# Patient Record
Sex: Male | Born: 1935
Health system: Southern US, Community
[De-identification: ages and names within clinical notes are randomized; demographics above are authoritative.]

## PROBLEM LIST (undated history)

## (undated) DIAGNOSIS — I10 Essential (primary) hypertension: Secondary | ICD-10-CM

## (undated) DIAGNOSIS — I2510 Atherosclerotic heart disease of native coronary artery without angina pectoris: Secondary | ICD-10-CM

## (undated) DIAGNOSIS — B029 Zoster without complications: Secondary | ICD-10-CM

## (undated) DIAGNOSIS — E785 Hyperlipidemia, unspecified: Secondary | ICD-10-CM

## (undated) DIAGNOSIS — R739 Hyperglycemia, unspecified: Secondary | ICD-10-CM

## (undated) DIAGNOSIS — E669 Obesity, unspecified: Secondary | ICD-10-CM

## (undated) HISTORY — DX: Hyperglycemia, unspecified: R73.9

## (undated) HISTORY — PX: KNEE ARTHROSCOPY: SUR90

## (undated) HISTORY — PX: OTHER SURGICAL HISTORY: SHX169

## (undated) HISTORY — PX: APPENDECTOMY: SHX54

---

## 2000-02-03 ENCOUNTER — Encounter: Admission: RE | Admit: 2000-02-03 | Discharge: 2000-02-03 | Payer: Self-pay | Admitting: General Surgery

## 2000-02-03 ENCOUNTER — Encounter (HOSPITAL_BASED_OUTPATIENT_CLINIC_OR_DEPARTMENT_OTHER): Payer: Self-pay | Admitting: General Surgery

## 2000-02-04 ENCOUNTER — Encounter (INDEPENDENT_AMBULATORY_CARE_PROVIDER_SITE_OTHER): Payer: Self-pay | Admitting: Specialist

## 2000-02-04 ENCOUNTER — Ambulatory Visit (HOSPITAL_BASED_OUTPATIENT_CLINIC_OR_DEPARTMENT_OTHER): Admission: RE | Admit: 2000-02-04 | Discharge: 2000-02-04 | Payer: Self-pay | Admitting: General Surgery

## 2001-07-30 ENCOUNTER — Encounter: Payer: Self-pay | Admitting: Orthopedic Surgery

## 2001-08-06 ENCOUNTER — Inpatient Hospital Stay (HOSPITAL_COMMUNITY): Admission: RE | Admit: 2001-08-06 | Discharge: 2001-08-10 | Payer: Self-pay | Admitting: Orthopedic Surgery

## 2001-08-06 ENCOUNTER — Encounter: Payer: Self-pay | Admitting: Orthopedic Surgery

## 2002-07-23 ENCOUNTER — Emergency Department (HOSPITAL_COMMUNITY): Admission: EM | Admit: 2002-07-23 | Discharge: 2002-07-23 | Payer: Self-pay | Admitting: Emergency Medicine

## 2003-04-26 DIAGNOSIS — B029 Zoster without complications: Secondary | ICD-10-CM

## 2003-04-26 HISTORY — DX: Zoster without complications: B02.9

## 2003-04-26 HISTORY — PX: TOTAL HIP ARTHROPLASTY: SHX124

## 2005-07-27 ENCOUNTER — Ambulatory Visit: Payer: Self-pay | Admitting: Internal Medicine

## 2005-08-17 ENCOUNTER — Ambulatory Visit: Payer: Self-pay | Admitting: Internal Medicine

## 2005-09-02 ENCOUNTER — Encounter: Admission: RE | Admit: 2005-09-02 | Discharge: 2005-09-02 | Payer: Self-pay | Admitting: Internal Medicine

## 2005-09-02 ENCOUNTER — Ambulatory Visit: Payer: Self-pay | Admitting: Internal Medicine

## 2005-09-27 ENCOUNTER — Ambulatory Visit: Payer: Self-pay | Admitting: Internal Medicine

## 2005-10-12 ENCOUNTER — Ambulatory Visit: Payer: Self-pay | Admitting: Internal Medicine

## 2006-01-05 ENCOUNTER — Ambulatory Visit: Payer: Self-pay | Admitting: Internal Medicine

## 2006-06-22 DIAGNOSIS — I10 Essential (primary) hypertension: Secondary | ICD-10-CM | POA: Insufficient documentation

## 2006-06-22 DIAGNOSIS — B0229 Other postherpetic nervous system involvement: Secondary | ICD-10-CM

## 2006-10-10 ENCOUNTER — Encounter: Payer: Self-pay | Admitting: Internal Medicine

## 2007-01-05 ENCOUNTER — Encounter (INDEPENDENT_AMBULATORY_CARE_PROVIDER_SITE_OTHER): Payer: Self-pay | Admitting: *Deleted

## 2007-03-16 ENCOUNTER — Telehealth: Payer: Self-pay | Admitting: Internal Medicine

## 2007-03-23 ENCOUNTER — Ambulatory Visit: Payer: Self-pay | Admitting: Internal Medicine

## 2007-03-23 DIAGNOSIS — N400 Enlarged prostate without lower urinary tract symptoms: Secondary | ICD-10-CM

## 2007-03-23 DIAGNOSIS — E782 Mixed hyperlipidemia: Secondary | ICD-10-CM | POA: Insufficient documentation

## 2007-03-27 ENCOUNTER — Encounter: Payer: Self-pay | Admitting: Internal Medicine

## 2007-04-02 ENCOUNTER — Encounter (INDEPENDENT_AMBULATORY_CARE_PROVIDER_SITE_OTHER): Payer: Self-pay | Admitting: *Deleted

## 2007-05-29 ENCOUNTER — Encounter: Payer: Self-pay | Admitting: Internal Medicine

## 2007-10-09 ENCOUNTER — Encounter: Payer: Self-pay | Admitting: Internal Medicine

## 2008-05-16 ENCOUNTER — Ambulatory Visit: Payer: Self-pay | Admitting: Family Medicine

## 2008-06-24 ENCOUNTER — Telehealth (INDEPENDENT_AMBULATORY_CARE_PROVIDER_SITE_OTHER): Payer: Self-pay | Admitting: *Deleted

## 2008-07-22 ENCOUNTER — Telehealth (INDEPENDENT_AMBULATORY_CARE_PROVIDER_SITE_OTHER): Payer: Self-pay | Admitting: *Deleted

## 2008-07-24 ENCOUNTER — Ambulatory Visit: Payer: Self-pay | Admitting: Internal Medicine

## 2008-07-24 LAB — CONVERTED CEMR LAB
Cholesterol, target level: 200 mg/dL
HDL goal, serum: 40 mg/dL
LDL Goal: 100 mg/dL

## 2009-08-03 ENCOUNTER — Telehealth (INDEPENDENT_AMBULATORY_CARE_PROVIDER_SITE_OTHER): Payer: Self-pay | Admitting: *Deleted

## 2009-12-22 ENCOUNTER — Ambulatory Visit: Payer: Self-pay | Admitting: Family Medicine

## 2009-12-22 DIAGNOSIS — M79609 Pain in unspecified limb: Secondary | ICD-10-CM

## 2009-12-22 DIAGNOSIS — L089 Local infection of the skin and subcutaneous tissue, unspecified: Secondary | ICD-10-CM | POA: Insufficient documentation

## 2009-12-22 DIAGNOSIS — B351 Tinea unguium: Secondary | ICD-10-CM

## 2009-12-23 LAB — CONVERTED CEMR LAB
BUN: 16 mg/dL (ref 6–23)
GFR calc non Af Amer: 122.82 mL/min (ref >60)
Sodium: 139 meq/L (ref 135–145)
Uric Acid, Serum: 5.6 mg/dL (ref 4.0–7.8)

## 2009-12-30 ENCOUNTER — Telehealth (INDEPENDENT_AMBULATORY_CARE_PROVIDER_SITE_OTHER): Payer: Self-pay | Admitting: *Deleted

## 2010-01-22 ENCOUNTER — Encounter: Payer: Self-pay | Admitting: Internal Medicine

## 2010-05-23 LAB — CONVERTED CEMR LAB
ALT: 24 units/L (ref 0–53)
HDL goal, serum: 40 mg/dL
Hgb A1c MFr Bld: 5.8 % (ref 4.6–6.0)
PSA: 2.52 ng/mL (ref 0.10–4.00)
Total Bilirubin: 0.8 mg/dL (ref 0.3–1.2)

## 2010-05-25 NOTE — Progress Notes (Signed)
Summary: NEEDS METOPROLOL ER 50 MG 90 DAY PRESCRIPTION  Phone Note Call from Patient Call back at Work Phone [phone]   Caller: Patient Summary of Call: PATIENT NEEDS NEW 90 DAY PRESCRIPTION PLUS REFILLS FOR METOPROLOL ER 50 MG CALLED INTO [facility] [address] IN [place]--  PLEASE CALL IN AS SOON AS POSSIBLE ---HE IS OUT AS OF TODAY Initial call taken by: Jerolyn Shin,  August 03, 2009 10:26 AM  Follow-up for Phone Call        Called  the pharmacy and left auth to refill med on voice mail, I indicated the we ok'd me on 07/27/09 and indicated patient is due for an OV. If not on file to fill. Follow-up by: Shonna Chock,  August 03, 2009 2:02 PM

## 2010-05-25 NOTE — Consult Note (Signed)
Summary: [facility]   Imported By: Lanelle Bal 02/09/2010 16:25:56  _____________________________________________________________________  External Attachment:    Type:   Image     Comment:   External Document

## 2010-05-25 NOTE — Assessment & Plan Note (Signed)
Summary: followup on bp med refill/alr   Vital Signs:  Patient profile:   78 year old male Weight:      271.2 pounds Pulse rate:   64 / minute Resp:     18 per minute BP sitting:   140 / 82  (left arm) Cuff size:   large  Vitals Entered By: Shonna Chock (July 24, 2008 4:04 PM) CC: Hypertension Management, Lipid Management Comments FOLLOW-UP ON B/P MEDS AND 90 DAY RX   CC:  Hypertension Management and Lipid Management.  History of Present Illness: He is not checking BP @ home. Last OV 03/22/2009 ; BP was 130/80. NMR reviewed ; LDL was 207 with > 255 risk. He did not keep colonoscopy referral; "I just don't want to have it" (SOC reviewed)  Hypertension History:      He complains of neurologic problems, but denies headache, chest pain, palpitations, dyspnea with exertion, orthopnea, PND, peripheral edema, visual symptoms, syncope, and side effects from treatment.  He notes no problems with any antihypertensive medication side effects.        Positive major cardiovascular risk factors include male age 4 years old or older, hyperlipidemia, and hypertension.  Negative major cardiovascular risk factors include no history of diabetes, negative family history for ischemic heart disease, and non-tobacco-user status.        Further assessment for target organ damage reveals no history of ASHD, stroke/TIA, or peripheral vascular disease.    Lipid Management History:      Positive NCEP/ATP III risk factors include male age 45 years old or older and hypertension.  Negative NCEP/ATP III risk factors include non-diabetic, no family history for ischemic heart disease, non-tobacco-user status, no ASHD (atherosclerotic heart disease), no prior stroke/TIA, no peripheral vascular disease, and no history of aortic aneurysm.      Preventive Screening-Counseling & Management     Smoking Status: never     Does Patient Exercise: no  Allergies (verified): No Known Drug Allergies  Past History:  Past  Medical History:    Hyperlipidemia    Hypertension    Post herpetic neuralgia  Past Surgical History:    RT HIP REPLACEMENT 07/2001    SEBACEOUS CYST SHOULDER( RUPTURED SUBCUTANEOUSLY)    TONSILS/ADENOIDS    Appendectomy  Family History:    Father: HTN,heart disease ONSET IN 23'S    Mother: DIED 2003/09/29 @ 70    Siblings: neg  Social History:    Occupation: Control and instrumentation engineer    Married    Never Smoked    Alcohol use-yes    Regular exercise-no; no diet    Does Patient Exercise:  no  Review of Systems Eyes:  Denies blurring, double vision, and vision loss-both eyes. ENT:  Denies difficulty swallowing and hoarseness. GI:  Denies abdominal pain, bloody stools, dark tarry stools, and indigestion. Neuro:  Denies disturbances in coordination, numbness, poor balance, and tingling; Occa aching OS @  site of distribution of shingles 2003/09/29; Lyrica of no benefit.  Physical Exam  General:  in no acute distress; alert,appropriate and cooperative throughout examination Neck:  No deformities, masses, or tenderness noted. Lungs:  Normal respiratory effort, chest expands symmetrically. Lungs are clear to auscultation, no crackles or wheezes. Heart:  Normal rate and regular rhythm. S1 and S2 normal without gallop, murmur, click, rub. S4 with slurring Abdomen:  Bowel sounds positive,abdomen soft and non-tender without masses, organomegaly or hernias noted. Pulses:  R and L carotid,radial,dorsalis pedis and posterior tibial pulses are full and equal bilaterally  Extremities:  No clubbing, cyanosis,. trace left pedal edema and trace right pedal edema.   Neurologic:  alert & oriented X3 and DTRs symmetrical and normal.   Skin:  Intact without suspicious lesions or rashes. Benign nevi Cervical Nodes:  No lymphadenopathy noted Axillary Nodes:  No palpable lymphadenopathy Psych:  memory intact for recent and remote, normally interactive, and good eye contact.     Impression & Recommendations:  Problem  # 1:  HYPERTENSION (ICD-401.9)  His updated medication list for this problem includes:    Toprol Xl 50 Mg Tb24 (Metoprolol succinate) .Marland Kitchen... 1/2 tab once daily    Hydrochlorothiazide 12.5 Mg Caps (Hydrochlorothiazide) .Marland Kitchen... 1 once daily  Problem # 2:  HYPERLIPIDEMIA (ICD-272.2)  Problem # 3:  POSTHERPETIC NEURALGIA (ICD-053.19)  Complete Medication List: 1)  Toprol Xl 50 Mg Tb24 (Metoprolol succinate) .... 1/2 tab once daily 2)  Baby Asa  .... Once daily 3)  Multivitamin  .... Once daily 4)  Gabapentin 300 Mg Caps (Gabapentin) .Marland Kitchen.. 1 -2 at bedtime for herpetic neuralgia 5)  Hydrochlorothiazide 12.5 Mg Caps (Hydrochlorothiazide) .Marland Kitchen.. 1 once daily  Hypertension Assessment/Plan:      The patient's hypertensive risk group is category B: At least one risk factor (excluding diabetes) with no target organ damage.  Today's blood pressure is 140/82.    Lipid Assessment/Plan:      Based on NCEP/ATP III, the patient's risk factor category is "2 or more risk factors and a calculated 10 year CAD risk of > 20%".  The patient's lipid goals have been set as follows: Total cholesterol goal is 200; LDL cholesterol goal is 100; HDL cholesterol goal is 40; Triglyceride goal is 150.  His LDL cholesterol goal has not been met.  Secondary causes for hyperlipidemia have been ruled out.  He has been counseled on adjunctive measures for lowering his cholesterol and has been provided with dietary instructions.    Patient Instructions: 1)  Check your Blood Pressure regularly. If it is above:  135/85 ON AVERAGE you should make an appointment. Please consider meds for LDL risk, your LDL goal = < 100 (last value was 207). Please consider fasting labs: lipids, A1c, BUN,creat,K+( 272.4,277.7,401.9). Consider Shingles shot Prescriptions: TOPROL XL 50 MG TB24 (METOPROLOL SUCCINATE) 1/2 tab once daily  #90 x 1   Entered and Authorized by:   Marga Melnick MD   Signed by:   Marga Melnick MD on 07/24/2008   Method used:    Print then Give to Patient   RxID:   [id number] HYDROCHLOROTHIAZIDE 12.5 MG CAPS (HYDROCHLOROTHIAZIDE) 1 once daily  #90 x 1   Entered and Authorized by:   Marga Melnick MD   Signed by:   Marga Melnick MD on 07/24/2008   Method used:   Print then Give to Patient   RxID:   [id number] GABAPENTIN 300 MG CAPS (GABAPENTIN) 1 -2 at bedtime for herpetic neuralgia  #180 x 1   Entered and Authorized by:   Marga Melnick MD   Signed by:   Marga Melnick MD on 07/24/2008   Method used:   Print then Give to Patient   RxID:   [id number]

## 2010-05-25 NOTE — Progress Notes (Signed)
Summary: refill 30 day supply  Phone Note Refill Request Message from:  Patient on December 30, 2009 10:12 AM  Refills Requested: Medication #1:  GABAPENTIN 300 MG CAPS 1 -2 at bedtime for herpetic neuralgia [facility] [address] - patient got rx for 90 day supply - but this is not on [facility] 4.00 list - patient doesnt want to get filled - wants rx for 30 day supply   Initial call taken by: Okey Regal [place],  December 30, 2009 10:14 AM    Prescriptions: GABAPENTIN 300 MG CAPS (GABAPENTIN) 1 -2 at bedtime for herpetic neuralgia  #30 x 0   Entered by:   Shonna Chock CMA   Authorized by:   Marga Melnick MD   Signed by:   Shonna Chock CMA on 12/30/2009   Method used:   Electronically to        [facility]  [phone]* (retail)       [address]       [place], [place]  [address]       Ph: [phone] or [phone]       Fax: [phone]   RxID:   [id number]

## 2010-05-25 NOTE — Assessment & Plan Note (Signed)
Summary: infected toe/kn   Vital Signs:  Patient profile:   77 year old male Weight:      269 pounds Temp:     98.7 degrees F oral Pulse rate:   64 / minute Pulse rhythm:   regular BP sitting:   136 / 92  (right arm)  Vitals Entered By: Almeta Monas CMA Duncan Dull) (December 22, 2009 11:30 AM) CC: c/o infected right big toe   History of Present Illness: Pt here c/o red and painful R toe.   No hx gout.     Current Medications (verified): 1)  Toprol Xl 50 Mg Tb24 (Metoprolol Succinate) .... 1/2 Tab Once Daily. Appointment Due 2)  Alinda Money .... Once Daily 3)  Multivitamin .... Once Daily 4)  Gabapentin 300 Mg Caps (Gabapentin) .Marland Kitchen.. 1 -2 At Bedtime For Herpetic Neuralgia 5)  Hydrochlorothiazide 12.5 Mg Caps (Hydrochlorothiazide) .Marland Kitchen.. 1 Once Daily 6)  Keflex 500 Mg Caps (Cephalexin) .Marland Kitchen.. 1 By Mouth Two Times A Day  Allergies (verified): No Known Drug Allergies  Past History:  Past medical, surgical, family and social histories (including risk factors) reviewed for relevance to current acute and chronic problems.  Past Medical History: Reviewed history from 07/24/2008 and no changes required. Hyperlipidemia Hypertension Post herpetic neuralgia  Past Surgical History: Reviewed history from 07/24/2008 and no changes required. RT HIP REPLACEMENT 07/2001 SEBACEOUS CYST SHOULDER( RUPTURED SUBCUTANEOUSLY) TONSILS/ADENOIDS Appendectomy  Review of Systems      See HPI  Impression & Recommendations:  Problem # 1:  INFECTION, TOE (ICD-686.9) abx written soak in H202 check uric acid---r/o gout Orders: Podiatry Referral Personal assistant) Prescription Created Electronically [phone])  Problem # 2:  ONYCHOMYCOSIS, TOENAILS (ICD-110.1)  Orders: Podiatry Referral (Podiatry)  Discussed nail care and medication treatment options.   Complete Medication List: 1)  Toprol Xl 50 Mg Tb24 (Metoprolol succinate) .... 1/2 tab once daily. appointment due 2)  Baby Asa  .... Once daily 3)   Multivitamin  .... Once daily 4)  Gabapentin 300 Mg Caps (Gabapentin) .Marland Kitchen.. 1 -2 at bedtime for herpetic neuralgia 5)  Hydrochlorothiazide 12.5 Mg Caps (Hydrochlorothiazide) .Marland Kitchen.. 1 once daily 6)  Keflex 500 Mg Caps (Cephalexin) .Marland Kitchen.. 1 by mouth two times a day  Other Orders: Venipuncture (60454) TLB-BMP (Basic Metabolic Panel-BMET) (80048-METABOL) TLB-Uric Acid, Blood (84550-URIC)  Physical Exam  General:  Well-developed,well-nourished,in no acute distress; alert,appropriate and cooperative throughout examination Msk:  R toe-- + errythema, tenderness to palpation nail thick and yellow ,  no drainage Psych:  Cognition and judgment appear intact. Alert and cooperative with normal attention span and concentration. No apparent delusions, illusions, hallucinations  ]Prescriptions: KEFLEX 500 MG CAPS (CEPHALEXIN) 1 by mouth two times a day  #20 x 0   Entered and Authorized by:   Loreen Freud DO   Signed by:   Loreen Freud DO on 12/22/2009   Method used:   Electronically to        [facility]. [phone]* (retail)       [address]       [place], [place]  [address]       Ph: [phone]       Fax: [phone]   RxID:   [id number]

## 2010-06-09 ENCOUNTER — Encounter: Payer: Self-pay | Admitting: Internal Medicine

## 2010-06-09 ENCOUNTER — Ambulatory Visit (INDEPENDENT_AMBULATORY_CARE_PROVIDER_SITE_OTHER): Payer: Medicare Other | Admitting: Internal Medicine

## 2010-06-09 DIAGNOSIS — I10 Essential (primary) hypertension: Secondary | ICD-10-CM

## 2010-06-09 DIAGNOSIS — J069 Acute upper respiratory infection, unspecified: Secondary | ICD-10-CM | POA: Insufficient documentation

## 2010-06-09 DIAGNOSIS — J209 Acute bronchitis, unspecified: Secondary | ICD-10-CM

## 2010-06-16 NOTE — Assessment & Plan Note (Signed)
Summary: cough,drainage/cbs   Vital Signs:  Patient profile:   77 year old male Weight:      270 pounds O2 Sat:      95 % on Room air Temp:     98.2 degrees F oral Pulse rate:   73 / minute Resp:     14 per minute BP sitting:   150 / 90  (left arm)  Vitals Entered By: Jeremy Johann CMA (June 09, 2010 12:47 PM)  O2 Flow:  Room air CC: dry cough, head cold, URI symptoms   Primary Care Provider:  hopp  CC:  dry cough, head cold, and URI symptoms.  History of Present Illness:    Onset 06/01/2010 as head congestion, rhinitis & NP cough; he now  reports nasal congestion and  persistent dry cough, but denies purulent nasal discharge and earache.  The patient denies fever( but having night sweats), dyspnea, and wheezing.  The patient also reports  frontal headache.  Risk factors for Strep sinusitis include bilateral facial pain.  The patient denies the following risk factors for Strep sinusitis: tooth pain and tender adenopathy.Rib pain & disturbed sleep from cough.  Rx: Coricidin HP. No PMH of asthma; never smoked .    See BP; he is not on HCTZ as  per  in Med List.  The patient denies lightheadedness, urinary frequency, edema, and fatigue.  The patient denies the following associated symptoms: chest pain, chest pressure, palpitations, and syncope.  Adjunctive measures currently used by the patient include salt restriction. BP not checked @ home, it was 140/84 @ [facility].   Current Medications (verified): 1)  Toprol Xl 50 Mg Tb24 (Metoprolol Succinate) .... 1/2 Tab Once Daily. Appointment Due 2)  Alinda Money .... Once Daily 3)  Multivitamin .... Once Daily  Allergies (verified): No Known Drug Allergies  Physical Exam  General:  well-nourished,in no acute distress; alert,appropriate and cooperative throughout examination Ears:  External ear exam shows no significant lesions or deformities.  Otoscopic examination reveals clear canals, tympanic membranes are intact bilaterally without  bulging, retraction, inflammation or discharge. Hearing is grossly normal bilaterally. Nose:  External nasal examination shows no deformity or inflammation. Nasal mucosa are  dry without lesions or exudates. Mouth:  Oral mucosa and oropharynx without lesions or exudates.  Teeth in good repair. Lungs:  Normal respiratory effort, chest expands symmetrically. Lungs are clear to auscultation, no crackles or wheezes. Heart:  Normal rate and regular rhythm. S1 and S2 normal without gallop, murmur, click, rub .S4 Pulses:  R and L carotid,radial,dorsalis pedis and posterior tibial pulses are full and equal bilaterally Extremities:  trace left  & R pedal edema @ sock line.   Neurologic:  alert & oriented X3.   Cervical Nodes:  No lymphadenopathy noted Axillary Nodes:  No palpable lymphadenopathy Psych:  memory intact for recent and remote, normally interactive, and good eye contact.     Impression & Recommendations:  Problem # 1:  BRONCHITIS-ACUTE (ICD-466.0)  His updated medication list for this problem includes:    Amoxicillin 500 Mg Caps (Amoxicillin) .Marland Kitchen... 1 three times a day    Hydromet 5-1.5 Mg/76ml Syrp (Hydrocodone-homatropine) .Marland Kitchen... 1 tsp every 6 hrs as needed cough  Orders: Prescription Created Electronically [phone])  Problem # 2:  URI (ICD-465.9)  His updated medication list for this problem includes:    Hydromet 5-1.5 Mg/57ml Syrp (Hydrocodone-homatropine) .Marland Kitchen... 1 tsp every 6 hrs as needed cough  Problem # 3:  HYPERTENSION (ICD-401.9)  suboptimal control ; risk  discussed The following medications were removed from the medication list:    Hydrochlorothiazide 12.5 Mg Caps (Hydrochlorothiazide) .Marland Kitchen... 1 once daily His updated medication list for this problem includes:    Toprol Xl 50 Mg Tb24 (Metoprolol succinate) .Marland Kitchen... 1/2 tab once daily. appointment due    Hydrochlorothiazide 12.5 Mg Caps (Hydrochlorothiazide) .Marland Kitchen... 1 once daily  Orders: Prescription Created Electronically  [phone])  Complete Medication List: 1)  Toprol Xl 50 Mg Tb24 (Metoprolol succinate) .... 1/2 tab once daily. appointment due 2)  Baby Asa  .... Once daily 3)  Multivitamin  .... Once daily 4)  Hydrochlorothiazide 12.5 Mg Caps (Hydrochlorothiazide) .Marland Kitchen.. 1 once daily 5)  Amoxicillin 500 Mg Caps (Amoxicillin) .Marland Kitchen.. 1 three times a day 6)  Hydromet 5-1.5 Mg/46ml Syrp (Hydrocodone-homatropine) .Marland Kitchen.. 1 tsp every 6 hrs as needed cough  Patient Instructions: 1)  Neti pot once daily as needed for head congestion. 2)  Drink as much NON dairy fluid as you can tolerate for the next few days. 3)  Check your Blood Pressure regularly. If it is above: 135/85 ON AVERAGE  you should make an appointment. Prescriptions: HYDROMET 5-1.5 MG/5ML SYRP (HYDROCODONE-HOMATROPINE) 1 tsp every 6 hrs as needed cough  #120 cc x 0   Entered and Authorized by:   Marga Melnick MD   Signed by:   Marga Melnick MD on 06/09/2010   Method used:   Printed then faxed to ...       [facility]  [place]. [phone]* (retail)       [address]       [place], [place]  [address]       Ph: [phone]       Fax: [phone]   RxID:   [id number] AMOXICILLIN 500 MG CAPS (AMOXICILLIN) 1 three times a day  #30 x 0   Entered and Authorized by:   Marga Melnick MD   Signed by:   Marga Melnick MD on 06/09/2010   Method used:   Electronically to        [facility]. [phone]* (retail)       [address]       [place], [place]  [address]       Ph: [phone]       Fax: [phone]   RxID:   [id number] HYDROCHLOROTHIAZIDE 12.5 MG CAPS (HYDROCHLOROTHIAZIDE) 1 once daily  #90 x 1   Entered and Authorized by:   Marga Melnick MD   Signed by:   Marga Melnick MD on 06/09/2010   Method used:   Electronically to        [facility]. [phone]* (retail)       [address]       [place], [place]  [address]       Ph: [phone]       Fax:  [phone]   RxID:   [id number]    Orders Added: 1)  Est. Patient Level IV [71245] 2)  Prescription Created Electronically [id number]

## 2010-07-27 ENCOUNTER — Other Ambulatory Visit: Payer: Self-pay | Admitting: *Deleted

## 2010-07-27 MED ORDER — METOPROLOL TARTRATE 25 MG PO TABS: 25.0000 mg | ORAL_TABLET | Freq: Two times a day (BID) | ORAL | Status: DC

## 2010-07-27 NOTE — Telephone Encounter (Signed)
Aware rx sent to pharmacy 

## 2010-08-16 ENCOUNTER — Telehealth: Payer: Self-pay | Admitting: Internal Medicine

## 2010-08-16 MED ORDER — METOPROLOL TARTRATE 25 MG PO TABS: 25.0000 mg | ORAL_TABLET | Freq: Two times a day (BID) | ORAL | Status: DC

## 2010-08-16 NOTE — Telephone Encounter (Signed)
RX sent to the pharmacy, I reviewed previous refill and it looks like rx was sent to [facility] [address] [place]. RX is now at the correct pharmacy. Left message on voicemail informing patient rx sent to the pharmacy

## 2010-08-16 NOTE — Telephone Encounter (Signed)
Patient's wife is calling to get a 90 day prescription for the "non-extended release" for Metoprolol---says he was taking Metoprolol XR 50mg    and it costs $165.00---wants to get the same 90 day  medication, BUT NOT the extended release because it will be a significant cost savings---  wife says she has been calling about this request for a week and patient is now out of medication as of today---please send new prescription to [facility] [address]  Because she has been calling about this and making trips to [place], she would like [place] to call her when prescription is called in

## 2010-08-17 ENCOUNTER — Other Ambulatory Visit: Payer: Self-pay | Admitting: *Deleted

## 2010-09-08 ENCOUNTER — Other Ambulatory Visit: Payer: Self-pay | Admitting: Internal Medicine

## 2010-09-08 NOTE — Telephone Encounter (Signed)
Patient will need OV in order to refill antibiotic, this is not a refillable medication

## 2010-09-10 NOTE — Op Note (Signed)
[facility]  Patient:    Wyatt Mason, GALLERY Visit Number: [id number] MRN: [id number]          Service Type: SUR Location: [id number] Attending Physician:  Loanne Drilling Dictated by:   Ollen Gross, M.D. Proc. Date: 08/06/01 Admit Date:  08/06/2001                             Operative Report  PREOPERATIVE DIAGNOSIS:  Osteoarthritis, right hip.  POSTOPERATIVE DIAGNOSIS:  Osteoarthritis, right hip.  PROCEDURE:  Right total hip arthroplasty.  SURGEON:  Ollen Gross, M.D.  ASSISTANT:  Marcie Bal. Troncale, P.A.C.  ANESTHESIA:  General.  ESTIMATED BLOOD LOSS:  900  DRAINS:  Hemovac x1.  COMPLICATIONS:  None.  CONDITION:  Stable to recovery.  BRIEF CLINICAL NOTE:  Mr. Odea is a 77 year old male with severe osteoarthritis of his right hip with pain refractory to nonoperative management. He presents now for a right total hip arthroplasty after failure of nonoperative management.  DESCRIPTION OF PROCEDURE:  After successful administration of general anesthetic, the patient was placed in the left lateral decubitus position with the right side up and held with the hip positioner. The right lower extremity was isolated from his perineum was plastic drapes and prepped and draped in the usual sterile fashion. A standard posterolateral incision was made with a 10 bladder through the subcutaneous tissue to the level of the fascia lata which was incised in line with the skin incision. The sciatic nerve was palpated and protected and short external rotators isolated off the femur. The capsulectomy is then performed and the hip dislocated. The center of the femoral head is marked and trial prosthesis placed such that the center of the trial head corresponds to the center of his native femoral head. The femur is then retracted anteriorly and acetabular exposure obtained.  The soft tissue is cleared from the acetabulum and osteophytes  removed. Acetabular reaming is initiated with 50 coursing in increments of 2 from 51 through 59. A 60 mm pinnacle acetabular shell is placed in approximately 40 degrees of abduction, 20 degrees of anteversion. The shell has an excellent fit and is then supported with two additional dome screws with excellent purchase. This was then irrigated and a trial 32 plus 4 neutral liner is placed.  The femur is then prepared first with a canal finder and then the canal is irrigated. Axial reaming is performed with a 15.5 mm proximal reaming up to a 44F and the sleeve machined to a small. A 44F small sleeve is placed. The trial 20 x 15 stem with a 36 plus 8 neck is placed. It was placed in approximately 20 degrees of anteversion. The trial 32 plus 0 head is placed and it felt as though there was too much soft tissue laxity, thus I went with a 32 plus 6 head. This tightened up the soft tissues very nicely. He had full extension, full external rotation, 70 degrees flexion, 40 degrees adduction, 90 degrees internal rotation and 90 degrees flexion and 70 degrees internal rotation. All trials were then removed and the permanent apex hole eliminator placed into the acetabular shell. Subsequently the 32 mm neutral plus 4 marathon liner is placed into the shell. On the femur, the 44F large sleeve is placed and the 20 x 15 stem with a 36 plus 8 neck is placed and a 32 plus 6 head is placed. The hip is reduced with the  same stability perimeters. The wound was copiously irrigated with antibiotic solution and short external rotators reattached to the femur through drill holes. The fascia lata was closed over a Hemovac drain with interrupted #1 Vicryl, the subcu closed with interrupted #1 and interrupted 2-0 Vicryl, subcuticular with running 4-0 monocryl. The incision was cleaned and dried and Steri-Strips and a bulky sterile dressing applied. The patient was awakened and transported to recovery in stable  condition. Dictated by:   Ollen Gross, M.D. Attending Physician:  Loanne Drilling DD:  08/06/01 TD:  08/06/01 Job: [id number]

## 2010-09-10 NOTE — Consult Note (Signed)
[facility]  Patient:    Wyatt Mason Visit Number: [id number] MRN: [id number]          Service Type: Attending:  Titus Dubin. Alwyn Mason, M.D. [facility] Dictated by:   Wyatt Mason, M.D. [facility] Proc. Date: 07/24/01   CC:         Wyatt Mason, M.D.   Consultation Report  DATE OF BIRTH:  Sep 08, 2032  SOCIAL SECURITY NUMBER:  [id number].  REASON FOR CONSULTATION:  Wyatt Mason is a 77 year old white male who was seen on 07/23/01, referred by Dr. Ollen Mason for preoperative clearance.  A right hip replacement is scheduled for August 06, 2001, at [facility].  He has had hip pain for 10 to 12 years, aggravated by his driving of over 24,401 miles per year.  The pain has incapacitated him to the point that he is sedentary, resulting in some weight gain.  He was previously on aspirin one to two times per day without benefit.  PAST MEDICAL HISTORY: 1. Epidermal inclusion cyst removal from the shoulder after it ruptured. 2. Tonsillectomy and adenoidectomy. 3. Appendectomy.  FAMILY HISTORY:  Myocardial infarction, hypertension in his father. Apparently his father had heart disease beginning in his 74s, suggesting congenital heart disease.  There is no family history of cancer or diabetes or stroke.  SOCIAL HISTORY:  He has never smoked.  He drinks occasionally.  As stated, he is sedentary due to his hip pain.  CURRENT MEDICATIONS:  He is on no prescription drugs at this time, but does take multivitamins.  ALLERGIES:  No known drug allergies.  REVIEW OF SYSTEMS:  Reveals some post nasal drainage.  He checked his blood pressures at home and they range from 135 to 140/85.  He denies any epistaxis, headache, chest pain, palpitations, or shortness of breath.  He has had no preventive health care since 1999.  PHYSICAL EXAMINATION:  GENERAL:  He is 6 feet 1.5 inches, and weighs 248 pounds. VITAL SIGNS:  Temperature was 97.5, pulse 68, respiratory  rate 17, and blood pressure 150/86.  HEENT:  He does have ______ narrowing on fundal exam.  Pattern alopecia is present.  The otolaryngological examination is unremarkable.  NECK:  Thyroid is small without nodularity.  CHEST:  Clear.  He has a grade 1/2 systolic murmur.  No bruits, aneurysms, or pulsatile clicks were noted.  He had no edema.  ABDOMEN:  Protuberant.  Fecal occult blood testing was negative.  No lymphadenopathy or organomegaly.  GENITOURINARY:  He does have a hydrocele on the left.  The prostate is normal to palpation.  EXTREMITIES:  He had pain with walking.  The pain was exacerbated by any elevation of the right lower extremity or rotation of the hip.  SKIN:  Clear.  Because of hypertension, Toprol XL 50 mg 1/2 daily was initiated.  In reviewing the old records, he has had significant hyperlipidemia, specifically, his total cholesterol was 269 with LDL of 185.  The HDL was protected at 64.  While hospitalized, I would recommend a nutrition consultation concerning lipid management.  Because of the hypertension, I have recommended preoperative BUN, creatinine, and potassium.  Fasting glucose would be recommended.  Additionally, PSA is recommended as he has had no lab followup since 1999.  EKG revealed no dysrhythmias, but did reveal a first degree block with a PR interval of 0.208.  No left ventricular hypertrophy noted.  A copy of this was given to him to take to Dr. Lequita Mason.  Based on these  findings, there is no contraindication to the surgery.  It is recommended that for completeness of other studies as recommended above be performed. Dictated by:   Wyatt Mason, M.D. [facility] Attending:  Titus Dubin. Alwyn Mason, M.D. [facility] DD:  07/24/01 TD:  07/24/01 Job: [id number]

## 2010-09-10 NOTE — Discharge Summary (Signed)
[facility]  Patient:    Wyatt Mason, Wyatt Mason Visit Number: [id number] MRN: [id number]          Service Type: SUR Location: 4W 0452 01 Attending Physician:  Loanne Drilling Dictated by:   Sammuel Cooper Mahar, P.A. Admit Date:  08/06/2001 Discharge Date: 08/10/2001                             Discharge Summary  DATE OF BIRTH:  [birth year]  ADMITTING DIAGNOSES: 1. Severe osteoarthritis of the right hip. 2. Hypertension. 3. Seasonal allergies.  DISCHARGE DIAGNOSES: 1. Status post right total hip arthroplasty. 2. Postoperative hemorrhagic anemia that was asymptomatic and did not require    transfusion. 3. Postoperative febrile illness that was resolving prior to discharge, likely    secondary to atelectasis. 4. Hypertension. 5. Seasonal allergies.  PROCEDURE:  Right total hip arthroplasty done by Dr. Ollen Gross with the assistance of Irena Cords, P.A.-C.  Anesthesia used was general.  CONSULTATIONS:  None.  BRIEF HISTORY:  The patient is a 77 year old man with an 8-10 year history of right hip pain that has gotten progressively worse and is mainly posterolateral and in the groin.  It does radiate down to his knee.  He states his pain is interfering with his activities of daily living prior to surgery, as well as his ability to do activities of joy.  He has gotten to the point where the pain was ______ in nature and was not acceptable.  He wanted to have operative intervention.  Risks and benefits were discussed with the patient.  He indicated understanding and wished to proceed.  LABORATORY DATA:  On July 30, 2001, CBC with differential was within normal limits.  Hemoglobin and hematocrit were monitored postoperatively.  They did hit a low of 8.5 and 24.7 on August 10, 2001; however, he was asymptomatic and did not require any blood transfusions throughout.  PT/INR and PTT from July 30, 2001 were within normal limits.  PT and INR were  monitored postoperatively while on Coumadin.  They were gradually increasing until discharge.  On August 10, 2001, they were 17.4 and 1.6, respectively.  Complete metabolic panel from July 30, 2001 was within normal limits.  Basic metabolic panel from April 15 and April 16:  Sodium was 132 on April 15 and returned to normal at 136 on April 16.  Glucose was 150 on April 15 and April 16 glucose was 150 and 130, respectively.  Calcium was 7.6 and 7.8, respectively. Otherwise, BMETs were within normal limits.  UA from July 30, 2001 was negative.  August 06, 2001 blood type O, Rh type positive, antibody screen negative.  X-rays from April 7 of the chest revealed negative chest reactive disease. Pelvis revealed marked degenerative changes of the right hip with almost complete obliteration of the articular space secondary to changes noted in the femoral head including a subchondral cyst.  There is some slight degenerative change in the left hip but much less striking than on the right.  Views of the right hip showed marked osteoarthritis of the right hip.  August 06, 2001 pelvis and right hip showed right total hip replacement felt to be in satisfactory position and alignment.  EKG:  No EKG seen on the chart.  HOSPITAL COURSE:  On August 06, 2001, the patient was admitted and taken to the operating room for the above-listed procedure.  He tolerated the procedure very well without  any intraoperative complications.  There was one Hemovac drain placed.  He was transferred to the recovery room in stable and improved condition.  Postoperatively, he was started on appropriate antibiotic course and completed this without any difficulty.  His diet was advanced to regular without any problems.  Bedside incentive spirometer was utilized q.1h. in attempt to prevent any pulmonary complications.  Neurovascular checks were instituted postoperatively and remained intact throughout his hospital stay.  Pain  control was initially gained utilizing a combination of PCA morphine as well as p.o. analgesics.  He was transitioned over to strictly p.o. analgesics from postoperative day #1 to postoperative day #2.  Pain control did remain adequate.  Physical therapy and occupational therapy were consulted to work with the patient on total hip protocol, as well as precautions.  He did very well with them on touchdown weightbearing limitations, gradually increasing to ambulating approximately 85 feet with only supervision prior to discharge.  He indicated and demonstrated understanding of his total hip precautions as well.  On postoperative day #1, the patients Hemovac drain was discontinued without any difficulty intact.  On postoperative day #2, the patients operative dressing was taken down to reveal a good-looking incision without any signs or symptoms of infection.  The patient did develop a temperature of 102.8 on postoperative day #2.  Incentive spirometer was ordered, encouraged, and increased.  Throughout the next two days, his temperature did gradually decrease and it was near normal prior to discharge.  The patients operative dressing was taken down on postoperative day #2 that revealed a good-looking incision without any signs or symptoms of infection.  Daily dressing changes were done thereafter and continued to look good without any problems.  By August 10, 2001, the patient was orthopedically stable and medically stable and ready for discharge to his home.  His vital signs were stable and the incision looked good without any signs or symptoms of infection.  He was neurovascularly intact.  Motor and function and sensation were intact.  DISCHARGE MEDICATIONS: 1. Percocet 5/325. 2. Robaxin 500 mg. 3. Coumadin dose per pharmacy. 4. Trinsicon.  ACTIVITY:  Touchdown weightbearing to the right lower extremity.  Total hip precautions, as well as protocol.  Home health physical therapy  and occupational therapy and nursing.  Daily dressing changes should be done.  The patient may shower on postoperative day #5 or when there is no drainage.   FOLLOWUP:  Followup two weeks postoperatively with Dr. Ollen Gross.  He was instructed to call for an appointment.  DISPOSITION:  The patient is being discharged to his home with home health services per [place].  Hemoglobin will be drawn at home on April 21 and the results will be faxed to Dr. Ollen Gross.  CONDITION ON DISCHARGE:  Stable and improved. Dictated by:   Sammuel Cooper. Mahar, P.A. Attending Physician:  Loanne Drilling DD:  08/16/01 TD:  08/16/01 Job: [phone] [id number]

## 2010-09-10 NOTE — H&P (Signed)
[facility]  Patient:    Wyatt Mason, Wyatt Mason Visit Number: [id number] MRN: [id number]          Service Type: Attending:  Ollen Gross, M.D. Dictated by:   Dorie Rank, P.A. Adm. Date:  07/30/01   CC:         Titus Dubin. Alwyn Ren, M.D. [facility]   History and Physical  DATE OF BIRTH:  1932-06-14.  CHIEF COMPLAINT:  Right hip pain.  HISTORY OF PRESENT ILLNESS:  The patient is a pleasant 77 year old male with a 8-10 year history of right hip pain that has gotten progressively worse.  It is mainly posterolateral and in the groin.  It does radiate down to his knee. He states that the pain does interfere with activities of daily living as well as the ability to do activities that he enjoys.  He is at a stage now where presented to the office stating that he would like to get something done.  PHYSICAL EXAMINATION:  He walks with an antalgic gait.  Range of motion was limited with flexion only to 80 degrees.  No internal rotation, 10 degrees or external rotation, and 20 degrees of abduction.  He had crepitance noted on range of motion.  Radiographs in the office from July 17, 2000, revealed severe bone-on-bone changes with shortening of approximately 3/8th of an inch compared to the office ______.  It was felt due to his ongoing pain diagnostic studies, physical examination, he will benefit from underlying a right total hip arthroplasty.  The risks and benefits of those procedures were discussed with the patient and he elected to proceed.  He obtained medical clearance from his medical doctor, Dr. Marga Melnick.  ALLERGIES:  No known drug allergies.  MEDICATIONS:  Toprol XL 25 mg 1 p.o. q. day.  PAST MEDICAL HISTORY:  Significant for osteoarthritis and hypertension.  He has otherwise been in relatively good health.  SOCIAL HISTORY:  The patient is married.  He is a self employed Engineer, materials.  He lives in a 2 level home with the bedroom being on  the first floor.  He plans for home health PT.  He is a nonsmoker.  He drinks approximately 1-3 drinks per week.  His wife and son will be available after surgery to care for him.  FAMILY HISTORY:  Father deceased age 71.  History of coronary artery disease and myocardial infarction.  Mother is living at age 64.  He states she is in good health with osteoarthritis.  PAST SURGICAL HISTORY: 1. Tonsillectomy as a child. 2. Appendectomy as a child. 3. Cyst removed from his left shoulder in October 2001.  REVIEW OF SYSTEMS:  General:   No fevers, chills, night sweats or bleeding tendencies.  He has had some sneezing related to his seasonal allergies. Pulmonary:  No shortness of breath, productive cough, hemoptysis. Cardiovascular:  No chest pain, angina, or orthopnea.  Endocrine:  No history of hyper or hypothyroidism.  No history of diabetes mellitus.  GI:  No nausea, vomiting, diarrhea, melena or constipation.  GU:  No hematuria, dysuria, or discharge.  Neurologic:  No seizures, headaches, or paralysis. Musculoskeletal:  Significant for right hip pain, otherwise negative.  PHYSICAL EXAMINATION:  GENERAL:  Alert, oriented, well-developed, well-nourished 77 year old male.  VITAL SIGNS:  Pulse 65, respirations 16, blood pressure 140/85.  HEENT:  Head is atraumatic, normocephalic.  Oropharynx is clear.  NECK:  Supple.  Negative for carotid bruits bilaterally.  CHEST:  Clear to auscultation bilaterally.  No  wheezes, rhonchis or rales.  BREASTS:  Not pertinent to present illness.  HEART:  S1, S2.  Negative for murmur, rub or gallop.  Regular in rate and rhythm.  ABDOMEN:  Soft, nontender, positive bowel sounds.  GENITOURINARY:  Not pertinent to present illness.  EXTREMITIES:  Please see physical examination for physical examination of the right hip.  SKIN:  Intact, no rashes or lesions present on examination.  PULSES:  Dorsalis pedis pulses 2+ and symmetrical.  Posterior  tibialis pulses 1+ and symmetrical.  PREOPERATIVE LABORATORY DATA AND X-RAYS:  Pending.  IMPRESSION: 1. Osteoarthritis right hip. 2. Hypertension. 3. Seasonal allergies.  PLAN:  The patient is scheduled for a right total hip arthroplasty with Dr. Trudee Grip.  His family medical doctor is Dr. Marga Melnick. Dictated by:   Dorie Rank, P.A. Attending:  Ollen Gross, M.D. DD:  07/30/01 TD:  07/30/01 Job: [id number]

## 2010-09-10 NOTE — Op Note (Signed)
[facility]  Patient:    RONNELL, CLINGER                      MRN: [id number] Proc. Date: 02/04/00 Adm. Date:  [id number] Attending:  Fortino Sic                           Operative Report  PREOPERATIVE DIAGNOSIS:  Large sebaceous cyst - back.  POSTOPERATIVE DIAGNOSIS:  Large sebaceous cyst - back.  OPERATION PERFORMED:  Excision large sebaceous cyst.  SURGEON:  Marnee Spring. Wiliam Ke, M.D.  ASSISTANT:  None.  ANESTHESIA:  Local MAC.  PROCEDURE:  The patient well-sedated, lying on his right side.  Skin of the back was prepped and draped in usual manner. Tissues were infiltrated with a mixture of Xylocaine and Marcaine anesthesia.  An excision of skin and sebaceous cyst was made approximately 6 cm x 3 cm down to the fascia of the back muscles.  Hemostasis was obtained with electrocautery current.  The defect was then closed with subcutaneous 3-0 Vicryl and vertical mattress sutures of 3-0 nylon.  Estimated blood loss was minimal.  The patient received no blood and left the operating room in satisfactory condition after sponge and needle counts were verified. DD:  02/04/00 TD:  02/05/00 Job: [id number]

## 2010-12-14 ENCOUNTER — Other Ambulatory Visit: Payer: Self-pay | Admitting: Internal Medicine

## 2011-01-28 ENCOUNTER — Other Ambulatory Visit: Payer: Self-pay

## 2011-01-28 MED ORDER — METOPROLOL TARTRATE 25 MG PO TABS: 25.0000 mg | ORAL_TABLET | Freq: Two times a day (BID) | ORAL | Status: DC

## 2011-05-04 ENCOUNTER — Encounter: Payer: Self-pay | Admitting: Internal Medicine

## 2011-05-04 ENCOUNTER — Ambulatory Visit (INDEPENDENT_AMBULATORY_CARE_PROVIDER_SITE_OTHER): Payer: Medicare Other | Admitting: Internal Medicine

## 2011-05-04 VITALS — BP 138/90 | HR 73 | Temp 98.1°F | Wt 277.6 lb

## 2011-05-04 DIAGNOSIS — R05 Cough: Secondary | ICD-10-CM

## 2011-05-04 DIAGNOSIS — M5412 Radiculopathy, cervical region: Secondary | ICD-10-CM

## 2011-05-04 DIAGNOSIS — R51 Headache: Secondary | ICD-10-CM

## 2011-05-04 MED ORDER — FLUTICASONE PROPIONATE 50 MCG/ACT NA SUSP: 1.0000 | Freq: Two times a day (BID) | NASAL | Status: DC | PRN

## 2011-05-04 MED ORDER — TRAMADOL HCL 50 MG PO TABS: 50.0000 mg | ORAL_TABLET | Freq: Four times a day (QID) | ORAL | Status: AC | PRN

## 2011-05-04 MED ORDER — MONTELUKAST SODIUM 10 MG PO TABS: 10.0000 mg | ORAL_TABLET | Freq: Every day | ORAL | Status: DC

## 2011-05-04 NOTE — Progress Notes (Signed)
Subjective:    Patient ID: Wyatt Mason, male    DOB: [birth year], 78 y.o.   MRN: [id number]  HPI  #1 COUGH: Onset/symptoms:04/23/11 as sneezing, watery eyes, rhinitis. Actually this has been a recurrent problem over the last 2-3 years Exposures (illness/environmental/extrinsic):no Progression of symptoms:to dry cough Treatments/response:none Present symptoms: Fever/chills/sweats:sweats @ night is chronic Frontal headache:L frontal aching pain is chronic , recurrent. Ophth appt 1/11 Facial pain:no Nasal purulence:no Sore throat:no Dental pain:L upper teeth Lymphadenopathy:no Wheezing/shortness of breath:no Cough/sputum/hemoptysis:no Pleuritic pain:no Past medical history: Seasonal allergies: Spring/asthma:no Smoking history:never  #2 NECK PAIN: Location: base L neck   Onset: 4-5 months ago  Severity: up to 4 Pain is described as: aching  Worse with: no factor    Better with: Aleve ? helps  Pain radiates to: to L shoulder   Impaired range of motion: no History of repetitive motion:  no History of overuse or hyperextension:  no  History of trauma:  no   Past history of similar problem:  no Associated symptoms Back Pain:  no  Numbness/tingling: no Weakness: no Red Flags Fever:  no Headache:  no Bowel/bladder dysfunction: no            Review of Systems He denies blurred vision, double vision or loss of vision. He also denies any discharge from the eyes     Objective:   Physical Exam  Gen.:  well-nourished in appearance. Alert, appropriate and cooperative throughout exam. Head: Normocephalic without obvious abnormalities Eyes: No corneal or conjunctival inflammation noted. Pupils equal round reactive to light and accommodation. FOV normal. Extraocular motion intact. Vision grossly normal with lenses. Ears: External  ear exam reveals no significant lesions or deformities. Canals ; wax bilaterallyNose: External nasal exam reveals no deformity or inflammation.  Nasal mucosa are pink and moist. No lesions or exudates noted.   Mouth: Oral mucosa and oropharynx reveal no lesions or exudates. Teeth in good repair. Neck: No deformities, masses, or tenderness noted. Range of motion &  Thyroid normal. He has some discomfort with rotation of the head to the left Lungs: Normal respiratory effort; chest expands symmetrically. Lungs are clear to auscultation without rales, wheezes, or increased work of breathing. Heart: Normal rate and rhythm. Normal S1 and S2. No gallop, click, or rub. S 4; no murmur.                                                                            Musculoskeletal/extremities: No deformity or scoliosis noted of  the thoracic or lumbar spine; but the right thoracic musculature is larger than the left. No clubbing, cyanosis, edema, or deformity noted. Tone & strength  normal.Joints normal. Nail health  good. Vascular: Carotid & radial artery pulses are full and equal. No bruits present. Neurologic: Alert and oriented x3. Deep tendon reflexes symmetrical and normal. Gait, Romberg and finger nose testing is normal. No cranial nerve deficit present.         Skin: Intact without suspicious lesions or rashes. Lymph: No cervical, axillary lymphadenopathy present. Psych: Mood and affect are normal. Normally interactive  Assessment & Plan:  #1 cough; this appears to have some extrinsic component. A trial of Singulair would be appropriate. There is no evidence to suggest rhinosinusitis  #2 left frontal headache which is also recurrent; he states one to 2 times per year. If ophthalmologic evaluation is unremarkable; imaging (sinus CT) could be considered. A child fluticasone will be initiated.  #3 suggestion of C6 radiculopathy on the left. Cervical spine films will be completed. Tylenol will be prescribed. He's beenadvised not to take Aleve at bedtime because  risk of gastritis or ulcer

## 2011-05-04 NOTE — Patient Instructions (Addendum)
.  Share results with all MDs seen. Order for x-rays entered into  the computer; these will be performed at [address] [facility] across from [facility]. No appointment is necessary.  Flonase 1 spray in each nostril twice a day as needed. Use the "crossover" technique as discussed

## 2011-05-05 ENCOUNTER — Ambulatory Visit (INDEPENDENT_AMBULATORY_CARE_PROVIDER_SITE_OTHER)
Admission: RE | Admit: 2011-05-05 | Discharge: 2011-05-05 | Disposition: A | Discharge status: Home / Self Care | Payer: Medicare Other | Source: Ambulatory Visit | Attending: Internal Medicine | Admitting: Internal Medicine

## 2011-05-05 DIAGNOSIS — M5412 Radiculopathy, cervical region: Secondary | ICD-10-CM

## 2011-06-08 ENCOUNTER — Other Ambulatory Visit: Payer: Self-pay | Admitting: Internal Medicine

## 2011-06-19 ENCOUNTER — Other Ambulatory Visit: Payer: Self-pay | Admitting: Internal Medicine

## 2011-07-14 ENCOUNTER — Telehealth: Payer: Self-pay | Admitting: Internal Medicine

## 2011-07-14 MED ORDER — TRAMADOL HCL 50 MG PO TABS: 50.0000 mg | ORAL_TABLET | Freq: Four times a day (QID) | ORAL | Status: DC | PRN

## 2011-07-14 NOTE — Telephone Encounter (Signed)
OK #30, R X2 

## 2011-07-14 NOTE — Telephone Encounter (Signed)
Dr.Hopper please advise if ok to fill, med was last filled in January at last OV. If ok to fill, please advise if ok to give additional refills

## 2011-07-14 NOTE — Telephone Encounter (Signed)
Refill: tramadol 50mg  tab. Take 1 tablet by mouth every 6 hours as needed for pain. Qty 30. Last fill 05-04-11

## 2011-09-20 ENCOUNTER — Other Ambulatory Visit: Payer: Self-pay | Admitting: Internal Medicine

## 2011-12-19 ENCOUNTER — Ambulatory Visit: Payer: Medicare Other | Admitting: Internal Medicine

## 2011-12-20 ENCOUNTER — Encounter: Payer: Self-pay | Admitting: Internal Medicine

## 2011-12-20 ENCOUNTER — Ambulatory Visit (INDEPENDENT_AMBULATORY_CARE_PROVIDER_SITE_OTHER): Payer: Medicare Other | Admitting: Internal Medicine

## 2011-12-20 VITALS — BP 140/78 | HR 78 | Temp 98.0°F | Wt 275.8 lb

## 2011-12-20 DIAGNOSIS — R209 Unspecified disturbances of skin sensation: Secondary | ICD-10-CM

## 2011-12-20 DIAGNOSIS — R202 Paresthesia of skin: Secondary | ICD-10-CM

## 2011-12-20 DIAGNOSIS — M542 Cervicalgia: Secondary | ICD-10-CM

## 2011-12-20 MED ORDER — GABAPENTIN 100 MG PO CAPS: ORAL_CAPSULE | ORAL | Status: DC

## 2011-12-20 NOTE — Progress Notes (Signed)
  Subjective:    Patient ID: Wyatt Mason, male    DOB: 1933/02/16, 78 y.o.   MRN: [id number]  HPI Pain: Location: L face, L neck & shoulder  Onset: 2 months ago as aching in these areas upon awakening   Course: stable Self-treated with: no                   Review of Systems Pruritis: L face Tenderness: intermittently  New medications/antibiotics: no Rash: no but PMH of herpes zoster 2008 in same distribution  Feeling ill:no Fever/ weight loss:no Mouth lesions: no  Facial/tongue swelling/difficulty breathing:  no       Objective:   Physical Exam General appearance:good health ;well nourished; no acute distress or increased work of breathing is present.  No  lymphadenopathy about the head, neck, or axilla noted.   Eyes: No conjunctival inflammation or lid edema is present. EOMI. FOV WNL  Ears:  External ear exam shows no significant lesions or deformities.  Otoscopic examination reveals clear canals, tympanic membranes are intact bilaterally without bulging, retraction, inflammation or discharge. No TMJ. Tender to palpation L neck & L supraclavicular area  Nose:  External nasal examination shows no deformity or inflammation. Nasal mucosa are pink and moist without lesions or exudates. No septal dislocation or deviation.No obstruction to airflow.   Oral exam: Dental hygiene is good; lips and gums are healthy appearing.There is no oropharyngeal erythema or exudate noted.   Neck:  No deformities, thyromegaly, masses, or tenderness noted.   Decreased L lateral  range of motion  Extremities:  No cyanosis, edema, or clubbing  noted . Equal ROM of UE  Neuro: no cranial nerve deficit; DTRs equal & WNL   Skin: Warm & dry w/o rash          Assessment & Plan:  #1 L facial paresthesias in area of prior zoster; R/O post herpetic neuralgia #2 neck & shoulder pain ; R/O Cervical radiculopathy from osteophytes/ disc disease Plan: gabapentin trial;cervical spine  films if no  better

## 2011-12-20 NOTE — Patient Instructions (Addendum)
Assess response to the gabapentin one every 8 hours as needed. If it is partially beneficial, it can be increased up to a total of 3 pills every 8 hours as needed. This increase of 1 pill each dose  should take place over 72 hours at least.   If you activate My Chart; the results can be released to you as soon as they populate from the lab. If you choose not to use this program; the labs have to be reviewed, copied & mailed   causing a delay in getting the results to you.

## 2011-12-21 ENCOUNTER — Other Ambulatory Visit: Payer: Self-pay | Admitting: Internal Medicine

## 2012-05-14 ENCOUNTER — Other Ambulatory Visit: Payer: Self-pay | Admitting: Internal Medicine

## 2012-07-10 ENCOUNTER — Other Ambulatory Visit: Payer: Self-pay | Admitting: Internal Medicine

## 2012-09-03 ENCOUNTER — Telehealth: Payer: Self-pay | Admitting: General Practice

## 2012-09-03 MED ORDER — TRAMADOL HCL 50 MG PO TABS: 50.0000 mg | ORAL_TABLET | Freq: Four times a day (QID) | ORAL | Status: DC | PRN

## 2012-09-03 NOTE — Telephone Encounter (Signed)
Med filled per [facility]. Pt notified they need OV for more refills, stated will call back later to schedule.

## 2012-09-03 NOTE — Addendum Note (Signed)
Addended by: Jackson Latino on: 09/03/2012 10:37 AM   Modules accepted: Orders

## 2012-09-03 NOTE — Telephone Encounter (Signed)
Tramadol refill request. Pt last seen on 12/20/11, med last filled on 07-14-11 #30 with 1 refill. Please advise.

## 2012-09-03 NOTE — Telephone Encounter (Signed)
OK # 30; OV required for additional refills as last visit 8/13

## 2012-10-04 ENCOUNTER — Other Ambulatory Visit: Payer: Self-pay | Admitting: Internal Medicine

## 2012-10-04 NOTE — Telephone Encounter (Signed)
Left message on voicemail for patient to return call when available, reason for call (patient will need to sign contract prior to refill)

## 2012-10-09 NOTE — Telephone Encounter (Signed)
Patient aware Controlled Substance Contract to be sign and rx to be picked up   

## 2012-10-15 ENCOUNTER — Other Ambulatory Visit: Payer: Self-pay | Admitting: Internal Medicine

## 2012-10-15 NOTE — Telephone Encounter (Signed)
OK but OV needed prior to any additional refills

## 2012-10-15 NOTE — Telephone Encounter (Signed)
Ok to refill? Last OV 8.27.13 Last filled 6.12.14

## 2012-10-16 NOTE — Telephone Encounter (Signed)
Discuss with patient Rx sent. Pt decline to schedule OV now will call later

## 2012-10-23 ENCOUNTER — Encounter: Payer: Self-pay | Admitting: Internal Medicine

## 2012-10-29 ENCOUNTER — Other Ambulatory Visit: Payer: Self-pay | Admitting: Internal Medicine

## 2012-10-29 NOTE — Telephone Encounter (Signed)
Requested refill does not show a steady refill history. Left message on VM for patient to call and discuss medication

## 2012-10-30 NOTE — Telephone Encounter (Signed)
Left message on VM for patient to return call when available  

## 2012-11-01 NOTE — Telephone Encounter (Signed)
Spoke with patient, patient currently taking medication and has x 5 years or longer. Dr.Hopper rx'ed. Patient aware he is due for an appointment 11/2012, patient stated he will call back to schedule

## 2012-11-08 ENCOUNTER — Encounter: Payer: Self-pay | Admitting: Internal Medicine

## 2012-11-08 ENCOUNTER — Ambulatory Visit (INDEPENDENT_AMBULATORY_CARE_PROVIDER_SITE_OTHER): Payer: Medicare Other | Admitting: Internal Medicine

## 2012-11-08 VITALS — BP 134/78 | HR 72 | Temp 97.8°F | Wt 271.0 lb

## 2012-11-08 DIAGNOSIS — J209 Acute bronchitis, unspecified: Secondary | ICD-10-CM

## 2012-11-08 DIAGNOSIS — J019 Acute sinusitis, unspecified: Secondary | ICD-10-CM

## 2012-11-08 MED ORDER — FLUTICASONE PROPIONATE 50 MCG/ACT NA SUSP: 1.0000 | Freq: Two times a day (BID) | NASAL | Status: DC | PRN

## 2012-11-08 MED ORDER — CEFUROXIME AXETIL 500 MG PO TABS: 500.0000 mg | ORAL_TABLET | Freq: Two times a day (BID) | ORAL | Status: DC

## 2012-11-08 MED ORDER — HYDROCODONE-HOMATROPINE 5-1.5 MG/5ML PO SYRP: 5.0000 mL | ORAL_SOLUTION | Freq: Four times a day (QID) | ORAL | Status: DC | PRN

## 2012-11-08 NOTE — Progress Notes (Signed)
  Subjective:    Patient ID: Wyatt Mason, male    DOB: Nov 12, 1932, 79 y.o.   MRN: [id number]  HPI   Symptoms began one week ago as profuse rhinitis followed by cough productive of whitish lumps. He also had significant sneezing.  He developed frontal headache, facial pain, dental pain, and significant nasal purulence. Swelling is present; but this tends to be a chronic issue Alka-Seltzer was of partial benefit    Review of Systems  He denies associated itchy watery eyes, shortness of breath, or wheezing.  He has not had fever and chills. He also denies pelvic pain or otic discharge     Objective:   Physical Exam General appearance:good health ;well nourished; no acute distress or increased work of breathing is present.  No  lymphadenopathy about the head, neck, or axilla noted.   Eyes: No conjunctival inflammation or lid edema is present.   Ears:  External ear exam shows no significant lesions or deformities.  Otoscopic examination reveals clear canals, tympanic membranes are intact bilaterally without bulging, retraction, inflammation or discharge.  Nose:  External nasal examination shows no deformity or inflammation. Nasal mucosa are boggy without lesions or exudates. No septal dislocation or deviation.No obstruction to airflow.  Hyponasal speech  Oral exam: Dental hygiene is good; lips and gums are healthy appearing.There is no oropharyngeal erythema or exudate noted.   Neck:  No deformities, thyromegaly, masses, or tenderness noted.   Supple with full range of motion without pain.   Heart:  Normal rate and regular rhythm. S1 and S2 normal without gallop, murmur, click, rub or other extra sounds.  S4  Lungs:Chest clear to auscultation; no wheezes, rhonchi,rales ,or rubs present.No increased work of breathing.    Extremities:  No cyanosis, edema, or clubbing  noted    Skin: Warm & dry         Assessment & Plan:  #1 rhinosinusitis with significant bronchitis  Plan:  Nasal hygiene interventions discussed. See prescription medications

## 2012-11-08 NOTE — Patient Instructions (Addendum)

## 2013-01-23 ENCOUNTER — Other Ambulatory Visit: Payer: Self-pay | Admitting: Internal Medicine

## 2013-01-24 NOTE — Telephone Encounter (Signed)
Refill done.  

## 2013-04-14 ENCOUNTER — Inpatient Hospital Stay (HOSPITAL_COMMUNITY)
Admission: EM | Admit: 2013-04-14 | Discharge: 2013-04-16 | DRG: 247 | Disposition: A | Discharge status: Home / Self Care | Payer: Medicare Other | Attending: Internal Medicine | Admitting: Internal Medicine

## 2013-04-14 ENCOUNTER — Encounter (HOSPITAL_COMMUNITY): Payer: Self-pay | Admitting: Emergency Medicine

## 2013-04-14 DIAGNOSIS — I10 Essential (primary) hypertension: Secondary | ICD-10-CM | POA: Diagnosis present

## 2013-04-14 DIAGNOSIS — I2510 Atherosclerotic heart disease of native coronary artery without angina pectoris: Secondary | ICD-10-CM

## 2013-04-14 DIAGNOSIS — I219 Acute myocardial infarction, unspecified: Secondary | ICD-10-CM

## 2013-04-14 DIAGNOSIS — IMO0001 Reserved for inherently not codable concepts without codable children: Secondary | ICD-10-CM | POA: Diagnosis present

## 2013-04-14 DIAGNOSIS — Z6836 Body mass index (BMI) 36.0-36.9, adult: Secondary | ICD-10-CM

## 2013-04-14 DIAGNOSIS — Z96649 Presence of unspecified artificial hip joint: Secondary | ICD-10-CM

## 2013-04-14 DIAGNOSIS — E669 Obesity, unspecified: Secondary | ICD-10-CM | POA: Diagnosis present

## 2013-04-14 DIAGNOSIS — E785 Hyperlipidemia, unspecified: Secondary | ICD-10-CM | POA: Diagnosis present

## 2013-04-14 DIAGNOSIS — Z7982 Long term (current) use of aspirin: Secondary | ICD-10-CM

## 2013-04-14 DIAGNOSIS — E782 Mixed hyperlipidemia: Secondary | ICD-10-CM | POA: Diagnosis present

## 2013-04-14 DIAGNOSIS — I214 Non-ST elevation (NSTEMI) myocardial infarction: Principal | ICD-10-CM | POA: Diagnosis present

## 2013-04-14 DIAGNOSIS — E876 Hypokalemia: Secondary | ICD-10-CM | POA: Diagnosis not present

## 2013-04-14 DIAGNOSIS — M129 Arthropathy, unspecified: Secondary | ICD-10-CM | POA: Diagnosis present

## 2013-04-14 DIAGNOSIS — Z955 Presence of coronary angioplasty implant and graft: Secondary | ICD-10-CM

## 2013-04-14 HISTORY — DX: Essential (primary) hypertension: I10

## 2013-04-14 HISTORY — DX: Atherosclerotic heart disease of native coronary artery without angina pectoris: I25.10

## 2013-04-14 HISTORY — DX: Zoster without complications: B02.9

## 2013-04-14 HISTORY — DX: Hyperlipidemia, unspecified: E78.5

## 2013-04-14 HISTORY — DX: Obesity, unspecified: E66.9

## 2013-04-14 LAB — BASIC METABOLIC PANEL
BUN: 15 mg/dL (ref 6–23)
CO2: 32 mEq/L (ref 19–32)
Calcium: 8.6 mg/dL (ref 8.4–10.5)
Chloride: 98 mEq/L (ref 96–112)
GFR calc non Af Amer: 88 mL/min — ABNORMAL LOW (ref >90)
Glucose, Bld: 97 mg/dL (ref 70–99)
Potassium: 3.9 mEq/L (ref 3.5–5.1)
Sodium: 138 mEq/L (ref 135–145)

## 2013-04-14 LAB — CBC
HCT: 38 % — ABNORMAL LOW (ref 39.0–52.0)
Hemoglobin: 13.4 g/dL (ref 13.0–17.0)
MCHC: 35.3 g/dL (ref 30.0–36.0)
MCV: 90 fL (ref 78.0–100.0)
RBC: 4.22 MIL/uL (ref 4.22–5.81)

## 2013-04-14 LAB — PROTIME-INR
INR: 1.02 (ref 0.00–1.49)
Prothrombin Time: 13.2 seconds (ref 11.6–15.2)

## 2013-04-14 LAB — POCT I-STAT TROPONIN I

## 2013-04-14 MED ORDER — NITROGLYCERIN IN D5W 200-5 MCG/ML-% IV SOLN
5.0000 ug/min | Freq: Once | INTRAVENOUS | Status: AC
  Administered 2013-04-14: 5 ug/min via INTRAVENOUS
  Filled 2013-04-14: qty 250

## 2013-04-14 MED ORDER — HEPARIN (PORCINE) IN NACL 100-0.45 UNIT/ML-% IJ SOLN
1300.0000 [IU]/h | INTRAMUSCULAR | Status: DC
  Administered 2013-04-14: 1200 [IU]/h via INTRAVENOUS
  Administered 2013-04-15: 1300 [IU]/h via INTRAVENOUS
  Filled 2013-04-14 (×3): qty 250

## 2013-04-14 MED ORDER — METOPROLOL TARTRATE 25 MG PO TABS
25.0000 mg | ORAL_TABLET | Freq: Two times a day (BID) | ORAL | Status: DC
  Administered 2013-04-14 – 2013-04-16 (×4): 25 mg via ORAL
  Filled 2013-04-14 (×6): qty 1

## 2013-04-14 MED ORDER — ASPIRIN 300 MG RE SUPP
300.0000 mg | RECTAL | Status: AC
  Filled 2013-04-14: qty 1

## 2013-04-14 MED ORDER — ASPIRIN EC 81 MG PO TBEC
81.0000 mg | DELAYED_RELEASE_TABLET | Freq: Every day | ORAL | Status: DC
  Administered 2013-04-16: 81 mg via ORAL
  Filled 2013-04-14 (×2): qty 1

## 2013-04-14 MED ORDER — HEPARIN SODIUM (PORCINE) 5000 UNIT/ML IJ SOLN
4000.0000 [IU] | Freq: Once | INTRAMUSCULAR | Status: AC
  Administered 2013-04-14: 4000 [IU] via INTRAVENOUS
  Filled 2013-04-14: qty 1

## 2013-04-14 MED ORDER — ONDANSETRON HCL 4 MG/2ML IJ SOLN
4.0000 mg | Freq: Four times a day (QID) | INTRAMUSCULAR | Status: DC | PRN
  Administered 2013-04-15: 4 mg via INTRAVENOUS
  Filled 2013-04-14: qty 2

## 2013-04-14 MED ORDER — ATORVASTATIN CALCIUM 20 MG PO TABS
20.0000 mg | ORAL_TABLET | Freq: Every day | ORAL | Status: DC
  Administered 2013-04-14 – 2013-04-15 (×2): 20 mg via ORAL
  Filled 2013-04-14 (×3): qty 1

## 2013-04-14 MED ORDER — ASPIRIN 81 MG PO CHEW
81.0000 mg | CHEWABLE_TABLET | ORAL | Status: AC
  Administered 2013-04-15: 81 mg via ORAL
  Filled 2013-04-14: qty 1

## 2013-04-14 MED ORDER — NITROGLYCERIN IN D5W 200-5 MCG/ML-% IV SOLN
5.0000 ug/min | INTRAVENOUS | Status: DC
  Administered 2013-04-14: 10 ug/min via INTRAVENOUS

## 2013-04-14 MED ORDER — ACETAMINOPHEN 325 MG PO TABS
650.0000 mg | ORAL_TABLET | ORAL | Status: DC | PRN
  Administered 2013-04-15: 650 mg via ORAL
  Filled 2013-04-14: qty 2

## 2013-04-14 MED ORDER — ASPIRIN 81 MG PO CHEW: 324.0000 mg | CHEWABLE_TABLET | ORAL | Status: AC

## 2013-04-14 MED ORDER — METOPROLOL TARTRATE 1 MG/ML IV SOLN: 5.0000 mg | INTRAVENOUS | Status: DC | PRN

## 2013-04-14 MED ORDER — NITROGLYCERIN 0.4 MG SL SUBL: 0.4000 mg | SUBLINGUAL_TABLET | SUBLINGUAL | Status: DC | PRN

## 2013-04-14 MED ORDER — SODIUM CHLORIDE 0.9 % IV SOLN
1.0000 mL/kg/h | INTRAVENOUS | Status: DC
  Administered 2013-04-15: 1 mL/kg/h via INTRAVENOUS

## 2013-04-14 NOTE — H&P (Signed)
Wyatt Mason is an 79 y.o. male.   Chief Complaint: chest pain HPI: Mr. Wyatt Mason is a 79 year old man with a history of hypertension, arthritis, status post hip replacement, who was in his usual state of health until last night when he notes that he just felt poorly with very mild substernal chest discomfort. His symptoms resolved. We will this morning he also felt mild substernal chest discomfort. After church, he was with his family and had severe crushing substernal chest pain which radiated to the neck and back. It also radiated to the arm. It was not associated with diaphoresis or nausea or vomiting. He had mild shortness of breath. He called the ambulance and was given sublingual nitroglycerin with improvement in his symptoms. He is now chest pain-free. Initial blood test demonstrated a slightly elevated troponin. His electrocardiogram demonstrates sinus rhythm with 1 mm of flattened ST segment depression across the inferolateral leads. He is admitted for additional evaluation.  Past Medical History  Diagnosis Date  . Hypertension   . Fibromyalgia   . Shingles     Past Surgical History  Procedure Laterality Date  . Total hip arthroplasty Right 2005  . Appendectomy      No family history on file. Social History:  reports that he has never smoked. He does not have any smokeless tobacco history on file. He reports that he drinks about 1.2 ounces of alcohol per week. He reports that he does not use illicit drugs.  Allergies: No Known Allergies   (Not in a hospital admission)  Results for orders placed during the hospital encounter of 04/14/13 (from the past 48 hour(s))  CBC     Status: Abnormal   Collection Time    04/14/13  1:38 PM      Result Value Range   WBC 6.3  4.0 - 10.5 K/uL   RBC 4.22  4.22 - 5.81 MIL/uL   Hemoglobin 13.4  13.0 - 17.0 g/dL   HCT 91.4 (*) 78.2 - 95.6 %   MCV 90.0  78.0 - 100.0 fL   MCH 31.8  26.0 - 34.0 pg   MCHC 35.3  30.0 - 36.0 g/dL   RDW 21.3   08.6 - 57.8 %   Platelets 170  150 - 400 K/uL  BASIC METABOLIC PANEL     Status: Abnormal   Collection Time    04/14/13  1:38 PM      Result Value Range   Sodium 138  135 - 145 mEq/L   Potassium 3.9  3.5 - 5.1 mEq/L   Chloride 98  96 - 112 mEq/L   CO2 32  19 - 32 mEq/L   Glucose, Bld 97  70 - 99 mg/dL   BUN 15  6 - 23 mg/dL   Creatinine, Ser 4.69  0.50 - 1.35 mg/dL   Calcium 8.6  8.4 - 62.9 mg/dL   GFR calc non Af Amer 88 (*) >90 mL/min   GFR calc Af Amer >90  >90 mL/min   Comment: (NOTE)     The eGFR has been calculated using the CKD EPI equation.     This calculation has not been validated in all clinical situations.     eGFR's persistently <90 mL/min signify possible Chronic Kidney     Disease.  POCT I-STAT TROPONIN I     Status: Abnormal   Collection Time    04/14/13  1:59 PM      Result Value Range   Troponin i, poc 0.19 (*) 0.00 -  0.08 ng/mL   Comment NOTIFIED PHYSICIAN     Comment 3            Comment: Due to the release kinetics of cTnI,     a negative result within the first hours     of the onset of symptoms does not rule out     myocardial infarction with certainty.     If myocardial infarction is still suspected,     repeat the test at appropriate intervals.   No results found.  ROS  Blood pressure 174/94, pulse 76, temperature 98.8 F (37.1 C), temperature source Oral, resp. rate 21, height 6\' 1"  (1.854 m), weight 270 lb (122.471 kg), SpO2 96.00%. Physical Exam   Well appearing obese, 79 year old man, NAD HEENT: Unremarkable Neck:  No JVD, no thyromegally Back:  No CVA tenderness Lungs:  Clear with no wheezes, rales, or rhonchi. HEART:  Regular rate rhythm, no murmurs, no rubs, no clicks, soft S4 gallop is present Abd:  Soft, obese, positive bowel sounds, no organomegally, no rebound, no guarding Ext:  2 plus pulses, no edema, no cyanosis, no clubbing Skin:  No rashes no nodules Neuro:  CN II through XII intact, motor grossly intact  EKG: Normal  sinus rhythm with inferolateral ST segment depression Chest x-ray - pending  Assessment/Plan 1. unstable angina, now asymptomatic 2. Hypertension 3. Obesity Recommendation: The patient will be admitted to the hospital and started on intravenous heparin. His blood pressure is quite elevated, and he will be treated with intravenous metoprolol and continuation of his outpatient medications. Will rule out for MI with serial EKGs and cardiac enzymes. I suspect he will require heart catheterization as his clinical history is fairly typical for unstable angina and his initial cardiac markers are abnormal. He will need up titration of his blood pressure lowering medications. Statin therapy will also be initiated. Exercise and weight loss will be a long-term recommendation.  Kamyia Thomason,M.D. 04/14/2013, 3:51 PM

## 2013-04-14 NOTE — Progress Notes (Signed)
Dr Donnie Aho paged and informed of patient troponin 0.79. No new orders. Will continue to monitor.

## 2013-04-14 NOTE — ED Notes (Signed)
Per EMS pt from home c/o generalized chest pain 10/10 Pt took 324 ASA at home. EMS arrived and gave 1 nitro- pain then 0/10. Pt still denies CP. Pt EKG at home had minimal ST depression. On arrival to ED pt has had increased ST depression.

## 2013-04-14 NOTE — ED Notes (Signed)
Dr. James at bedside  

## 2013-04-14 NOTE — ED Notes (Signed)
Repeat EKG given to and signed by Dr. Fayrene Fearing

## 2013-04-14 NOTE — ED Provider Notes (Signed)
CSN: [id number]     Arrival date & time 04/14/13  1304 History   First MD Initiated Contact with Patient 04/14/13 1324     Chief Complaint  Patient presents with  . Chest Pain    HPI  Patient presents with chest pain. Pepcid last night for approximately an hour. Left chest left shoulder from the base of his neck to his left posterior ribs. He states it felt "like everything was caving in". He thinks his pain may resolve as he is able to sleep. He waking this morning with no pain. He got up approximately 10 AM. He just discharged with a church. He was pain-free. He returned from church. The same pain started again his left chest radiating to his left neck back and shoulder. He presents here. He called 911 and was transferred by paramedics. Given a single sublingual nitroglycerin. He presents pain-free.  Past Medical History  Diagnosis Date  . Hypertension   . Fibromyalgia   . Shingles    Past Surgical History  Procedure Laterality Date  . Total hip arthroplasty Right 2005  . Appendectomy     No family history on file. History  Substance Use Topics  . Smoking status: Never Smoker   . Smokeless tobacco: Not on file  . Alcohol Use: 1.2 oz/week    2 Shots of liquor per week    Review of Systems  Constitutional: Negative for fever, chills, diaphoresis, appetite change and fatigue.  HENT: Negative for mouth sores, sore throat and trouble swallowing.   Eyes: Negative for visual disturbance.  Respiratory: Positive for shortness of breath. Negative for chest tightness and wheezing.   Cardiovascular: Negative for chest pain.       Left chest pain radiating to neck back and shoulders  Gastrointestinal: Negative for nausea, vomiting, abdominal pain, diarrhea and abdominal distention.  Endocrine: Negative for polydipsia, polyphagia and polyuria.  Genitourinary: Negative for dysuria, frequency and hematuria.  Musculoskeletal: Negative for gait problem.  Skin: Negative for color change,  pallor and rash.  Neurological: Negative for dizziness, syncope, light-headedness and headaches.  Hematological: Does not bruise/bleed easily.  Psychiatric/Behavioral: Negative for behavioral problems and confusion.    Allergies  Review of patient's allergies indicates no known allergies.  Home Medications   Current Outpatient Rx  Name  Route  Sig  Dispense  Refill  . aspirin EC 81 MG tablet   Oral   Take 81 mg by mouth daily.         Marland Kitchen aspirin-sod bicarb-citric acid (ALKA-SELTZER) 325 MG TBEF tablet   Oral   Take 325 mg by mouth every 6 (six) hours as needed (for pain).         . Cholecalciferol (VITAMIN D-3) 1000 UNITS CAPS   Oral   Take by mouth daily.         . hydrochlorothiazide (MICROZIDE) 12.5 MG capsule      take 1 capsule by mouth once daily   90 capsule   0   . metoprolol tartrate (LOPRESSOR) 25 MG tablet   Oral   Take 25 mg by mouth 2 (two) times daily.          . Multiple Vitamin (MULTIVITAMIN WITH MINERALS) TABS tablet   Oral   Take 1 tablet by mouth daily.         . traMADol (ULTRAM) 50 MG tablet   Oral   Take 1 tablet (50 mg total) by mouth every 6 (six) hours as needed for pain.   30  tablet   0    BP 174/94  Pulse 76  Temp(Src) 98.8 F (37.1 C) (Oral)  Resp 21  Ht 6\' 1"  (1.854 m)  Wt 270 lb (122.471 kg)  BMI 35.63 kg/m2  SpO2 96% Physical Exam  Constitutional: He is oriented to person, place, and time. He appears well-developed and well-nourished. No distress.  Obese adult male. Not diaphoretic. Appears in no distress.  HENT:  Head: Normocephalic.  Eyes: Conjunctivae are normal. Pupils are equal, round, and reactive to light. No scleral icterus.  Neck: Normal range of motion. Neck supple. No thyromegaly present.  Cardiovascular: Normal rate and regular rhythm.  Exam reveals no gallop and no friction rub.   No murmur heard. Pulmonary/Chest: Effort normal and breath sounds normal. No respiratory distress. He has no wheezes. He  has no rales.  Abdominal: Soft. Bowel sounds are normal. He exhibits no distension. There is no tenderness. There is no rebound.  Musculoskeletal: Normal range of motion.  Neurological: He is alert and oriented to person, place, and time.  Skin: Skin is warm and dry. No rash noted.  Psychiatric: He has a normal mood and affect. His behavior is normal.    ED Course  CRITICAL CARE Performed by: Roney Marion Authorized by: Rolland Porter J Total critical care time: 45 minutes Critical care start time: 04/14/2013 3:09 PM Critical care end time: 04/14/2013 3:54 PM Critical care time was exclusive of separately billable procedures and treating other patients. Critical care was necessary to treat or prevent imminent or life-threatening deterioration of the following conditions: cardiac failure (Multiple IV drips.). Critical care was time spent personally by me on the following activities: development of treatment plan with patient or surrogate, discussions with consultants, evaluation of patient's response to treatment, examination of patient, obtaining history from patient or surrogate, ordering and performing treatments and interventions and re-evaluation of patient's condition. Subsequent provider of critical care: I assumed direction of critical care for this patient from another provider of my specialty. Comments: Patient placed on IV nitroglycerin infusion. Given heparin bolus and drip. Has come and stayed pain-free.   (including critical care time) Labs Review Labs Reviewed  CBC - Abnormal; Notable for the following:    HCT 38.0 (*)    All other components within normal limits  BASIC METABOLIC PANEL - Abnormal; Notable for the following:    GFR calc non Af Amer 88 (*)    All other components within normal limits  POCT I-STAT TROPONIN I - Abnormal; Notable for the following:    Troponin i, poc 0.19 (*)    All other components within normal limits   Imaging Review No results  found.  EKG Interpretation   None       MDM   1. Myocardial infarction    EKG shows ST changes with 1-2 mm of depression laterally. Noted on his lateral corneal lesions in his lateral limb leads as well. No change he became pain free on heparin and nitroglycerin. His troponin is elevated. Discussed case Dr. Delton See. She is en route. Diagnosis is non-Q-wave myocardial infarction. Hemodynamic stable.    Roney Marion, MD 04/14/13 1556

## 2013-04-14 NOTE — Plan of Care (Signed)
Problem: Consults Goal: Cardiac Cath Patient Education (See Patient Education module for education specifics.) Outcome: Completed/Met Date Met:  04/14/13 Video

## 2013-04-14 NOTE — ED Notes (Signed)
Pt sts last night when he went to bed he "didn't feel well" shoulder and back ached. Pt went to church felt "okay" had brunch and went home and was sitting down and pain begun to "build up" all over "neck, arms, shoulder, back, chest all the way down to my waist it all felt like it was caving in". Pt sts he chewed 4 baby aspirin. And then took the nitro via EMS and all the pain is gone now.

## 2013-04-14 NOTE — Progress Notes (Signed)
ANTICOAGULATION CONSULT NOTE - Follow Up Consult  Pharmacy Consult for heparin Indication: chest pain/ACS  No Known Allergies  Patient Measurements: Height: 6\' 1"  (185.4 cm) Weight: 270 lb (122.471 kg) IBW/kg (Calculated) : 79.9 Adjusted Dosing Weight: 97  Vital Signs: Temp: 98.8 F (37.1 C) (12/21 1313) Temp src: Oral (12/21 1313) BP: 142/66 mmHg (12/21 1345) Pulse Rate: 74 (12/21 1345)  Labs:  Recent Labs  04/14/13 1338  HGB 13.4  HCT 38.0*  PLT 170    Estimated Creatinine Clearance: 104.3 ml/min (by C-G formula based on Cr of 0.7).   Medications:  Aspirin 81 mg  Assessment: 79 yo man to start heparin per pharmacy for ACS/STEMI.  Wt 122.5 kg.  Hg 13.4, pltc 170 K.  Not on anticoagulants PTA.  EKG with ST depression.   Troponin 0.19.     Goal of Therapy:  Heparin level 0.3-0.7 units/ml Monitor platelets by anticoagulation protocol: Yes   Plan:  Give 4000 units bolus x 1 Start heparin infusion at 1200 units/hr Check anti-Xa level in 8 hours and daily while on heparin Continue to monitor H&H and platelets  Herby Abraham, Pharm.D. [phone] 04/14/2013 2:28 PM

## 2013-04-15 ENCOUNTER — Encounter (HOSPITAL_COMMUNITY)
Admission: EM | Disposition: A | Discharge status: Home / Self Care | Payer: Self-pay | Source: Home / Self Care | Attending: Internal Medicine

## 2013-04-15 ENCOUNTER — Other Ambulatory Visit: Payer: Self-pay

## 2013-04-15 DIAGNOSIS — I214 Non-ST elevation (NSTEMI) myocardial infarction: Secondary | ICD-10-CM

## 2013-04-15 DIAGNOSIS — I2510 Atherosclerotic heart disease of native coronary artery without angina pectoris: Secondary | ICD-10-CM

## 2013-04-15 HISTORY — DX: Atherosclerotic heart disease of native coronary artery without angina pectoris: I25.10

## 2013-04-15 HISTORY — PX: LEFT HEART CATHETERIZATION WITH CORONARY ANGIOGRAM: SHX5451

## 2013-04-15 HISTORY — PX: CORONARY ANGIOPLASTY WITH STENT PLACEMENT: SHX49

## 2013-04-15 LAB — CBC
HCT: 35.4 % — ABNORMAL LOW (ref 39.0–52.0)
MCH: 30.9 pg (ref 26.0–34.0)
MCHC: 34.5 g/dL (ref 30.0–36.0)
MCV: 89.6 fL (ref 78.0–100.0)
Platelets: 164 10*3/uL (ref 150–400)
RBC: 3.95 MIL/uL — ABNORMAL LOW (ref 4.22–5.81)

## 2013-04-15 LAB — TROPONIN I: Troponin I: 0.58 ng/mL (ref <0.30)

## 2013-04-15 LAB — POCT ACTIVATED CLOTTING TIME: Activated Clotting Time: 454 seconds

## 2013-04-15 LAB — HEPARIN LEVEL (UNFRACTIONATED): Heparin Unfractionated: 0.26 IU/mL — ABNORMAL LOW (ref 0.30–0.70)

## 2013-04-15 SURGERY — LEFT HEART CATHETERIZATION WITH CORONARY ANGIOGRAM
Anesthesia: LOCAL

## 2013-04-15 MED ORDER — TICAGRELOR 90 MG PO TABS
90.0000 mg | ORAL_TABLET | Freq: Two times a day (BID) | ORAL | Status: DC
  Administered 2013-04-15 – 2013-04-16 (×2): 90 mg via ORAL
  Filled 2013-04-15 (×3): qty 1

## 2013-04-15 MED ORDER — VERAPAMIL HCL 2.5 MG/ML IV SOLN
INTRAVENOUS | Status: AC
  Filled 2013-04-15: qty 2

## 2013-04-15 MED ORDER — OXYCODONE-ACETAMINOPHEN 5-325 MG PO TABS
1.0000 | ORAL_TABLET | ORAL | Status: DC | PRN
  Administered 2013-04-15: 17:00:00 1 via ORAL
  Filled 2013-04-15: qty 1

## 2013-04-15 MED ORDER — TICAGRELOR 90 MG PO TABS
ORAL_TABLET | ORAL | Status: AC
  Filled 2013-04-15: qty 2

## 2013-04-15 MED ORDER — MIDAZOLAM HCL 2 MG/2ML IJ SOLN
INTRAMUSCULAR | Status: AC
  Filled 2013-04-15: qty 2

## 2013-04-15 MED ORDER — AMLODIPINE BESYLATE 5 MG PO TABS
5.0000 mg | ORAL_TABLET | Freq: Every day | ORAL | Status: DC
  Administered 2013-04-15: 5 mg via ORAL
  Filled 2013-04-15 (×2): qty 1

## 2013-04-15 MED ORDER — FENTANYL CITRATE 0.05 MG/ML IJ SOLN
INTRAMUSCULAR | Status: AC
  Filled 2013-04-15: qty 2

## 2013-04-15 MED ORDER — BIVALIRUDIN 250 MG IV SOLR
INTRAVENOUS | Status: AC
  Filled 2013-04-15: qty 250

## 2013-04-15 MED ORDER — LIDOCAINE HCL (PF) 1 % IJ SOLN
INTRAMUSCULAR | Status: AC
  Filled 2013-04-15: qty 30

## 2013-04-15 MED ORDER — HYDRALAZINE HCL 20 MG/ML IJ SOLN
10.0000 mg | Freq: Once | INTRAMUSCULAR | Status: AC
  Administered 2013-04-15: 10 mg via INTRAVENOUS
  Filled 2013-04-15: qty 1

## 2013-04-15 MED ORDER — NITROGLYCERIN 0.2 MG/ML ON CALL CATH LAB
INTRAVENOUS | Status: AC
  Filled 2013-04-15: qty 1

## 2013-04-15 MED ORDER — SODIUM CHLORIDE 0.9 % IV SOLN: 1.0000 mL/kg/h | INTRAVENOUS | Status: AC

## 2013-04-15 MED ORDER — SODIUM CHLORIDE 0.9 % IV SOLN: 250.0000 mL | INTRAVENOUS | Status: DC | PRN

## 2013-04-15 MED ORDER — HEPARIN (PORCINE) IN NACL 2-0.9 UNIT/ML-% IJ SOLN
INTRAMUSCULAR | Status: AC
  Filled 2013-04-15: qty 1500

## 2013-04-15 MED ORDER — SODIUM CHLORIDE 0.9 % IJ SOLN: 3.0000 mL | Freq: Two times a day (BID) | INTRAMUSCULAR | Status: DC

## 2013-04-15 MED ORDER — LABETALOL HCL 5 MG/ML IV SOLN
INTRAVENOUS | Status: AC
  Filled 2013-04-15: qty 4

## 2013-04-15 MED ORDER — SODIUM CHLORIDE 0.9 % IJ SOLN: 3.0000 mL | INTRAMUSCULAR | Status: DC | PRN

## 2013-04-15 MED ORDER — LABETALOL HCL 5 MG/ML IV SOLN
20.0000 mg | Freq: Once | INTRAVENOUS | Status: AC
  Administered 2013-04-15: 20 mg via INTRAVENOUS

## 2013-04-15 MED ORDER — HEPARIN SODIUM (PORCINE) 1000 UNIT/ML IJ SOLN
INTRAMUSCULAR | Status: AC
  Filled 2013-04-15: qty 1

## 2013-04-15 MED ORDER — STUDY - INVESTIGATIONAL DRUG SIMPLE RECORD
7.5000 mg | Freq: Two times a day (BID) | Status: DC
  Administered 2013-04-15 – 2013-04-16 (×2): 7.5 mg via ORAL
  Filled 2013-04-15 (×2): qty 7.5

## 2013-04-15 NOTE — Progress Notes (Addendum)
Called because pt was found to be continually hypertensive after cath. SBP 220s post cath so was given 20mg  IV labetalol earlier today and 10mg  IV hydralazine 15 minutes prior to being called. SBP remained markedly elevated but this was by leg cuff. Confirmed with MD that we could use arm for BP cuff given dual radial sites this afternoon. SBP still high at 178 systolic. Will start amlodipine 5mg  daily today given trend for elevated BP's this admission. Follow BP. Dayna Dunn PA-C

## 2013-04-15 NOTE — Research (Signed)
LATITUDE Informed Consent   Subject Name: Wyatt Mason  Subject met inclusion and exclusion criteria.  The informed consent form, study requirements and expectations were reviewed with the subject and questions and concerns were addressed prior to the signing of the consent form.  The subject verbalized understanding of the trial requirements.  The subject agreed to participate in the LATITUDE trial and signed the informed consent.  The informed consent was obtained prior to performance of any protocol-specific procedures for the subject.  A copy of the signed informed consent was given to the subject and a copy was placed in the subject's medical record.  Brunilda Payor 04/15/2013, 12:38 PM

## 2013-04-15 NOTE — Progress Notes (Signed)
TR BAND REMOVAL  LOCATION:    left radial  DEFLATED PER PROTOCOL:    yes  TIME BAND OFF / DRESSING APPLIED:    1900   SITE UPON ARRIVAL:    Level 0  SITE AFTER BAND REMOVAL:    Level 0  REVERSE ALLEN'S TEST:     positive  CIRCULATION SENSATION AND MOVEMENT:    Within Normal Limits   yes  COMMENTS:   Tolerated procedure well

## 2013-04-15 NOTE — CV Procedure (Signed)
Cardiac Catheterization Procedure Note  Name: Wyatt Mason MRN: [id number] DOB: 04/24/1933  Procedure: Catheter placement for coronary angiography, Selective Coronary Angiography, PTCA and stenting of the first OM  Indication: NSTEMI  Procedural Details:  The right wrist was prepped, draped, and anesthetized with 1% lidocaine. Using the modified Seldinger technique, a 5/6 French sheath was introduced into the right radial artery. 3 mg of verapamil was administered through the sheath, weight-based unfractionated heparin was administered intravenously. The right subclavian artery was extremely tortuous. There was a 360 loop in the subclavian artery. I tried to use guide catheters with 0.035 inch wires for support to engage coronaries. This was unsuccessful. It was clear that the procedure would not be a success through the right radial access. At that point the left wrist was prepped, draped, and anesthetized with 1% lidocaine. A 5/6 French radial sheath was placed. Verapamil was administered through the sheath. Unfractionated heparin was administered. The JR 4 guide catheter was used for selective right coronary artery angiography. An XB 4 cm guide catheter was used for left coronary angiography. Catheter exchanges were performed over an exchange length guidewire.  PROCEDURAL FINDINGS Hemodynamics: AO 140/80 LV not recorded   Coronary angiography: Coronary dominance: right  Left mainstem: Widely patent with no obstructive disease. Mild calcification. The vessel divides into the LAD and left circumflex.  Left anterior descending (LAD): Normal caliber vessel reaching the left ventricular apex. Heavy calcification through the proximal segments. There is 50% proximal LAD stenosis. There is heavy associated calcification. The diagonal branch and mid LAD have diffuse irregularity with no evidence of high-grade stenosis. The LAD wraps around the left ventricular apex.  Left circumflex (LCx):  The left circumflex gives off a very large obtuse marginal branch. The midportion of the OM has a 90% stenosis. Further down in the OM there is a sequential 95% stenosis. The LM divides into multiple vessels distally.  Right coronary artery (RCA): Normal caliber, dominant vessel. There is heavy mid vessel calcification. There is eccentric 50-70% stenosis in the midportion. The distal vessel is patent. The PDA and PLA branches are small in caliber without significant disease.  Left ventriculography: Deferred  PCI Note:  Following the diagnostic procedure, the decision was made to proceed with PCI. The radial sheath was upsized to a 6 [place]. Weight-based bivalirudin was given for anticoagulation. He was loaded with brilinta 180 mg. Once a therapeutic ACT was achieved, a 6 [place] XB 4 cm guide catheter was inserted.  A cougar coronary guidewire was used to cross the lesion.  The lesion was predilated with a 3.0 x 12 mm balloon.  Guide and wire position were lost after balloon predilatation. A new guide catheter was inserted. The lesion was recrossed with the same cougar wire. The distal lesion was then stented with a 4.0 x 12 mm Promus Premier drug-eluting stent.  The more proximal lesion was also stented with a 4.0 x 12 mm Promus Premier DES. Both lesions were postdilated with 4.5 cm noncompliant balloon to 16 atmospheres.  Following PCI, there was 0% residual stenosis and TIMI-3 flow at both lesion sites. Final angiography confirmed an excellent result. The patient tolerated the procedure well. There were no immediate procedural complications. A TR band was used for radial hemostasis. The patient was transferred to the post catheterization recovery area for further monitoring.  PCI Data: Lesion 1 Vessel - OM/Segment - mid Percent Stenosis (pre)  95 TIMI-flow 3 Stent 4.0x12 mm Promus DES Percent Stenosis (post) 0 TIMI-flow (  post) 3  Lesion 2 Vessel - OM/Segment - prox Percent Stenosis (pre)   90 TIMI-flow 3 Stent 4.0x12 mm Promus DES Percent Stenosis (post) 0 TIMI-flow (post) 3  Final Conclusions:   1. Severe single-vessel coronary artery disease with successful PCI of the left circumflex 2. Moderate LAD and RCA stenoses   Recommendations:  Dual antiplatelet therapy with aspirin and brilinta for at least 12 months. Aggressive medical therapy for the patient's residual coronary artery disease. We'll check a 2-D echocardiogram to assess left ventricular function.  Tonny Bollman 04/15/2013, 2:40 PM

## 2013-04-15 NOTE — Progress Notes (Signed)
UR Completed.  Josette Shimabukuro Jane [phone] 04/15/2013  

## 2013-04-15 NOTE — Research (Signed)
LATITUDE Research study drug given at 12:30

## 2013-04-15 NOTE — Progress Notes (Signed)
Hep gtt turned of at this time in prep for cardiac cath.

## 2013-04-15 NOTE — Progress Notes (Signed)
ANTICOAGULATION CONSULT NOTE - Follow Up Consult  Pharmacy Consult for heparin Indication: USAP  Labs:  Recent Labs  04/14/13 1338 04/14/13 1905 04/15/13 0020 04/15/13 0430  HGB 13.4  --  12.2*  --   HCT 38.0*  --  35.4*  --   PLT 170  --  164  --   LABPROT  --  13.2  --   --   INR  --  1.02  --   --   HEPARINUNFRC  --   --   --  0.26*  CREATININE 0.70  --   --   --   TROPONINI  --  0.74* 0.62* 0.58*    Assessment: 80yo male subtherapeutic on heparin with initial dosing for USAP, going to cath this pm; of note RN says IV has been beeping frequently overnight, not clear whether this has affected level.  Goal of Therapy:  Heparin level 0.3-0.7 units/ml   Plan:  Will increase heparin gtt by 1 unit/kg/hr to 1300 units/hr and check level in 6hr.  Vernard Gambles, PharmD, BCPS  04/15/2013,5:53 AM

## 2013-04-15 NOTE — Interval H&P Note (Signed)
History and Physical Interval Note:  04/15/2013 12:54 PM  Wyatt Mason  has presented today for surgery, with the diagnosis of cp  The various methods of treatment have been discussed with the patient and family. After consideration of risks, benefits and other options for treatment, the patient has consented to  Procedure(s): LEFT HEART CATHETERIZATION WITH CORONARY ANGIOGRAM (N/A) as a surgical intervention .  The patient's history has been reviewed, patient examined, no change in status, stable for surgery.  I have reviewed the patient's chart and labs.  Questions were answered to the patient's satisfaction.    Cath Lab Visit (complete for each Cath Lab visit)  Clinical Evaluation Leading to the Procedure:   ACS: yes  Non-ACS:    Anginal Classification: CCS IV  Anti-ischemic medical therapy: Minimal Therapy (1 class of medications)  Non-Invasive Test Results: No non-invasive testing performed  Prior CABG: No previous CABG         Tonny Bollman

## 2013-04-15 NOTE — Progress Notes (Signed)
TR BAND REMOVAL  LOCATION:    right radial  DEFLATED PER PROTOCOL:    yes  TIME BAND OFF / DRESSING APPLIED:    1900   SITE UPON ARRIVAL:    Level 0  SITE AFTER BAND REMOVAL:    Level 0  REVERSE ALLEN'S TEST:     positive  CIRCULATION SENSATION AND MOVEMENT:    Within Normal Limits   yes  COMMENTS:   Tolerated procedure well  

## 2013-04-16 ENCOUNTER — Telehealth: Payer: Self-pay | Admitting: Cardiovascular Disease

## 2013-04-16 ENCOUNTER — Encounter (HOSPITAL_COMMUNITY): Payer: Self-pay | Admitting: Physician Assistant

## 2013-04-16 DIAGNOSIS — I517 Cardiomegaly: Secondary | ICD-10-CM

## 2013-04-16 DIAGNOSIS — E876 Hypokalemia: Secondary | ICD-10-CM

## 2013-04-16 DIAGNOSIS — I214 Non-ST elevation (NSTEMI) myocardial infarction: Secondary | ICD-10-CM

## 2013-04-16 DIAGNOSIS — I2510 Atherosclerotic heart disease of native coronary artery without angina pectoris: Secondary | ICD-10-CM

## 2013-04-16 DIAGNOSIS — E669 Obesity, unspecified: Secondary | ICD-10-CM

## 2013-04-16 LAB — CBC
MCHC: 34.6 g/dL (ref 30.0–36.0)
Platelets: 160 10*3/uL (ref 150–400)
RBC: 4.04 MIL/uL — ABNORMAL LOW (ref 4.22–5.81)
RDW: 13.2 % (ref 11.5–15.5)
WBC: 7.5 10*3/uL (ref 4.0–10.5)

## 2013-04-16 LAB — BASIC METABOLIC PANEL
Chloride: 102 mEq/L (ref 96–112)
Creatinine, Ser: 0.7 mg/dL (ref 0.50–1.35)
GFR calc Af Amer: >90 mL/min (ref >90)
GFR calc non Af Amer: 88 mL/min — ABNORMAL LOW (ref >90)
Potassium: 3.4 mEq/L — ABNORMAL LOW (ref 3.5–5.1)

## 2013-04-16 LAB — TROPONIN I
Troponin I: 3.75 ng/mL (ref <0.30)
Troponin I: 5.92 ng/mL (ref <0.30)

## 2013-04-16 LAB — LIPID PANEL
Cholesterol: 212 mg/dL — ABNORMAL HIGH (ref 0–200)
LDL Cholesterol: 129 mg/dL — ABNORMAL HIGH (ref 0–99)
Total CHOL/HDL Ratio: 3.5 RATIO
VLDL: 22 mg/dL (ref 0–40)

## 2013-04-16 MED ORDER — POTASSIUM CHLORIDE CRYS ER 20 MEQ PO TBCR: 20.0000 meq | EXTENDED_RELEASE_TABLET | Freq: Every day | ORAL | Status: DC

## 2013-04-16 MED ORDER — TICAGRELOR 90 MG PO TABS: 90.0000 mg | ORAL_TABLET | Freq: Two times a day (BID) | ORAL | Status: DC

## 2013-04-16 MED ORDER — ATORVASTATIN CALCIUM 40 MG PO TABS: 40.0000 mg | ORAL_TABLET | Freq: Every day | ORAL | Status: DC

## 2013-04-16 MED ORDER — HYDROCHLOROTHIAZIDE 12.5 MG PO CAPS
12.5000 mg | ORAL_CAPSULE | Freq: Every day | ORAL | Status: DC
  Administered 2013-04-16: 10:00:00 12.5 mg via ORAL
  Filled 2013-04-16: qty 1

## 2013-04-16 MED ORDER — NITROGLYCERIN 0.4 MG SL SUBL: 0.4000 mg | SUBLINGUAL_TABLET | SUBLINGUAL | Status: DC | PRN

## 2013-04-16 MED ORDER — POTASSIUM CHLORIDE CRYS ER 20 MEQ PO TBCR
40.0000 meq | EXTENDED_RELEASE_TABLET | Freq: Once | ORAL | Status: AC
  Administered 2013-04-16: 40 meq via ORAL
  Filled 2013-04-16: qty 2

## 2013-04-16 NOTE — Care Management Note (Signed)
    Page 1 of 1   04/16/2013     11:26:03 AM   CARE MANAGEMENT NOTE 04/16/2013  Patient:  Wyatt Mason, Wyatt Mason   Account Number:  [id number]  Date Initiated:  04/15/2013  Documentation initiated by:  Avie Arenas  Subjective/Objective Assessment:   Admitted with CP - NSTEMI//Home with spouse     Action/Plan:   LEFT HEART CATHETERIZATION WITH CORONARY ANGIOGRAM  as a surgical intervention.//return home with spouse; benefits check for Brilinta   Anticipated DC Date:  04/16/2013   Anticipated DC Plan:  HOME/SELF CARE      DC Planning Services  CM consult      Choice offered to / List presented to:             Status of service:  In process, will continue to follow Medicare Important Message given?   (If response is "NO", the following Medicare IM given date fields will be blank) Date Medicare IM given:   Date Additional Medicare IM given:    Discharge Disposition:    Per UR Regulation:  Reviewed for med. necessity/level of care/duration of stay  If discussed at Long Length of Stay Meetings, dates discussed:    Comments:  ContactDevelle, [place] [phone]  04/16/13 1100 Jachob Mcclean Lucretia Roers, RN, BSN, [place] [phone] Spoke with pt at bedside regarding benefits check for Brilinta.  Pt has brochure with 30 day free card and refill assistance card intact.  Pt utilizes [employer] on [address] for prescription needs.  NCM called pharmacy to confirm availability of medication. [facility] is out of stock; but pt given option on filling at [facility] or [facility].  Information reviewed with pt.  Pt verbalizes importance of filling medication upon discharge.

## 2013-04-16 NOTE — Telephone Encounter (Signed)
New problem    TCM  appt in 7 days with Wyatt Mason on  12/30 @ 2pm

## 2013-04-16 NOTE — Progress Notes (Signed)
  Echocardiogram 2D Echocardiogram has been performed.  Arvil Chaco 04/16/2013, 11:16 AM

## 2013-04-16 NOTE — Progress Notes (Signed)
CARDIAC REHAB PHASE I   PRE:  Rate/Rhythm: 84 SR  BP:  Supine:   Sitting: 134/63  Standing:    SaO2:   MODE:  Ambulation: 1000 ft   POST:  Rate/Rhythm: 94 SR  BP:  Supine:   Sitting: 145/55  Standing:    SaO2:  1610-9604 Pt tolerated ambulation well without c/o of cp or SOB. VS stable . Completed MI and stent education with pt , son and wife. Pt voices understanding. He agrees to Outpt CRP in [place], will send referral.  Melina Copa RN 04/16/2013 8:42 AM

## 2013-04-16 NOTE — Progress Notes (Signed)
    Subjective:  No chest pain or dyspnea. No complaints this am.  Objective:  Vital Signs in the last 24 hours: Temp:  [98.2 F (36.8 C)-99 F (37.2 C)] 98.6 F (37 C) (12/23 0647) Pulse Rate:  [62-92] 78 (12/23 0647) Resp:  [14-20] 18 (12/23 0647) BP: (111-239)/(45-96) 127/51 mmHg (12/23 0647) SpO2:  [92 %-100 %] 93 % (12/23 0647) Weight:  [273 lb 13 oz (124.2 kg)] 273 lb 13 oz (124.2 kg) (12/23 0004)  Intake/Output from previous day: 12/22 0701 - 12/23 0700 In: 1142.5 [P.O.:120; I.V.:1022.5] Out: 800 [Urine:800]  Physical Exam: Pt is alert and oriented, NAD HEENT: normal Neck: JVP - normal Lungs: CTA bilaterally CV: RRR without murmur or gallop Abd: soft, NT, Positive BS, no hepatomegaly Ext: trace pretibial edema bilaterally, distal pulses intact and equal, bilateral radial sites clear Skin: warm/dry no rash   Lab Results:  Recent Labs  04/15/13 0020 04/16/13 0640  WBC 6.5 7.5  HGB 12.2* 12.7*  PLT 164 160    Recent Labs  04/14/13 1338 04/16/13 0640  NA 138 140  K 3.9 3.4*  CL 98 102  CO2 32 26  GLUCOSE 97 108*  BUN 15 10  CREATININE 0.70 0.70    Recent Labs  04/15/13 2335 04/16/13 0640  TROPONINI 3.75* 5.92*    Tele: Sinus rhythm, personally reviewed  Assessment/Plan:  1. NSTEMI - s/p PCI of the left circumflex (drug-eluting), moderate residual CAD with plans for medical therapy. ASA, Brilinta at least 12 months 2. HTN - resume HCTZ and metoprolol - he reports good control previously 3. Lipids - on statin with diffuse CAD. Add cholesterol panel to blood in lab  Dispo: home this am. Follow-up with APP in office 2 weeks - I will follow long-term. thx  Tonny Bollman, M.D. 04/16/2013, 7:53 AM

## 2013-04-16 NOTE — Discharge Summary (Signed)
Discharge Summary   Patient ID: Wyatt Mason,  MRN: [id number], DOB/AGE: 08/02/1932 79 y.o.  Admit date: 04/14/2013 Discharge date: 04/16/2013  Primary Physician: Marga Melnick, MD Primary Cardiologist: New- M. Excell Seltzer, MD  Discharge Diagnoses Principal Problem:   NSTEMI (non-ST elevated myocardial infarction)  - cardiac cath/PCI 04/15/13: DES x 2-prox 90% & mid 95% OM lesions; 50% prox LAD, 50-70% mid RCA treated medically; LVgram deferred  - DAPT- ASA/Brilinta x 12 months recommended Active Problems:   CAD (coronary artery disease), native coronary artery  - Discharged on ASA/Brilinta/BB/statin/NTG SL PRN  - Consider adding ACEi if EF reduced < 45% on follow-up echo and/or if BP remains elevated on follow-up   HYPERLIPIDEMIA  - Atorvastatin 20 increased to 40mg  daily for high-potency   - LDL 129, HDL 61, TG 112, TC 212   HYPERTENSION  - Controlled on restarting metoprolol tartrate and HCTZ   Obesity  - Diet & exercise->weight loss->RF reduction   Hypokalemia  - K 3.4 this AM, given KDur 40 mEq x 1 prior to discharge  Allergies No Known Allergies  Diagnostic Studies/Procedures  CARDIAC CATHETERIZATION + PERCUTANEOUS CORONARY INTERVENTION- 04/15/13  Hemodynamics:  AO 140/80  LV not recorded   Coronary angiography:  Coronary dominance: right  Left mainstem: Widely patent with no obstructive disease. Mild calcification. The vessel divides into the LAD and left circumflex.  Left anterior descending (LAD): Normal caliber vessel reaching the left ventricular apex. Heavy calcification through the proximal segments. There is 50% proximal LAD stenosis. There is heavy associated calcification. The diagonal branch and mid LAD have diffuse irregularity with no evidence of high-grade stenosis. The LAD wraps around the left ventricular apex.  Left circumflex (LCx): The left circumflex gives off a very large obtuse marginal branch. The midportion of the OM has a 90% stenosis.  Further down in the OM there is a sequential 95% stenosis. The LM divides into multiple vessels distally.  Right coronary artery (RCA): Normal caliber, dominant vessel. There is heavy mid vessel calcification. There is eccentric 50-70% stenosis in the midportion. The distal vessel is patent. The PDA and PLA branches are small in caliber without significant disease.  Left ventriculography: Deferred   PCI Data:   Lesion 1  Vessel - OM/Segment - mid  Percent Stenosis (pre) 95  TIMI-flow 3  Stent 4.0x12 mm Promus DES  Percent Stenosis (post) 0  TIMI-flow (post) 3   Lesion 2  Vessel - OM/Segment - prox  Percent Stenosis (pre) 90  TIMI-flow 3  Stent 4.0x12 mm Promus DES  Percent Stenosis (post) 0  TIMI-flow (post) 3   Final Conclusions:  1. Severe single-vessel coronary artery disease with successful PCI of the left circumflex  2. Moderate LAD and RCA stenoses   2D ECHOCARDIOGRAM - 04/16/13 Pending formal interpretation.  History of Present Illness Wyatt Mason is a 79 y.o. male who was admitted to [facility] hospital from 12/21 to 04/16/2013 with the above problem list.   He had no prior cardiac history. Past medical history was significant for hypertension, arthritis, s/p hip replacement and obesity. He had experienced mid substernal aching chest pain the day prior to admission. His symptoms resolved, but he had recurrent discomfort the following day while at church radiating to neck, back and arm. No associated diaphoresis or n/v. He did have mild dyspnea. He called the ambulance and administered NTG SL with some relief.   Upon arrival to the ED, EKG revealed 1 mm flat ST depressions in the inferolateral  leads. An initial troponin did return mildly elevated at 0.19. BMET and CBC were otherwise unremarkable. Given the patient's cardiac risk factors, HPI concerning for ACS and objective evidence of myocardial ischemia/NSTEMI, the patient was admitted with plans for cardiac  catheterization on Monday.   Hospital Course   He was heparinized and remained stable and asymptomatic leading up to cath. TnI trend 0.19-0.74-0.62-0.58-3.75-.5.92He was informed, consented and prepped for the procedure which was accessed via the R radial artery initially, but access was very difficult due to a tortuous R subclavian artery. Hence, access was obtained via the L radial artery next with success. As above, there were sequential 90% then 95% mid OM lesions felt to be the culprit. There were residual 50% prox LAD and 50-70% mid RCA stenoses. Two DESs were successfully placed to the OM lesions. The patient tolerated the procedure well without complications. The recommendation was made to continue DAPT- ASA/Brilinta x 12 months- and to aggressively pursue medical management for the residual CAD.   Post-cath, he was hypertensive (SBP 180-220s) requiring IV labetalol and hydralazine with minimal improvement. He was given a dose of amlodipine 5mg  with adequate control. He was later switched to metoprolol and HCTZ as this had provided good BP control in the past. He remained stable overnight without complaints.   He was observed overnight. He was evaluated by Dr. Excell Seltzer this morning. His BP was much better controlled (111-134/45-63). Labwork returned and he was found to be very mildly hypokalemic (3.4). He was given a dose of KDur 40 mEq x 1. Troponins checked post-PCI did reveal a significant up-trend at 3.75, then 5.92. He ambulated in the hallways with no chest pain. EKG this morning revealed no ischemic changes. He was monitored until this afternoon so that a third troponin could be checked and a 2D echocardiogram could be performed.  The next troponin returned lower at 4.36.  2D echo showed was performed and the formal interpretation is pending at the time of this discharge summary.  He will be discharged on the medication regimen outlined below. He will follow-up in 1 week as part of  transitional care management. A BMET drawn at that time. This information, including post-cath instructions and activity restrictions, has been clearly outlined in the discharge AVS.   Wyatt Mason is out of stock at his pharmacy. It has been called, and they will have more available after 3:00 PM tomorrow. Will provide two tablets of Brilinta for tonight and tomorrow morning. He will fill the 30 day prescription tomorrow afternoon.   Discharge Vitals:  Blood pressure 110/47, pulse 64, temperature 98.6 F (37 C), temperature source Oral, resp. rate 18, height 6\' 1"  (1.854 m), weight 273 lb 13 oz (124.2 kg), SpO2 93.00%.  Weight change: 3 lb 13 oz (1.729 kg)  Labs: Recent Labs     04/15/13  0020  04/16/13  0640  WBC  6.5  7.5  HGB  12.2*  12.7*  HCT  35.4*  36.7*  MCV  89.6  90.8  PLT  164  160    Recent Labs Lab 04/14/13 1338 04/16/13 0640  NA 138 140  K 3.9 3.4*  CL 98 102  CO2 32 26  BUN 15 10  CREATININE 0.70 0.70  CALCIUM 8.6 8.2*  GLUCOSE 97 108*   Recent Labs     04/15/13  2335  04/16/13  0640  04/16/13  1354  TROPONINI  3.75*  5.92*  4.36*   Recent Labs     04/16/13  0640  CHOL  212*  HDL  61  LDLCALC  129*  TRIG  112  CHOLHDL  3.5   Disposition:  Discharge Orders   Future Appointments Provider Department Dept Phone   04/23/2013 1:45 PM [facility] [phone]   04/23/2013 2:00 PM Rosalio Macadamia, NP [facility] [phone]   Future Orders Complete By Expires                      Basic Metabolic Panel (BMET)  04/23/2013 04/16/2014   Questions:     Has the patient fasted?:     Amb Referral to Cardiac Rehabilitation  As directed    Diet - low sodium heart healthy  As directed    Increase activity slowly  As directed          Follow-up Information   Follow up with Norma Fredrickson, NP On 04/23/2013. (At 2:00 PM for post-hospital cardiology follow-up. )    Specialty:  Nurse Practitioner    Contact information:   [address] [place] [address] [phone]       Follow up with Marga Melnick, MD. Schedule an appointment as soon as possible for a visit in 1 week. (For general post-hospital follow-up.)    Specialty:  Internal Medicine   Contact information:   [phone] [address] [facility] [address] [place] [address] [phone]       Discharge Medications:    Medication List    STOP taking these medications       aspirin-sod bicarb-citric acid 325 MG Tbef tablet  Commonly known as:  ALKA-SELTZER      TAKE these medications       aspirin EC 81 MG tablet  Take 81 mg by mouth daily.     atorvastatin 40 MG tablet  Commonly known as:  LIPITOR  Take 1 tablet (40 mg total) by mouth daily at 6 PM.     hydrochlorothiazide 12.5 MG capsule  Commonly known as:  MICROZIDE  take 1 capsule by mouth once daily     metoprolol tartrate 25 MG tablet  Commonly known as:  LOPRESSOR  Take 25 mg by mouth 2 (two) times daily.     multivitamin with minerals Tabs tablet  Take 1 tablet by mouth daily.     nitroGLYCERIN 0.4 MG SL tablet  Commonly known as:  NITROSTAT  Place 1 tablet (0.4 mg total) under the tongue every 5 (five) minutes x 3 doses as needed for chest pain.     potassium chloride SA 20 MEQ tablet  Commonly known as:  K-DUR,KLOR-CON  Take 1 tablet (20 mEq total) by mouth daily.     Ticagrelor 90 MG Tabs tablet  Commonly known as:  BRILINTA  Take 1 tablet (90 mg total) by mouth 2 (two) times daily.     traMADol 50 MG tablet  Commonly known as:  ULTRAM  Take 1 tablet (50 mg total) by mouth every 6 (six) hours as needed for pain.     Vitamin D-3 1000 UNITS Caps  Take by mouth daily.       Outstanding Labs/Studies: BMET and 2D echo on 12/30 follow-up  Duration of Discharge Encounter: Greater than 30 minutes including physician time.  Signed, R. Hurman Horn, PA-C 04/16/2013, 3:06 PM

## 2013-04-22 NOTE — Telephone Encounter (Signed)
Pt doing well since discharge from the hospital. I made pt aware of Echo results from hospitalization.  The pt is aware of post hospital appointments that are scheduled tomorrow and he had no new questions since his discharge.  The pt has been taking medications as instructed.

## 2013-04-23 ENCOUNTER — Other Ambulatory Visit: Payer: Medicare Other

## 2013-04-23 ENCOUNTER — Ambulatory Visit (INDEPENDENT_AMBULATORY_CARE_PROVIDER_SITE_OTHER): Payer: Medicare Other | Admitting: Nurse Practitioner

## 2013-04-23 ENCOUNTER — Encounter: Payer: Self-pay | Admitting: Nurse Practitioner

## 2013-04-23 VITALS — BP 150/90 | HR 63 | Ht 73.0 in | Wt 267.4 lb

## 2013-04-23 DIAGNOSIS — I214 Non-ST elevation (NSTEMI) myocardial infarction: Secondary | ICD-10-CM

## 2013-04-23 LAB — CBC WITH DIFFERENTIAL/PLATELET
Basophils Absolute: 0 10*3/uL (ref 0.0–0.1)
Basophils Relative: 0.3 % (ref 0.0–3.0)
Eosinophils Absolute: 0.2 10*3/uL (ref 0.0–0.7)
Eosinophils Relative: 3.3 % (ref 0.0–5.0)
HCT: 39.8 % (ref 39.0–52.0)
Hemoglobin: 13.6 g/dL (ref 13.0–17.0)
Lymphocytes Relative: 30.2 % (ref 12.0–46.0)
Lymphs Abs: 1.8 10*3/uL (ref 0.7–4.0)
MCHC: 34.1 g/dL (ref 30.0–36.0)
MCV: 90.9 fl (ref 78.0–100.0)
Monocytes Absolute: 0.4 10*3/uL (ref 0.1–1.0)
Monocytes Relative: 7.1 % (ref 3.0–12.0)
Neutro Abs: 3.5 10*3/uL (ref 1.4–7.7)
Neutrophils Relative %: 59.1 % (ref 43.0–77.0)
Platelets: 219 10*3/uL (ref 150.0–400.0)
RBC: 4.38 Mil/uL (ref 4.22–5.81)
RDW: 13.2 % (ref 11.5–14.6)
WBC: 6 10*3/uL (ref 4.5–10.5)

## 2013-04-23 LAB — BASIC METABOLIC PANEL
BUN: 16 mg/dL (ref 6–23)
CO2: 32 mEq/L (ref 19–32)
Calcium: 9.1 mg/dL (ref 8.4–10.5)
Chloride: 100 mEq/L (ref 96–112)
Creatinine, Ser: 0.9 mg/dL (ref 0.4–1.5)
GFR: 85.51 mL/min (ref >60.00)
Glucose, Bld: 104 mg/dL — ABNORMAL HIGH (ref 70–99)
Potassium: 4.3 mEq/L (ref 3.5–5.1)
Sodium: 137 mEq/L (ref 135–145)

## 2013-04-23 NOTE — Patient Instructions (Signed)
I think you are doing well  Stay on your current medicines  I have sent a note to cardiac rehab for you to start after the New Year  I will see you in a month with fasting labs (on a day that Dr. Excell Seltzer is here)  We will check labs today  Monitor your blood pressure at home for me - keep a diary and bring back for me to review.  Call the [facility] office at [phone] if you have any questions, problems or concerns.

## 2013-04-23 NOTE — Progress Notes (Signed)
[name] Date of Birth: 08-11-1932 Medical Record #[id number]  History of Present Illness: Mr. Denbleyker is seen back today for a post hospital visit/TOC visit. Seen for Dr. Excell Seltzer. He has HTN, OA and obesity.  Most recently presented with NSTEMI and had DES x 2 proximal 90% and mild 95% OM lesions. Did have residual disease - to manage medically. On DAPT with Brilinta for one year. ACE to be considered if EF remains reduced on follow up echo or if BP elevated. Statin was increased.   Comes back today. Here with his wife. He is doing well. No chest pain. Not short of breath but did note a little dyspnea after taking his Brilinta but this is now resolved. Not dizzy or lightheaded. Weather has limited his ability to exercise. Trying to do better with his diet but seems a little frustrated. Tolerating his medicines. Not checking his BP at home. No real issues since coming home.   Current Outpatient Prescriptions  Medication Sig Dispense Refill  . aspirin EC 81 MG tablet Take 81 mg by mouth daily.      Marland Kitchen atorvastatin (LIPITOR) 40 MG tablet Take 1 tablet (40 mg total) by mouth daily at 6 PM.  30 tablet  3  . Cholecalciferol (VITAMIN D-3) 1000 UNITS CAPS Take by mouth daily.      . hydrochlorothiazide (MICROZIDE) 12.5 MG capsule take 1 capsule by mouth once daily  90 capsule  0  . metoprolol tartrate (LOPRESSOR) 25 MG tablet Take 25 mg by mouth 2 (two) times daily.       . Multiple Vitamin (MULTIVITAMIN WITH MINERALS) TABS tablet Take 1 tablet by mouth daily.      . nitroGLYCERIN (NITROSTAT) 0.4 MG SL tablet Place 1 tablet (0.4 mg total) under the tongue every 5 (five) minutes x 3 doses as needed for chest pain.  25 tablet  3  . potassium chloride SA (K-DUR,KLOR-CON) 20 MEQ tablet Take 1 tablet (20 mEq total) by mouth daily.  30 tablet  3  . Ticagrelor (BRILINTA) 90 MG TABS tablet Take 1 tablet (90 mg total) by mouth 2 (two) times daily.  60 tablet  3   No current facility-administered  medications for this visit.    No Known Allergies  Past Medical History  Diagnosis Date  . Hypertension   . Fibromyalgia   . Shingles   . Obesity   . Hyperlipidemia   . CAD (coronary artery disease)     a. s/p DES x 2 to mid OM, residual RCA and LAD borderline CAD    Past Surgical History  Procedure Laterality Date  . Total hip arthroplasty Right 2005  . Appendectomy    . Coronary angioplasty with stent placement      sequential 90% then 95% mid OM lesions s/p DES x 2, residual 50% prox LAD and 50-70% mid RCA stenoses; LV gram deferred    History  Smoking status  . Never Smoker   Smokeless tobacco  . Not on file    History  Alcohol Use  . 1.2 oz/week  . 2 Shots of liquor per week    No family history on file.  Review of Systems: The review of systems is per the HPI.  All other systems were reviewed and are negative.  Physical Exam: BP 150/90  Pulse 63  Ht 6\' 1"  (1.854 m)  Wt 267 lb 6.4 oz (121.292 kg)  BMI 35.29 kg/m2  SpO2 97% Patient is very pleasant and in no  acute distress. He is obese. Skin is warm and dry. Color is normal.  HEENT is unremarkable. Normocephalic/atraumatic. PERRL. Sclera are nonicteric. Neck is supple. No masses. No JVD. Lungs are clear. Cardiac exam shows a regular rate and rhythm. Abdomen is soft. Extremities are without edema. Gait and ROM are intact. No gross neurologic deficits noted.  LABORATORY DATA: Pending  Lab Results  Component Value Date   WBC 7.5 04/16/2013   HGB 12.7* 04/16/2013   HCT 36.7* 04/16/2013   PLT 160 04/16/2013   GLUCOSE 108* 04/16/2013   CHOL 212* 04/16/2013   TRIG 112 04/16/2013   HDL 61 04/16/2013   LDLCALC 129* 04/16/2013   ALT 24 03/23/2007   AST 22 03/23/2007   NA 140 04/16/2013   K 3.4* 04/16/2013   CL 102 04/16/2013   CREATININE 0.70 04/16/2013   BUN 10 04/16/2013   CO2 26 04/16/2013   TSH 1.25 03/23/2007   PSA 2.52 03/23/2007   INR 1.02 04/14/2013   HGBA1C 5.8 03/23/2007    Lab Results   Component Value Date   TROPONINI 4.36* 04/16/2013    Echo Study Conclusions  - Left ventricle: The cavity size was normal. Wall thickness was increased in a pattern of mild LVH. Systolic function was normal. The estimated ejection fraction was in the range of 55% to 60%. Regional wall motion abnormalities cannot be excluded. Doppler parameters are consistent with abnormal left ventricular relaxation (grade 1 diastolic dysfunction). - Left atrium: The atrium was mildly dilated. - Right atrium: The atrium was mildly dilated.  Procedure: Catheter placement for coronary angiography, Selective Coronary Angiography, PTCA and stenting of the first OM  Indication: NSTEMI  Procedural Details: The right wrist was prepped, draped, and anesthetized with 1% lidocaine. Using the modified Seldinger technique, a 5/6 French sheath was introduced into the right radial artery. 3 mg of verapamil was administered through the sheath, weight-based unfractionated heparin was administered intravenously. The right subclavian artery was extremely tortuous. There was a 360 loop in the subclavian artery. I tried to use guide catheters with 0.035 inch wires for support to engage coronaries. This was unsuccessful. It was clear that the procedure would not be a success through the right radial access. At that point the left wrist was prepped, draped, and anesthetized with 1% lidocaine. A 5/6 French radial sheath was placed. Verapamil was administered through the sheath. Unfractionated heparin was administered. The JR 4 guide catheter was used for selective right coronary artery angiography. An XB 4 cm guide catheter was used for left coronary angiography. Catheter exchanges were performed over an exchange length guidewire.  PROCEDURAL FINDINGS  Hemodynamics:  AO 140/80  LV not recorded  Coronary angiography:  Coronary dominance: right  Left mainstem: Widely patent with no obstructive disease. Mild calcification. The  vessel divides into the LAD and left circumflex.  Left anterior descending (LAD): Normal caliber vessel reaching the left ventricular apex. Heavy calcification through the proximal segments. There is 50% proximal LAD stenosis. There is heavy associated calcification. The diagonal branch and mid LAD have diffuse irregularity with no evidence of high-grade stenosis. The LAD wraps around the left ventricular apex.  Left circumflex (LCx): The left circumflex gives off a very large obtuse marginal branch. The midportion of the OM has a 90% stenosis. Further down in the OM there is a sequential 95% stenosis. The LM divides into multiple vessels distally.  Right coronary artery (RCA): Normal caliber, dominant vessel. There is heavy mid vessel calcification. There is eccentric 50-70% stenosis  in the midportion. The distal vessel is patent. The PDA and PLA branches are small in caliber without significant disease.  Left ventriculography: Deferred  PCI Note: Following the diagnostic procedure, the decision was made to proceed with PCI. The radial sheath was upsized to a 6 [place]. Weight-based bivalirudin was given for anticoagulation. He was loaded with brilinta 180 mg. Once a therapeutic ACT was achieved, a 6 [place] XB 4 cm guide catheter was inserted. A cougar coronary guidewire was used to cross the lesion. The lesion was predilated with a 3.0 x 12 mm balloon. Guide and wire position were lost after balloon predilatation. A new guide catheter was inserted. The lesion was recrossed with the same cougar wire. The distal lesion was then stented with a 4.0 x 12 mm Promus Premier drug-eluting stent. The more proximal lesion was also stented with a 4.0 x 12 mm Promus Premier DES. Both lesions were postdilated with 4.5 cm noncompliant balloon to 16 atmospheres. Following PCI, there was 0% residual stenosis and TIMI-3 flow at both lesion sites. Final angiography confirmed an excellent result. The patient tolerated the  procedure well. There were no immediate procedural complications. A TR band was used for radial hemostasis. The patient was transferred to the post catheterization recovery area for further monitoring.  PCI Data:  Lesion 1  Vessel - OM/Segment - mid  Percent Stenosis (pre) 95  TIMI-flow 3  Stent 4.0x12 mm Promus DES  Percent Stenosis (post) 0  TIMI-flow (post) 3  Lesion 2  Vessel - OM/Segment - prox  Percent Stenosis (pre) 90  TIMI-flow 3  Stent 4.0x12 mm Promus DES  Percent Stenosis (post) 0  TIMI-flow (post) 3  Final Conclusions:  1. Severe single-vessel coronary artery disease with successful PCI of the left circumflex  2. Moderate LAD and RCA stenoses  Recommendations:  Dual antiplatelet therapy with aspirin and brilinta for at least 12 months. Aggressive medical therapy for the patient's residual coronary artery disease. We'll check a 2-D echocardiogram to assess left ventricular function.  Tonny Bollman  04/15/2013, 2:40 PM   Assessment / Plan:  1. NSTEMI with DES to proximal and mid OM lesion - has residual disease to manage medically - he is doing well. EF normal. No change in therapy. I have sent a note to cardiac rehab. Reviewed his activities, etc.   2. HLD - recheck fasting labs on return  3. HTN - recheck of his BP by me is down to 130/90. Not really wanting more medicines and is agreeable to monitoring at home. Consider starting ACE on return if elevated.   4. Obesity - discussed in depth.   Patient is agreeable to this plan and will call if any problems develop in the interim.   Rosalio Macadamia, RN, ANP-C [facility] [address] [place], [place]  [address]

## 2013-04-24 ENCOUNTER — Ambulatory Visit (INDEPENDENT_AMBULATORY_CARE_PROVIDER_SITE_OTHER): Payer: Medicare Other | Admitting: Internal Medicine

## 2013-04-24 ENCOUNTER — Encounter: Payer: Self-pay | Admitting: Internal Medicine

## 2013-04-24 VITALS — BP 124/65 | HR 71 | Temp 98.1°F | Wt 266.6 lb

## 2013-04-24 DIAGNOSIS — I2510 Atherosclerotic heart disease of native coronary artery without angina pectoris: Secondary | ICD-10-CM

## 2013-04-24 DIAGNOSIS — R7309 Other abnormal glucose: Secondary | ICD-10-CM

## 2013-04-24 DIAGNOSIS — E876 Hypokalemia: Secondary | ICD-10-CM

## 2013-04-24 DIAGNOSIS — I10 Essential (primary) hypertension: Secondary | ICD-10-CM

## 2013-04-24 DIAGNOSIS — R739 Hyperglycemia, unspecified: Secondary | ICD-10-CM | POA: Insufficient documentation

## 2013-04-24 DIAGNOSIS — E782 Mixed hyperlipidemia: Secondary | ICD-10-CM

## 2013-04-24 NOTE — Assessment & Plan Note (Signed)
Low glycemic carb program

## 2013-04-24 NOTE — Assessment & Plan Note (Signed)
A1c 1/27

## 2013-04-24 NOTE — Assessment & Plan Note (Signed)
Lipid labs 1/27

## 2013-04-24 NOTE — Progress Notes (Signed)
   Subjective:    Patient ID: Wyatt Mason, male    DOB: 14-Jan-1933, 79 y.o.   MRN: [id number]  HPI   The hospital records 12/21-12/23/14 were reviewed. He sustained a non STEMI; his troponin rose as high as 5.92. He had 2 stents placed for significant stenosis; there were also less severe 50-70% lesions as well which were not addressed. His ejection fraction is normal at 55-60%; ACE inhibitor therapy was not initiated.   His statin was increased from 20 mg to 40 mg of atorvastatin. At the time of the event his LDL was 129 & HDL 61. Good nutrition was suggested by triglycerides of 112.  He did have some mild hypokalemia with a value of 3.4 on HCTZ; this was supplemented. Followup 12/30 potassium was normal at 4.3.  Noted was mild hyperglycemia with values of 104-108.  Father had MI in his 38s & died @ 65.   Review of Systems A heart healthy diet had not been followed pre MI; insufficient exercise pre event.  Since discharge specifically denied are  chest pain, palpitations, dyspnea, or claudication .  Significant abdominal symptoms, memory deficit, or myalgias not present.  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     Objective:   Physical Exam Appears healthy and well-nourished & in no acute distress. Some weight excess centreally  No carotid bruits are present.No neck pain distention present at 10 - 15 degrees. Thyroid normal to palpation  Heart rhythm and rate are normal with no significant murmurs or gallops. S4  Chest is clear with no increased work of breathing  There is no evidence of aortic aneurysm or renal artery bruits  Abdomen soft with no organomegaly or masses. No HJR  No clubbing, cyanosis or edema present.  Pedal pulses are intact   No ischemic skin changes are present .   Alert and oriented. Strength, tone, DTRs reflexes 1/2+          Assessment & Plan:  See Current Assessment & Plan in Problem List under specific Diagnosis

## 2013-04-24 NOTE — Progress Notes (Signed)
Pre visit review using our clinic review tool, if applicable. No additional management support is needed unless otherwise documented below in the visit note. 

## 2013-04-24 NOTE — Patient Instructions (Signed)
Eat a low-fat diet with lots of fruits and vegetables, up to 7-9 servings per day. Avoid obesity; your goal is waist measurement < 40 inches.Consume less than 40 grams of sugar per day from foods & drinks with High Fructose Corn Sugar as #1,2,3 or # 4 on label. Follow the low carb nutrition program in [employer] as closely as possible to prevent Diabetes progression & complications. White carbohydrates (potatoes, rice, bread, and pasta) have a high spike of sugar and a high load of sugar. For example a  baked potato has a cup of sugar and a  french fry  2 teaspoons of sugar. Yams, wild  rice, whole grained bread &  wheat pasta have been much lower spike and load of  sugar. Portions should be the size of a deck of cards or your palm.  Please review Dr Gildardo Griffes book Eat, Drink & Be Healthy for best  dietary cholesterol information.

## 2013-04-24 NOTE — Assessment & Plan Note (Signed)
See goals BMET 1/27

## 2013-04-24 NOTE — Assessment & Plan Note (Signed)
BMET 1/27

## 2013-05-01 ENCOUNTER — Other Ambulatory Visit: Payer: Self-pay | Admitting: Internal Medicine

## 2013-05-01 NOTE — Telephone Encounter (Signed)
Metoprolol refilled per protocol. JG//CMA 

## 2013-05-01 NOTE — Telephone Encounter (Signed)
HCTZ refilled per protocol. JG//CMA 

## 2013-05-02 ENCOUNTER — Encounter (HOSPITAL_COMMUNITY)
Admission: RE | Admit: 2013-05-02 | Discharge: 2013-05-02 | Disposition: A | Discharge status: Home / Self Care | Payer: Medicare Other | Source: Ambulatory Visit | Attending: Cardiovascular Disease | Admitting: Cardiovascular Disease

## 2013-05-02 DIAGNOSIS — Z5189 Encounter for other specified aftercare: Secondary | ICD-10-CM | POA: Insufficient documentation

## 2013-05-02 DIAGNOSIS — Z9861 Coronary angioplasty status: Secondary | ICD-10-CM | POA: Insufficient documentation

## 2013-05-02 DIAGNOSIS — I214 Non-ST elevation (NSTEMI) myocardial infarction: Secondary | ICD-10-CM | POA: Insufficient documentation

## 2013-05-02 NOTE — Progress Notes (Signed)
Cardiac Rehab Medication Review by a Pharmacist  Does the patient  feel that his/her medications are working for him/her?  yes  Has the patient been experiencing any side effects to the medications prescribed?  no  Does the patient measure his/her own blood pressure or blood glucose at home?  yes   Does the patient have any problems obtaining medications due to transportation or finances?   no  Understanding of regimen: excellent Understanding of indications: excellent Potential of compliance: excellent    Pharmacist comments:  [id number] yoM who presents in good spirits today, has reviewed medication list. Newly started meds include lipitor and brilinta. Patient very aware of medications.I have updated all of his medications and allergies  Jacoya Bauman B. Artelia Larocheung, PharmD Clinical Pharmacist - Resident Pager: [phone] Phone: [phone] 05/02/2013 9:15 AM

## 2013-05-06 ENCOUNTER — Encounter (HOSPITAL_COMMUNITY)
Admission: RE | Admit: 2013-05-06 | Discharge: 2013-05-06 | Disposition: A | Discharge status: Home / Self Care | Payer: Medicare Other | Source: Ambulatory Visit | Attending: Cardiovascular Disease | Admitting: Cardiovascular Disease

## 2013-05-06 NOTE — Progress Notes (Signed)
Pt started cardiac rehab today.  Pt tolerated light exercise without difficulty. Telemetry rhythm Sinus without ectopy. Will continue to monitor the patient throughout  the program. Vital signs stable.

## 2013-05-08 ENCOUNTER — Encounter (HOSPITAL_COMMUNITY)
Admission: RE | Admit: 2013-05-08 | Discharge: 2013-05-08 | Disposition: A | Discharge status: Home / Self Care | Payer: Medicare Other | Source: Ambulatory Visit | Attending: Cardiovascular Disease | Admitting: Cardiovascular Disease

## 2013-05-10 ENCOUNTER — Encounter (HOSPITAL_COMMUNITY)
Admission: RE | Admit: 2013-05-10 | Discharge: 2013-05-10 | Disposition: A | Discharge status: Home / Self Care | Payer: Medicare Other | Source: Ambulatory Visit | Attending: Cardiovascular Disease | Admitting: Cardiovascular Disease

## 2013-05-13 ENCOUNTER — Encounter (HOSPITAL_COMMUNITY)
Admission: RE | Admit: 2013-05-13 | Discharge: 2013-05-13 | Disposition: A | Discharge status: Home / Self Care | Payer: Medicare Other | Source: Ambulatory Visit | Attending: Cardiovascular Disease | Admitting: Cardiovascular Disease

## 2013-05-13 NOTE — Progress Notes (Signed)
Nutrition Spoke with pt. Pt wants to know what he can eat. Pt given handouts for: MEDFICTS survey results and Nutrition I and II classes. Pt to review the information at home and return with questions prn. Continue client-centered nutrition education by RD as part of interdisciplinary care.  Monitor and evaluate progress toward nutrition goal with team.  Mickle PlumbEdna Eleaner Dibartolo, M.Ed, RD, LDN, CDE 05/13/2013 11:23 AM

## 2013-05-15 ENCOUNTER — Encounter (HOSPITAL_COMMUNITY)
Admission: RE | Admit: 2013-05-15 | Discharge: 2013-05-15 | Disposition: A | Discharge status: Home / Self Care | Payer: Medicare Other | Source: Ambulatory Visit | Attending: Cardiovascular Disease | Admitting: Cardiovascular Disease

## 2013-05-17 ENCOUNTER — Encounter (HOSPITAL_COMMUNITY)
Admission: RE | Admit: 2013-05-17 | Discharge: 2013-05-17 | Disposition: A | Discharge status: Home / Self Care | Payer: Medicare Other | Source: Ambulatory Visit | Attending: Cardiovascular Disease | Admitting: Cardiovascular Disease

## 2013-05-17 NOTE — Progress Notes (Signed)
Reviewed home exercise with pt today.  Pt plans to walk at home for exercise.  Reviewed THR, pulse, RPE, sign and symptoms, NTG use, and when to call 911 or MD.  Pt voiced understanding. Kaoir Loree, MA, ACSM RCEP   

## 2013-05-20 ENCOUNTER — Encounter (HOSPITAL_COMMUNITY)
Admission: RE | Admit: 2013-05-20 | Discharge: 2013-05-20 | Disposition: A | Discharge status: Home / Self Care | Payer: Medicare Other | Source: Ambulatory Visit | Attending: Cardiovascular Disease | Admitting: Cardiovascular Disease

## 2013-05-21 ENCOUNTER — Encounter: Payer: Self-pay | Admitting: Nurse Practitioner

## 2013-05-21 ENCOUNTER — Other Ambulatory Visit: Payer: Self-pay | Admitting: Nurse Practitioner

## 2013-05-21 ENCOUNTER — Encounter (INDEPENDENT_AMBULATORY_CARE_PROVIDER_SITE_OTHER): Payer: Self-pay

## 2013-05-21 ENCOUNTER — Ambulatory Visit (INDEPENDENT_AMBULATORY_CARE_PROVIDER_SITE_OTHER): Payer: Medicare Other | Admitting: Nurse Practitioner

## 2013-05-21 VITALS — BP 120/90 | HR 72 | Ht 72.0 in | Wt 255.0 lb

## 2013-05-21 DIAGNOSIS — I10 Essential (primary) hypertension: Secondary | ICD-10-CM

## 2013-05-21 DIAGNOSIS — Z131 Encounter for screening for diabetes mellitus: Secondary | ICD-10-CM

## 2013-05-21 DIAGNOSIS — E785 Hyperlipidemia, unspecified: Secondary | ICD-10-CM

## 2013-05-21 DIAGNOSIS — I214 Non-ST elevation (NSTEMI) myocardial infarction: Secondary | ICD-10-CM

## 2013-05-21 DIAGNOSIS — R7309 Other abnormal glucose: Secondary | ICD-10-CM

## 2013-05-21 LAB — BASIC METABOLIC PANEL
BUN: 11 mg/dL (ref 6–23)
CO2: 29 mEq/L (ref 19–32)
Calcium: 8.9 mg/dL (ref 8.4–10.5)
Chloride: 102 mEq/L (ref 96–112)
Creatinine, Ser: 0.9 mg/dL (ref 0.4–1.5)
GFR: 88.86 mL/min (ref >60.00)
Glucose, Bld: 114 mg/dL — ABNORMAL HIGH (ref 70–99)
Potassium: 4 mEq/L (ref 3.5–5.1)
Sodium: 138 mEq/L (ref 135–145)

## 2013-05-21 LAB — LIPID PANEL
Cholesterol: 122 mg/dL (ref 0–200)
HDL: 50.1 mg/dL (ref >39.00)
LDL Cholesterol: 57 mg/dL (ref 0–99)
Total CHOL/HDL Ratio: 2
Triglycerides: 75 mg/dL (ref 0.0–149.0)
VLDL: 15 mg/dL (ref 0.0–40.0)

## 2013-05-21 LAB — HEMOGLOBIN A1C: Hgb A1c MFr Bld: 5.9 % (ref 4.6–6.5)

## 2013-05-21 LAB — HEPATIC FUNCTION PANEL
ALT: 43 U/L (ref 0–53)
AST: 31 U/L (ref 0–37)
Albumin: 4.1 g/dL (ref 3.5–5.2)
Alkaline Phosphatase: 59 U/L (ref 39–117)
Bilirubin, Direct: 0.1 mg/dL (ref 0.0–0.3)
Total Bilirubin: 1.3 mg/dL — ABNORMAL HIGH (ref 0.3–1.2)
Total Protein: 7 g/dL (ref 6.0–8.3)

## 2013-05-21 NOTE — Progress Notes (Addendum)
Wyatt Mason Date of Birth: 04-Dec-1932 Medical Record #[id number]  History of Present Illness: Wyatt Mason is seen back today for a one month visit. Seen for Dr. Excell Seltzer. He has HTN, OA and obesity.   Presented with NSTEMI and had DES x 2 proximal 90% and mild 95% OM lesions in December of 2014. Did have residual disease - to manage medically. On DAPT with Brilinta for one year. ACE to be considered if EF remains reduced on follow up echo or if BP elevated. Statin was increased as well.   Seen a month ago and was doing well with no problems. Some mild dyspnea with his Brilinta early on but resolved at follow up visit.   Comes back today. Here with his wife. Needs fasting labs today. He is doing well. No chest pain. Not short of breath. Has really done well with cutting back on portions and changing his eating habits. Weight is doing down. Tolerating his medicines. BP at home looks good with systolics in the 120 to 140 range. No swelling.   Current Outpatient Prescriptions  Medication Sig Dispense Refill  . aspirin EC 81 MG tablet Take 81 mg by mouth daily.      Marland Kitchen atorvastatin (LIPITOR) 40 MG tablet Take 1 tablet (40 mg total) by mouth daily at 6 PM.  30 tablet  3  . Cholecalciferol (VITAMIN D-3) 1000 UNITS CAPS Take by mouth daily.      . hydrochlorothiazide (MICROZIDE) 12.5 MG capsule take 1 capsule by mouth once daily  90 capsule  1  . metoprolol tartrate (LOPRESSOR) 25 MG tablet Take 25 mg by mouth 2 (two) times daily.       . Multiple Vitamin (MULTIVITAMIN WITH MINERALS) TABS tablet Take 1 tablet by mouth daily.      . nitroGLYCERIN (NITROSTAT) 0.4 MG SL tablet Place 1 tablet (0.4 mg total) under the tongue every 5 (five) minutes x 3 doses as needed for chest pain.  25 tablet  3  . potassium chloride SA (K-DUR,KLOR-CON) 20 MEQ tablet Take 1 tablet (20 mEq total) by mouth daily.  30 tablet  3  . Ticagrelor (BRILINTA) 90 MG TABS tablet Take 1 tablet (90 mg total) by mouth 2 (two) times  daily.  60 tablet  3   No current facility-administered medications for this visit.    No Known Allergies  Past Medical History  Diagnosis Date  . Hypertension   . Fibromyalgia   . Shingles   . Obesity   . Hyperlipidemia   . CAD (coronary artery disease) 04/15/2013    a. s/p DES x 2 to mid OM, residual RCA and LAD borderline CAD    Past Surgical History  Procedure Laterality Date  . Total hip arthroplasty Right 2005  . Appendectomy    . Coronary angioplasty with stent placement  04/15/2013    sequential 90% then 95% mid OM lesions s/p DES x 2, residual 50% prox LAD and 50-70% mid RCA stenoses; LV gram deferred    History  Smoking status  . Never Smoker   Smokeless tobacco  . Not on file    History  Alcohol Use  . 1.2 oz/week  . 2 Shots of liquor per week    Family History  Problem Relation Age of Onset  . Heart attack Father     onset in 30s; died @58   . CVA Neg Hx     Review of Systems: The review of systems is per the HPI.  All  other systems were reviewed and are negative.  Physical Exam: BP 120/90  Pulse 72  Ht 6' (1.829 m)  Wt 255 lb (115.667 kg)  BMI 34.58 kg/m2  SpO2 96% Patient is very pleasant and in no acute distress. Weight is down 9 pounds from last visit. Skin is warm and dry. Color is normal.  HEENT is unremarkable. Normocephalic/atraumatic. PERRL. Sclera are nonicteric. Neck is supple. No masses. No JVD. Lungs are clear. Cardiac exam shows a regular rate and rhythm. Abdomen is soft. Extremities are without edema. Gait and ROM are intact. No gross neurologic deficits noted.  Wt Readings from Last 3 Encounters:  05/21/13 255 lb (115.667 kg)  05/02/13 264 lb 8.8 oz (120 kg)  04/24/13 266 lb 9.6 oz (120.929 kg)     LABORATORY DATA: PENDING  Lab Results  Component Value Date   WBC 6.0 04/23/2013   HGB 13.6 04/23/2013   HCT 39.8 04/23/2013   PLT 219.0 04/23/2013   GLUCOSE 104* 04/23/2013   CHOL 212* 04/16/2013   TRIG 112 04/16/2013    HDL 61 04/16/2013   LDLCALC 129* 04/16/2013   ALT 24 03/23/2007   AST 22 03/23/2007   NA 137 04/23/2013   K 4.3 04/23/2013   CL 100 04/23/2013   CREATININE 0.9 04/23/2013   BUN 16 04/23/2013   CO2 32 04/23/2013   TSH 1.25 03/23/2007   PSA 2.52 03/23/2007   INR 1.02 04/14/2013   HGBA1C 5.8 03/23/2007    Echo Study Conclusions  - Left ventricle: The cavity size was normal. Wall thickness was increased in a pattern of mild LVH. Systolic function was normal. The estimated ejection fraction was in the range of 55% to 60%. Regional wall motion abnormalities cannot be excluded. Doppler parameters are consistent with abnormal left ventricular relaxation (grade 1 diastolic dysfunction). - Left atrium: The atrium was mildly dilated. - Right atrium: The atrium was mildly dilated.  Selective Coronary Angiography, PTCA and stenting of the first OM  Indication: NSTEMI  Procedural Details: The right wrist was prepped, draped, and anesthetized with 1% lidocaine. Using the modified Seldinger technique, a 5/6 French sheath was introduced into the right radial artery. 3 mg of verapamil was administered through the sheath, weight-based unfractionated heparin was administered intravenously. The right subclavian artery was extremely tortuous. There was a 360 loop in the subclavian artery. I tried to use guide catheters with 0.035 inch wires for support to engage coronaries. This was unsuccessful. It was clear that the procedure would not be a success through the right radial access. At that point the left wrist was prepped, draped, and anesthetized with 1% lidocaine. A 5/6 French radial sheath was placed. Verapamil was administered through the sheath. Unfractionated heparin was administered. The JR 4 guide catheter was used for selective right coronary artery angiography. An XB 4 cm guide catheter was used for left coronary angiography. Catheter exchanges were performed over an exchange length guidewire.    PROCEDURAL FINDINGS  Hemodynamics:  AO 140/80  LV not recorded  Coronary angiography:  Coronary dominance: right  Left mainstem: Widely patent with no obstructive disease. Mild calcification. The vessel divides into the LAD and left circumflex.  Left anterior descending (LAD): Normal caliber vessel reaching the left ventricular apex. Heavy calcification through the proximal segments. There is 50% proximal LAD stenosis. There is heavy associated calcification. The diagonal branch and mid LAD have diffuse irregularity with no evidence of high-grade stenosis. The LAD wraps around the left ventricular apex.  Left circumflex (LCx):  The left circumflex gives off a very large obtuse marginal branch. The midportion of the OM has a 90% stenosis. Further down in the OM there is a sequential 95% stenosis. The LM divides into multiple vessels distally.  Right coronary artery (RCA): Normal caliber, dominant vessel. There is heavy mid vessel calcification. There is eccentric 50-70% stenosis in the midportion. The distal vessel is patent. The PDA and PLA branches are small in caliber without significant disease.  Left ventriculography: Deferred  PCI Note: Following the diagnostic procedure, the decision was made to proceed with PCI. The radial sheath was upsized to a 6 [place]. Weight-based bivalirudin was given for anticoagulation. He was loaded with brilinta 180 mg. Once a therapeutic ACT was achieved, a 6 [place] XB 4 cm guide catheter was inserted. A cougar coronary guidewire was used to cross the lesion. The lesion was predilated with a 3.0 x 12 mm balloon. Guide and wire position were lost after balloon predilatation. A new guide catheter was inserted. The lesion was recrossed with the same cougar wire. The distal lesion was then stented with a 4.0 x 12 mm Promus Premier drug-eluting stent. The more proximal lesion was also stented with a 4.0 x 12 mm Promus Premier DES. Both lesions were postdilated with 4.5 cm  noncompliant balloon to 16 atmospheres. Following PCI, there was 0% residual stenosis and TIMI-3 flow at both lesion sites. Final angiography confirmed an excellent result. The patient tolerated the procedure well. There were no immediate procedural complications. A TR band was used for radial hemostasis. The patient was transferred to the post catheterization recovery area for further monitoring.  PCI Data:  Lesion 1  Vessel - OM/Segment - mid  Percent Stenosis (pre) 95  TIMI-flow 3  Stent 4.0x12 mm Promus DES  Percent Stenosis (post) 0  TIMI-flow (post) 3  Lesion 2  Vessel - OM/Segment - prox  Percent Stenosis (pre) 90  TIMI-flow 3  Stent 4.0x12 mm Promus DES  Percent Stenosis (post) 0  TIMI-flow (post) 3  Final Conclusions:  1. Severe single-vessel coronary artery disease with successful PCI of the left circumflex  2. Moderate LAD and RCA stenoses  Recommendations:  Dual antiplatelet therapy with aspirin and brilinta for at least 12 months. Aggressive medical therapy for the patient's residual coronary artery disease. We'll check a 2-D echocardiogram to assess left ventricular function.  Tonny Bollman  04/15/2013, 2:40 PM    Assessment / Plan:  1. NSTEMI with DES to proximal and mid OM lesion - has residual disease to manage medically with normal LV function - remains in cardiac rehab - he is doing very well clinically. See him back in 4 months.   2. HLD - for fasting labs today   3. HTN -  BP looks ok at home - no change in current regimen. Would hope to even cut medicines back as he continues to actively lose weight.   4. Obesity - actively losing weight - he is congratulated and encouraged to keep up the good work.   Recheck fasting labs today. See him back in 4 months.   Patient is agreeable to this plan and will call if any problems develop in the interim.   Rosalio Macadamia, RN, ANP-C  [facility]  [address]    [place], [place] [address]    Addendum:  Note from cardiac rehab today - 06/03/2013  Good afternoon Wyatt Mason,   I sent Wyatt Mason blood pressure over for you  to take a look at. Wyatt Mason was a little low post exercise today and has been getting some lower readings at home. You mentioned in your dictation that you may consider adjusting his medications.   BP flowsheets reviewed from rehab - systolics down in the 90's to low 100's - will stop his HCTZ and continue to monitor.

## 2013-05-21 NOTE — Addendum Note (Signed)
Addended by: Zhamir Pirro K on: 05/21/2013 09:40 AM   Modules accepted: Orders  

## 2013-05-21 NOTE — Addendum Note (Signed)
Addended by: Tonita PhoenixBOWDEN, Jahzir Strohmeier K on: 05/21/2013 09:40 AM   Modules accepted: Orders

## 2013-05-21 NOTE — Patient Instructions (Signed)
Stay on your current medicines  We will check fasting labs today  See Dr. Excell Seltzerooper in 4 months  Call the [facility] office at [phone] if you have any questions, problems or concerns.

## 2013-05-22 ENCOUNTER — Encounter: Payer: Self-pay | Admitting: *Deleted

## 2013-05-22 ENCOUNTER — Encounter (HOSPITAL_COMMUNITY)
Admission: RE | Admit: 2013-05-22 | Discharge: 2013-05-22 | Disposition: A | Discharge status: Home / Self Care | Payer: Medicare Other | Source: Ambulatory Visit | Attending: Cardiovascular Disease | Admitting: Cardiovascular Disease

## 2013-05-24 ENCOUNTER — Encounter (HOSPITAL_COMMUNITY)
Admission: RE | Admit: 2013-05-24 | Discharge: 2013-05-24 | Disposition: A | Discharge status: Home / Self Care | Payer: Medicare Other | Source: Ambulatory Visit | Attending: Cardiovascular Disease | Admitting: Cardiovascular Disease

## 2013-05-27 ENCOUNTER — Encounter (HOSPITAL_COMMUNITY)
Admission: RE | Admit: 2013-05-27 | Discharge: 2013-05-27 | Disposition: A | Discharge status: Home / Self Care | Payer: Medicare Other | Source: Ambulatory Visit | Attending: Cardiovascular Disease | Admitting: Cardiovascular Disease

## 2013-05-27 DIAGNOSIS — I214 Non-ST elevation (NSTEMI) myocardial infarction: Secondary | ICD-10-CM | POA: Insufficient documentation

## 2013-05-27 DIAGNOSIS — Z5189 Encounter for other specified aftercare: Secondary | ICD-10-CM | POA: Insufficient documentation

## 2013-05-27 DIAGNOSIS — Z9861 Coronary angioplasty status: Secondary | ICD-10-CM | POA: Insufficient documentation

## 2013-05-29 ENCOUNTER — Encounter (HOSPITAL_COMMUNITY)
Admission: RE | Admit: 2013-05-29 | Discharge: 2013-05-29 | Disposition: A | Discharge status: Home / Self Care | Payer: Medicare Other | Source: Ambulatory Visit | Attending: Cardiovascular Disease | Admitting: Cardiovascular Disease

## 2013-05-31 ENCOUNTER — Encounter (HOSPITAL_COMMUNITY)
Admission: RE | Admit: 2013-05-31 | Discharge: 2013-05-31 | Disposition: A | Discharge status: Home / Self Care | Payer: Medicare Other | Source: Ambulatory Visit | Attending: Cardiovascular Disease | Admitting: Cardiovascular Disease

## 2013-05-31 NOTE — Progress Notes (Signed)
Wyatt Mason 80 y.o. male Nutrition Note Spoke with pt.  Nutrition Plan and Nutrition Survey goals reviewed with pt. Pt is following Step 1 of the Therapeutic Lifestyle Changes diet. Pt wants to lose wt. Pt has been trying to lose wt by "watching portion sizes." Wt loss tips reviewed. Pt is pre-diabetic according to his most recent A1c. Pre-diabetes discussed. Pt expressed understanding of the information reviewed. Pt aware of nutrition education classes offered. Lab Results  Component Value Date   CHOL 122 05/21/2013   HDL 50.10 05/21/2013   LDLCALC 57 05/21/2013   TRIG 75.0 05/21/2013   CHOLHDL 2 05/21/2013   Lab Results  Component Value Date   HGBA1C 5.9 05/21/2013  Nutrition Diagnosis   Food-and nutrition-related knowledge deficit related to lack of exposure to information as related to diagnosis of: ? CVD    Obesity related to excessive energy intake as evidenced by a BMI of 36.1 Nutrition RX/ Estimated Daily Nutrition Needs for: wt loss  1750-2250 Kcal, 45-60 gm fat, 11-15 gm sat fat, 1.7-2.2 gm trans-fat, <1500 mg sodium  Nutrition Intervention   Pt's individual nutrition plan including cholesterol goals reviewed with pt.   Benefits of adopting Therapeutic Lifestyle Changes discussed when Medficts reviewed.   Pt to attend the Portion Distortion class   Pt given handouts for: ? Nutrition I class ? Nutrition II class   Continue client-centered nutrition education by RD, as part of interdisciplinary care. Goal(s)   Pt to identify and limit food sources of saturated fat, trans fat, and cholesterol   Pt to identify food quantities necessary to achieve: ? wt loss to a goal wt of 240-258 lb (109.1-117.3 kg) at graduation from cardiac rehab.   Monitor and Evaluate progress toward nutrition goal with team. Nutrition Risk:  Low  Mickle PlumbEdna Zyiah Withington, M.Ed, RD, LDN, CDE 05/31/2013 9:59 AM

## 2013-06-03 ENCOUNTER — Encounter (HOSPITAL_COMMUNITY)
Admission: RE | Admit: 2013-06-03 | Discharge: 2013-06-03 | Disposition: A | Discharge status: Home / Self Care | Payer: Medicare Other | Source: Ambulatory Visit | Attending: Cardiovascular Disease | Admitting: Cardiovascular Disease

## 2013-06-03 ENCOUNTER — Telehealth: Payer: Self-pay | Admitting: *Deleted

## 2013-06-03 NOTE — Addendum Note (Signed)
Addended by: Rosalio MacadamiaGERHARDT, Clevie Prout C on: 06/03/2013 01:55 PM   Modules accepted: Orders, Medications

## 2013-06-03 NOTE — Progress Notes (Signed)
Post exercise blood pressure 94/58. Patient asymptomatic. Patient given water repeat blood pressure 96/62 via automatic blood pressure cuff. [name] reports  That his blood pressure's have been running lower at home. Repeat blood pressure 116/73 after Gatorade given. Dr Earmon Phoenixooper's office called and notified of low bp.   Will fax exercise flow sheets for [name] ANP-C to review. [name] has lost 2.2kg's since beginning exercise at cardiac rehab. [name] mentioned in her office note about considering adjusting [name]'s medication if necessary.

## 2013-06-03 NOTE — Telephone Encounter (Signed)
S/w pt is agreeable to plan will stop taking HCTZ and will continue to monitor bp at home.  Already has a recall 4  Months with Dr. Excell Seltzerooper pt aware

## 2013-06-03 NOTE — Telephone Encounter (Signed)
Message copied by Debbe BalesGONZALEZ, DANIELLE R on Mon Jun 03, 2013  2:20 PM ------      Message from: Rosalio MacadamiaGERHARDT, LORI C      Created: Mon Jun 03, 2013  1:55 PM      Regarding: RE: blood pressures from cardiac rehab       Duwayne HeckDanielle,      Can you call Mr. Senteno and ask him to stop taking his HCTZ - he needs to continue to monitor his BP.  See back as planned.             Lawson FiscalLori                  ----- Message -----         From: Cammy CopaMaria W Whitaker, RN         Sent: 06/03/2013  12:30 PM           To: Rosalio MacadamiaLori C Gerhardt, NP      Subject: blood pressures from cardiac rehab                       Good afternoon Lawson FiscalLori,            I sent Mr Zagami's blood pressure over for you to take a look at. Mr Manson PasseyBrown was a little low post exercise today and has been getting some lower readings at home. You mentioned in your dictation that you may consider adjusting his medications.                        Thanks for your help!            Byrd HesselbachMaria       ------

## 2013-06-05 ENCOUNTER — Encounter (HOSPITAL_COMMUNITY)
Admission: RE | Admit: 2013-06-05 | Discharge: 2013-06-05 | Disposition: A | Discharge status: Home / Self Care | Payer: Medicare Other | Source: Ambulatory Visit | Attending: Cardiovascular Disease | Admitting: Cardiovascular Disease

## 2013-06-07 ENCOUNTER — Encounter (HOSPITAL_COMMUNITY)
Admission: RE | Admit: 2013-06-07 | Discharge: 2013-06-07 | Disposition: A | Discharge status: Home / Self Care | Payer: Medicare Other | Source: Ambulatory Visit | Attending: Cardiovascular Disease | Admitting: Cardiovascular Disease

## 2013-06-10 ENCOUNTER — Encounter (HOSPITAL_COMMUNITY): Payer: Medicare Other

## 2013-06-12 ENCOUNTER — Encounter (HOSPITAL_COMMUNITY)
Admission: RE | Admit: 2013-06-12 | Discharge: 2013-06-12 | Disposition: A | Discharge status: Home / Self Care | Payer: Medicare Other | Source: Ambulatory Visit | Attending: Cardiovascular Disease | Admitting: Cardiovascular Disease

## 2013-06-14 ENCOUNTER — Encounter (HOSPITAL_COMMUNITY)
Admission: RE | Admit: 2013-06-14 | Discharge: 2013-06-14 | Disposition: A | Discharge status: Home / Self Care | Payer: Medicare Other | Source: Ambulatory Visit | Attending: Cardiovascular Disease | Admitting: Cardiovascular Disease

## 2013-06-17 ENCOUNTER — Encounter (HOSPITAL_COMMUNITY)
Admission: RE | Admit: 2013-06-17 | Discharge: 2013-06-17 | Disposition: A | Discharge status: Home / Self Care | Payer: Medicare Other | Source: Ambulatory Visit | Attending: Cardiovascular Disease | Admitting: Cardiovascular Disease

## 2013-06-19 ENCOUNTER — Encounter (HOSPITAL_COMMUNITY)
Admission: RE | Admit: 2013-06-19 | Discharge: 2013-06-19 | Disposition: A | Discharge status: Home / Self Care | Payer: Medicare Other | Source: Ambulatory Visit | Attending: Cardiovascular Disease | Admitting: Cardiovascular Disease

## 2013-06-21 ENCOUNTER — Encounter (HOSPITAL_COMMUNITY)
Admission: RE | Admit: 2013-06-21 | Discharge: 2013-06-21 | Disposition: A | Discharge status: Home / Self Care | Payer: Medicare Other | Source: Ambulatory Visit | Attending: Cardiovascular Disease | Admitting: Cardiovascular Disease

## 2013-06-24 ENCOUNTER — Encounter (HOSPITAL_COMMUNITY)
Admission: RE | Admit: 2013-06-24 | Discharge: 2013-06-24 | Disposition: A | Discharge status: Home / Self Care | Payer: Medicare Other | Source: Ambulatory Visit | Attending: Cardiovascular Disease | Admitting: Cardiovascular Disease

## 2013-06-24 DIAGNOSIS — I214 Non-ST elevation (NSTEMI) myocardial infarction: Secondary | ICD-10-CM | POA: Insufficient documentation

## 2013-06-24 DIAGNOSIS — Z5189 Encounter for other specified aftercare: Secondary | ICD-10-CM | POA: Insufficient documentation

## 2013-06-24 DIAGNOSIS — Z9861 Coronary angioplasty status: Secondary | ICD-10-CM | POA: Insufficient documentation

## 2013-06-26 ENCOUNTER — Encounter (HOSPITAL_COMMUNITY)
Admission: RE | Admit: 2013-06-26 | Discharge: 2013-06-26 | Disposition: A | Discharge status: Home / Self Care | Payer: Medicare Other | Source: Ambulatory Visit | Attending: Cardiovascular Disease | Admitting: Cardiovascular Disease

## 2013-06-28 ENCOUNTER — Encounter (HOSPITAL_COMMUNITY)
Admission: RE | Admit: 2013-06-28 | Discharge: 2013-06-28 | Disposition: A | Discharge status: Home / Self Care | Payer: Medicare Other | Source: Ambulatory Visit | Attending: Cardiovascular Disease | Admitting: Cardiovascular Disease

## 2013-07-01 ENCOUNTER — Encounter (HOSPITAL_COMMUNITY)
Admission: RE | Admit: 2013-07-01 | Discharge: 2013-07-01 | Disposition: A | Discharge status: Home / Self Care | Payer: Medicare Other | Source: Ambulatory Visit | Attending: Cardiovascular Disease | Admitting: Cardiovascular Disease

## 2013-07-03 ENCOUNTER — Encounter (HOSPITAL_COMMUNITY)
Admission: RE | Admit: 2013-07-03 | Discharge: 2013-07-03 | Disposition: A | Discharge status: Home / Self Care | Payer: Medicare Other | Source: Ambulatory Visit | Attending: Cardiovascular Disease | Admitting: Cardiovascular Disease

## 2013-07-04 ENCOUNTER — Encounter: Payer: Self-pay | Admitting: Nurse Practitioner

## 2013-07-05 ENCOUNTER — Encounter (HOSPITAL_COMMUNITY)
Admission: RE | Admit: 2013-07-05 | Discharge: 2013-07-05 | Disposition: A | Discharge status: Home / Self Care | Payer: Medicare Other | Source: Ambulatory Visit | Attending: Cardiovascular Disease | Admitting: Cardiovascular Disease

## 2013-07-08 ENCOUNTER — Encounter (HOSPITAL_COMMUNITY)
Admission: RE | Admit: 2013-07-08 | Discharge: 2013-07-08 | Disposition: A | Discharge status: Home / Self Care | Payer: Medicare Other | Source: Ambulatory Visit | Attending: Cardiovascular Disease | Admitting: Cardiovascular Disease

## 2013-07-10 ENCOUNTER — Encounter (HOSPITAL_COMMUNITY)
Admission: RE | Admit: 2013-07-10 | Discharge: 2013-07-10 | Disposition: A | Discharge status: Home / Self Care | Payer: Medicare Other | Source: Ambulatory Visit | Attending: Cardiovascular Disease | Admitting: Cardiovascular Disease

## 2013-07-12 ENCOUNTER — Encounter (HOSPITAL_COMMUNITY)
Admission: RE | Admit: 2013-07-12 | Discharge: 2013-07-12 | Disposition: A | Discharge status: Home / Self Care | Payer: Medicare Other | Source: Ambulatory Visit | Attending: Cardiovascular Disease | Admitting: Cardiovascular Disease

## 2013-07-15 ENCOUNTER — Encounter (HOSPITAL_COMMUNITY)
Admission: RE | Admit: 2013-07-15 | Discharge: 2013-07-15 | Disposition: A | Discharge status: Home / Self Care | Payer: Medicare Other | Source: Ambulatory Visit | Attending: Cardiovascular Disease | Admitting: Cardiovascular Disease

## 2013-07-17 ENCOUNTER — Encounter (HOSPITAL_COMMUNITY)
Admission: RE | Admit: 2013-07-17 | Discharge: 2013-07-17 | Disposition: A | Discharge status: Home / Self Care | Payer: Medicare Other | Source: Ambulatory Visit | Attending: Cardiovascular Disease | Admitting: Cardiovascular Disease

## 2013-07-19 ENCOUNTER — Encounter (HOSPITAL_COMMUNITY)
Admission: RE | Admit: 2013-07-19 | Discharge: 2013-07-19 | Disposition: A | Discharge status: Home / Self Care | Payer: Medicare Other | Source: Ambulatory Visit | Attending: Cardiovascular Disease | Admitting: Cardiovascular Disease

## 2013-07-22 ENCOUNTER — Encounter (HOSPITAL_COMMUNITY)
Admission: RE | Admit: 2013-07-22 | Discharge: 2013-07-22 | Disposition: A | Discharge status: Home / Self Care | Payer: Medicare Other | Source: Ambulatory Visit | Attending: Cardiovascular Disease | Admitting: Cardiovascular Disease

## 2013-07-24 ENCOUNTER — Encounter (HOSPITAL_COMMUNITY)
Admission: RE | Admit: 2013-07-24 | Discharge: 2013-07-24 | Disposition: A | Discharge status: Home / Self Care | Payer: Medicare Other | Source: Ambulatory Visit | Attending: Cardiovascular Disease | Admitting: Cardiovascular Disease

## 2013-07-24 DIAGNOSIS — Z9861 Coronary angioplasty status: Secondary | ICD-10-CM | POA: Insufficient documentation

## 2013-07-24 DIAGNOSIS — Z5189 Encounter for other specified aftercare: Secondary | ICD-10-CM | POA: Insufficient documentation

## 2013-07-24 DIAGNOSIS — I214 Non-ST elevation (NSTEMI) myocardial infarction: Secondary | ICD-10-CM | POA: Insufficient documentation

## 2013-07-26 ENCOUNTER — Encounter (HOSPITAL_COMMUNITY)
Admission: RE | Admit: 2013-07-26 | Discharge: 2013-07-26 | Disposition: A | Discharge status: Home / Self Care | Payer: Medicare Other | Source: Ambulatory Visit | Attending: Cardiovascular Disease | Admitting: Cardiovascular Disease

## 2013-07-29 ENCOUNTER — Encounter (HOSPITAL_COMMUNITY)
Admission: RE | Admit: 2013-07-29 | Discharge: 2013-07-29 | Disposition: A | Discharge status: Home / Self Care | Payer: Medicare Other | Source: Ambulatory Visit | Attending: Cardiovascular Disease | Admitting: Cardiovascular Disease

## 2013-07-29 ENCOUNTER — Telehealth: Payer: Self-pay | Admitting: Nurse Practitioner

## 2013-07-29 NOTE — Progress Notes (Signed)
Wyatt Mason graduates today. Wyatt Mason plans to continue exercise in the maintenance program at the end of the month.

## 2013-07-29 NOTE — Telephone Encounter (Signed)
Message copied by Rosalio MacadamiaGERHARDT, Reika Callanan C on Mon Jul 29, 2013 10:56 AM ------      Message from: Cammy CopaWHITAKER, MARIA W      Created: Mon Jul 29, 2013  9:41 AM      Regarding: k supplement       Good morning Lawson FiscalLori,      Mr Melvyn NethLewis graduates today from cardiac rehab. Mr Melvyn NethLewis is still taking a potassium supplement 10 meq po qd. Does Mr Melvyn NethLewis need to continue to take this?             Thanks for your assistance,            Byrd HesselbachMaria ------

## 2013-07-29 NOTE — Telephone Encounter (Signed)
Would continue with all of his current medicines until seen back in follow up with repeat labs.

## 2013-07-31 ENCOUNTER — Encounter (HOSPITAL_COMMUNITY): Payer: Medicare Other

## 2013-08-02 ENCOUNTER — Encounter (HOSPITAL_COMMUNITY): Payer: Medicare Other

## 2013-08-02 NOTE — Telephone Encounter (Signed)
Will ask my CMA to call and give this instruction.

## 2013-08-05 ENCOUNTER — Encounter (HOSPITAL_COMMUNITY): Payer: Medicare Other

## 2013-08-05 MED ORDER — TICAGRELOR 90 MG PO TABS: 90.0000 mg | ORAL_TABLET | Freq: Two times a day (BID) | ORAL | Status: DC

## 2013-08-05 NOTE — Telephone Encounter (Signed)
S/w pt aware of Lori's instructions and also refilled brilinta ( 90 mg) bid sent to [facility] on [address]

## 2013-08-05 NOTE — Telephone Encounter (Signed)
New message  ° ° °Patient calling back to speak with nurse  °

## 2013-08-07 ENCOUNTER — Encounter (HOSPITAL_COMMUNITY): Payer: Medicare Other

## 2013-08-09 ENCOUNTER — Encounter (HOSPITAL_COMMUNITY): Payer: Medicare Other

## 2013-08-26 ENCOUNTER — Encounter (HOSPITAL_COMMUNITY)
Admission: RE | Admit: 2013-08-26 | Discharge: 2013-08-26 | Disposition: A | Discharge status: Home / Self Care | Payer: Self-pay | Source: Ambulatory Visit | Attending: Cardiovascular Disease | Admitting: Cardiovascular Disease

## 2013-08-26 DIAGNOSIS — I2510 Atherosclerotic heart disease of native coronary artery without angina pectoris: Secondary | ICD-10-CM | POA: Insufficient documentation

## 2013-08-26 DIAGNOSIS — Z5189 Encounter for other specified aftercare: Secondary | ICD-10-CM | POA: Insufficient documentation

## 2013-08-26 DIAGNOSIS — I252 Old myocardial infarction: Secondary | ICD-10-CM | POA: Insufficient documentation

## 2013-08-28 ENCOUNTER — Encounter (HOSPITAL_COMMUNITY)
Admission: RE | Admit: 2013-08-28 | Discharge: 2013-08-28 | Disposition: A | Discharge status: Home / Self Care | Payer: Self-pay | Source: Ambulatory Visit | Attending: Cardiovascular Disease | Admitting: Cardiovascular Disease

## 2013-08-30 ENCOUNTER — Encounter (HOSPITAL_COMMUNITY)
Admission: RE | Admit: 2013-08-30 | Discharge: 2013-08-30 | Disposition: A | Discharge status: Home / Self Care | Payer: Self-pay | Source: Ambulatory Visit | Attending: Cardiovascular Disease | Admitting: Cardiovascular Disease

## 2013-09-02 ENCOUNTER — Encounter (HOSPITAL_COMMUNITY)
Admission: RE | Admit: 2013-09-02 | Discharge: 2013-09-02 | Disposition: A | Discharge status: Home / Self Care | Payer: Self-pay | Source: Ambulatory Visit | Attending: Cardiovascular Disease | Admitting: Cardiovascular Disease

## 2013-09-04 ENCOUNTER — Other Ambulatory Visit: Payer: Self-pay

## 2013-09-04 ENCOUNTER — Encounter (HOSPITAL_COMMUNITY)
Admission: RE | Admit: 2013-09-04 | Discharge: 2013-09-04 | Disposition: A | Discharge status: Home / Self Care | Payer: Self-pay | Source: Ambulatory Visit | Attending: Cardiovascular Disease | Admitting: Cardiovascular Disease

## 2013-09-04 MED ORDER — ATORVASTATIN CALCIUM 40 MG PO TABS: 40.0000 mg | ORAL_TABLET | Freq: Every day | ORAL | Status: DC

## 2013-09-06 ENCOUNTER — Encounter (HOSPITAL_COMMUNITY)
Admission: RE | Admit: 2013-09-06 | Discharge: 2013-09-06 | Disposition: A | Discharge status: Home / Self Care | Payer: Self-pay | Source: Ambulatory Visit | Attending: Cardiovascular Disease | Admitting: Cardiovascular Disease

## 2013-09-09 ENCOUNTER — Encounter (HOSPITAL_COMMUNITY)
Admission: RE | Admit: 2013-09-09 | Discharge: 2013-09-09 | Disposition: A | Discharge status: Home / Self Care | Payer: Self-pay | Source: Ambulatory Visit | Attending: Cardiovascular Disease | Admitting: Cardiovascular Disease

## 2013-09-11 ENCOUNTER — Encounter (HOSPITAL_COMMUNITY)
Admission: RE | Admit: 2013-09-11 | Discharge: 2013-09-11 | Disposition: A | Discharge status: Home / Self Care | Payer: Self-pay | Source: Ambulatory Visit | Attending: Cardiovascular Disease | Admitting: Cardiovascular Disease

## 2013-09-13 ENCOUNTER — Encounter (HOSPITAL_COMMUNITY)
Admission: RE | Admit: 2013-09-13 | Discharge: 2013-09-13 | Disposition: A | Discharge status: Home / Self Care | Payer: Self-pay | Source: Ambulatory Visit | Attending: Cardiovascular Disease | Admitting: Cardiovascular Disease

## 2013-09-18 ENCOUNTER — Ambulatory Visit: Payer: Medicare Other | Admitting: Cardiovascular Disease

## 2013-09-18 ENCOUNTER — Encounter (HOSPITAL_COMMUNITY)
Admission: RE | Admit: 2013-09-18 | Discharge: 2013-09-18 | Disposition: A | Discharge status: Home / Self Care | Payer: Self-pay | Source: Ambulatory Visit | Attending: Cardiovascular Disease | Admitting: Cardiovascular Disease

## 2013-09-20 ENCOUNTER — Encounter (HOSPITAL_COMMUNITY)
Admission: RE | Admit: 2013-09-20 | Discharge: 2013-09-20 | Disposition: A | Discharge status: Home / Self Care | Payer: Self-pay | Source: Ambulatory Visit | Attending: Cardiovascular Disease | Admitting: Cardiovascular Disease

## 2013-09-23 ENCOUNTER — Encounter (HOSPITAL_COMMUNITY)
Admission: RE | Admit: 2013-09-23 | Discharge: 2013-09-23 | Disposition: A | Discharge status: Home / Self Care | Payer: Self-pay | Source: Ambulatory Visit | Attending: Cardiovascular Disease | Admitting: Cardiovascular Disease

## 2013-09-23 DIAGNOSIS — I10 Essential (primary) hypertension: Secondary | ICD-10-CM | POA: Insufficient documentation

## 2013-09-23 DIAGNOSIS — Z5189 Encounter for other specified aftercare: Secondary | ICD-10-CM | POA: Insufficient documentation

## 2013-09-23 DIAGNOSIS — E785 Hyperlipidemia, unspecified: Secondary | ICD-10-CM | POA: Insufficient documentation

## 2013-09-23 DIAGNOSIS — I252 Old myocardial infarction: Secondary | ICD-10-CM | POA: Insufficient documentation

## 2013-09-23 DIAGNOSIS — I2510 Atherosclerotic heart disease of native coronary artery without angina pectoris: Secondary | ICD-10-CM | POA: Insufficient documentation

## 2013-09-24 ENCOUNTER — Ambulatory Visit (INDEPENDENT_AMBULATORY_CARE_PROVIDER_SITE_OTHER): Payer: Medicare Other | Admitting: Cardiovascular Disease

## 2013-09-24 ENCOUNTER — Encounter (INDEPENDENT_AMBULATORY_CARE_PROVIDER_SITE_OTHER): Payer: Self-pay

## 2013-09-24 ENCOUNTER — Encounter: Payer: Self-pay | Admitting: Cardiovascular Disease

## 2013-09-24 VITALS — BP 138/78 | HR 56 | Ht 72.0 in | Wt 245.0 lb

## 2013-09-24 DIAGNOSIS — I10 Essential (primary) hypertension: Secondary | ICD-10-CM

## 2013-09-24 DIAGNOSIS — E782 Mixed hyperlipidemia: Secondary | ICD-10-CM

## 2013-09-24 DIAGNOSIS — I2510 Atherosclerotic heart disease of native coronary artery without angina pectoris: Secondary | ICD-10-CM

## 2013-09-24 NOTE — Patient Instructions (Signed)
Your physician wants you to follow-up in: 6 MONTHS with Dr Cooper.  You will receive a reminder letter in the mail two months in advance. If you don't receive a letter, please call our office to schedule the follow-up appointment.  Your physician recommends that you continue on your current medications as directed. Please refer to the Current Medication list given to you today.  

## 2013-09-24 NOTE — Progress Notes (Signed)
HPI:  80 year old gentleman presenting for followup evaluation. The patient is followed for coronary artery disease and initially presented with a non-ST elevation infarction in December 2014. He was found to have moderate stenosis in the LAD and right coronary artery and critical stenosis in the left circumflex distribution. He was treated with drug-eluting stents in the obtuse marginal branches of the left circumflex. Medical therapy was recommended for his residual disease. He's been treated with aspirin and brilinta. He presents today for followup evaluation.  The patient is doing very well. He is exercising regularly in the maintenance phase of cardiac rehabilitation. He has no exertional chest pain or pressure. He has lost about 30 pounds with a diet and exercise program. He's been compliant with his medications. He brings in blood pressure readings which show excellent control.   Outpatient Encounter Prescriptions as of 09/24/2013  Medication Sig  . aspirin EC 81 MG tablet Take 81 mg by mouth daily.  Marland Kitchen atorvastatin (LIPITOR) 40 MG tablet Take 1 tablet (40 mg total) by mouth daily at 6 PM.  . Cholecalciferol (VITAMIN D-3) 1000 UNITS CAPS Take by mouth daily.  . metoprolol tartrate (LOPRESSOR) 25 MG tablet Take 25 mg by mouth 2 (two) times daily.   . Multiple Vitamin (MULTIVITAMIN WITH MINERALS) TABS tablet Take 1 tablet by mouth daily.  . nitroGLYCERIN (NITROSTAT) 0.4 MG SL tablet Place 1 tablet (0.4 mg total) under the tongue every 5 (five) minutes x 3 doses as needed for chest pain.  . potassium chloride SA (K-DUR,KLOR-CON) 20 MEQ tablet Take 10 mEq by mouth daily.  . Ticagrelor (BRILINTA) 90 MG TABS tablet Take 1 tablet (90 mg total) by mouth 2 (two) times daily.    No Known Allergies  Past Medical History  Diagnosis Date  . Hypertension   . Fibromyalgia   . Shingles   . Obesity   . Hyperlipidemia   . CAD (coronary artery disease) 04/15/2013    a. s/p DES x 2 to mid OM,  residual RCA and LAD borderline CAD    ROS: Negative except as per HPI  BP 138/78  Pulse 56  Ht 6' (1.829 m)  Wt 111.131 kg (245 lb)  BMI 33.22 kg/m2  PHYSICAL EXAM: Pt is alert and oriented, NAD HEENT: normal Neck: JVP - normal, carotids 2+= without bruits Lungs: CTA bilaterally CV: Bradycardic and regular without murmur or gallop Abd: soft, NT, Positive BS, no hepatomegaly Ext: no C/C/E, distal pulses intact and equal Skin: warm/dry no rash  EKG: Sinus bradycardia with first degree AV block, heart rate 56 beats per minute.  Last lipids 05/21/2013: Lipid Panel     Component Value Date/Time   CHOL 122 05/21/2013 0937   TRIG 75.0 05/21/2013 0937   HDL 50.10 05/21/2013 0937   CHOLHDL 2 05/21/2013 0937   VLDL 15.0 05/21/2013 0937   LDLCALC 57 05/21/2013 0937   Cardiac Catheterization Procedure Note  Name: Wyatt Mason  MRN: [id number]  DOB: [birth year]   Procedure: Catheter placement for coronary angiography, Selective Coronary Angiography, PTCA and stenting of the first OM  Indication: NSTEMI  Procedural Details: The right wrist was prepped, draped, and anesthetized with 1% lidocaine. Using the modified Seldinger technique, a 5/6 French sheath was introduced into the right radial artery. 3 mg of verapamil was administered through the sheath, weight-based unfractionated heparin was administered intravenously. The right subclavian artery was extremely tortuous. There was a 360 loop in the subclavian artery. I tried to use guide catheters with  0.035 inch wires for support to engage coronaries. This was unsuccessful. It was clear that the procedure would not be a success through the right radial access. At that point the left wrist was prepped, draped, and anesthetized with 1% lidocaine. A 5/6 French radial sheath was placed. Verapamil was administered through the sheath. Unfractionated heparin was administered. The JR 4 guide catheter was used for selective right coronary artery  angiography. An XB 4 cm guide catheter was used for left coronary angiography. Catheter exchanges were performed over an exchange length guidewire.  PROCEDURAL FINDINGS  Hemodynamics:  AO 140/80  LV not recorded  Coronary angiography:  Coronary dominance: right  Left mainstem: Widely patent with no obstructive disease. Mild calcification. The vessel divides into the LAD and left circumflex.  Left anterior descending (LAD): Normal caliber vessel reaching the left ventricular apex. Heavy calcification through the proximal segments. There is 50% proximal LAD stenosis. There is heavy associated calcification. The diagonal branch and mid LAD have diffuse irregularity with no evidence of high-grade stenosis. The LAD wraps around the left ventricular apex.  Left circumflex (LCx): The left circumflex gives off a very large obtuse marginal branch. The midportion of the OM has a 90% stenosis. Further down in the OM there is a sequential 95% stenosis. The LM divides into multiple vessels distally.  Right coronary artery (RCA): Normal caliber, dominant vessel. There is heavy mid vessel calcification. There is eccentric 50-70% stenosis in the midportion. The distal vessel is patent. The PDA and PLA branches are small in caliber without significant disease.  Left ventriculography: Deferred  PCI Note: Following the diagnostic procedure, the decision was made to proceed with PCI. The radial sheath was upsized to a 6 [place]. Weight-based bivalirudin was given for anticoagulation. He was loaded with brilinta 180 mg. Once a therapeutic ACT was achieved, a 6 [place] XB 4 cm guide catheter was inserted. A cougar coronary guidewire was used to cross the lesion. The lesion was predilated with a 3.0 x 12 mm balloon. Guide and wire position were lost after balloon predilatation. A new guide catheter was inserted. The lesion was recrossed with the same cougar wire. The distal lesion was then stented with a 4.0 x 12 mm Promus  Premier drug-eluting stent. The more proximal lesion was also stented with a 4.0 x 12 mm Promus Premier DES. Both lesions were postdilated with 4.5 cm noncompliant balloon to 16 atmospheres. Following PCI, there was 0% residual stenosis and TIMI-3 flow at both lesion sites. Final angiography confirmed an excellent result. The patient tolerated the procedure well. There were no immediate procedural complications. A TR band was used for radial hemostasis. The patient was transferred to the post catheterization recovery area for further monitoring.  PCI Data:  Lesion 1  Vessel - OM/Segment - mid  Percent Stenosis (pre) 95  TIMI-flow 3  Stent 4.0x12 mm Promus DES  Percent Stenosis (post) 0  TIMI-flow (post) 3  Lesion 2  Vessel - OM/Segment - prox  Percent Stenosis (pre) 90  TIMI-flow 3  Stent 4.0x12 mm Promus DES  Percent Stenosis (post) 0  TIMI-flow (post) 3  Final Conclusions:  1. Severe single-vessel coronary artery disease with successful PCI of the left circumflex  2. Moderate LAD and RCA stenoses  Recommendations:  Dual antiplatelet therapy with aspirin and brilinta for at least 12 months. Aggressive medical therapy for the patient's residual coronary artery disease. We'll check a 2-D echocardiogram to assess left ventricular function.  Tonny Bollman  04/15/2013, 2:40 PM  Assessment and Plan: 1. Coronary artery disease, native vessel. The patient is stable without symptoms of angina. His medical program was reviewed and he will continue his current medications without changes. I will see him back in 6 months. He was applauded for his efforts at diet and weight loss.  2. Hyperlipidemia. Continue atorvastatin, diet, and exercise.  For followup I will see him back in 6 months. We discussed extension of dual antiplatelet therapy beyond 12 months and we'll review this again when he returns for followup.  Tonny BollmanMichael Delois Tolbert 09/24/2013 9:01 AM

## 2013-09-25 ENCOUNTER — Encounter (HOSPITAL_COMMUNITY)
Admission: RE | Admit: 2013-09-25 | Discharge: 2013-09-25 | Disposition: A | Discharge status: Home / Self Care | Payer: Self-pay | Source: Ambulatory Visit | Attending: Cardiovascular Disease | Admitting: Cardiovascular Disease

## 2013-09-27 ENCOUNTER — Encounter (HOSPITAL_COMMUNITY)
Admission: RE | Admit: 2013-09-27 | Discharge: 2013-09-27 | Disposition: A | Discharge status: Home / Self Care | Payer: Self-pay | Source: Ambulatory Visit | Attending: Cardiovascular Disease | Admitting: Cardiovascular Disease

## 2013-09-27 ENCOUNTER — Encounter: Payer: Self-pay | Admitting: Cardiovascular Disease

## 2013-09-30 ENCOUNTER — Encounter (HOSPITAL_COMMUNITY)
Admission: RE | Admit: 2013-09-30 | Discharge: 2013-09-30 | Disposition: A | Discharge status: Home / Self Care | Payer: Self-pay | Source: Ambulatory Visit | Attending: Cardiovascular Disease | Admitting: Cardiovascular Disease

## 2013-10-02 ENCOUNTER — Encounter: Payer: Self-pay | Admitting: Cardiovascular Disease

## 2013-10-02 ENCOUNTER — Encounter (HOSPITAL_COMMUNITY)
Admission: RE | Admit: 2013-10-02 | Discharge: 2013-10-02 | Disposition: A | Discharge status: Home / Self Care | Payer: Self-pay | Source: Ambulatory Visit | Attending: Cardiovascular Disease | Admitting: Cardiovascular Disease

## 2013-10-03 ENCOUNTER — Telehealth: Payer: Self-pay | Admitting: Cardiovascular Disease

## 2013-10-03 DIAGNOSIS — E875 Hyperkalemia: Secondary | ICD-10-CM

## 2013-10-03 NOTE — Telephone Encounter (Signed)
New message     Talk to Lauren regarding this pt

## 2013-10-03 NOTE — Telephone Encounter (Signed)
I spoke with Wyatt Mason and the pt's potassium on recheck was 4.8 (10/02/13). Initial potassium on 09/27/13 was 5.6. The pt was notified on 09/30/13 to hold his potassium due to hyperkalemia. The research department would like Dr Excell Seltzer to make further recommendations in regards to potassium.

## 2013-10-04 ENCOUNTER — Encounter (HOSPITAL_COMMUNITY)
Admission: RE | Admit: 2013-10-04 | Discharge: 2013-10-04 | Disposition: A | Discharge status: Home / Self Care | Payer: Self-pay | Source: Ambulatory Visit | Attending: Cardiovascular Disease | Admitting: Cardiovascular Disease

## 2013-10-04 NOTE — Telephone Encounter (Signed)
Per Dr Excell Seltzerooper he would recommend that the pt continue to hold potassium and recheck BMP in one week.

## 2013-10-04 NOTE — Telephone Encounter (Signed)
I spoke with the pt's wife and made her aware that the pt needs to continue to hold potassium and will recheck BMP on 10/09/13.

## 2013-10-07 ENCOUNTER — Encounter (HOSPITAL_COMMUNITY)
Admission: RE | Admit: 2013-10-07 | Discharge: 2013-10-07 | Disposition: A | Discharge status: Home / Self Care | Payer: Self-pay | Source: Ambulatory Visit | Attending: Cardiovascular Disease | Admitting: Cardiovascular Disease

## 2013-10-09 ENCOUNTER — Encounter (HOSPITAL_COMMUNITY)
Admission: RE | Admit: 2013-10-09 | Discharge: 2013-10-09 | Disposition: A | Discharge status: Home / Self Care | Payer: Self-pay | Source: Ambulatory Visit | Attending: Cardiovascular Disease | Admitting: Cardiovascular Disease

## 2013-10-09 ENCOUNTER — Other Ambulatory Visit (INDEPENDENT_AMBULATORY_CARE_PROVIDER_SITE_OTHER): Payer: Medicare Other

## 2013-10-09 DIAGNOSIS — E782 Mixed hyperlipidemia: Secondary | ICD-10-CM

## 2013-10-09 LAB — CK: Total CK: 55 U/L (ref 7–232)

## 2013-10-09 LAB — TSH: TSH: 0.49 u[IU]/mL (ref 0.35–4.50)

## 2013-10-11 ENCOUNTER — Encounter (HOSPITAL_COMMUNITY)
Admission: RE | Admit: 2013-10-11 | Discharge: 2013-10-11 | Disposition: A | Discharge status: Home / Self Care | Payer: Self-pay | Source: Ambulatory Visit | Attending: Cardiovascular Disease | Admitting: Cardiovascular Disease

## 2013-10-14 ENCOUNTER — Encounter (HOSPITAL_COMMUNITY)
Admission: RE | Admit: 2013-10-14 | Discharge: 2013-10-14 | Disposition: A | Discharge status: Home / Self Care | Payer: Self-pay | Source: Ambulatory Visit | Attending: Cardiovascular Disease | Admitting: Cardiovascular Disease

## 2013-10-16 ENCOUNTER — Encounter (HOSPITAL_COMMUNITY)
Admission: RE | Admit: 2013-10-16 | Discharge: 2013-10-16 | Disposition: A | Discharge status: Home / Self Care | Payer: Self-pay | Source: Ambulatory Visit | Attending: Cardiovascular Disease | Admitting: Cardiovascular Disease

## 2013-10-18 ENCOUNTER — Encounter (HOSPITAL_COMMUNITY)
Admission: RE | Admit: 2013-10-18 | Discharge: 2013-10-18 | Disposition: A | Discharge status: Home / Self Care | Payer: Self-pay | Source: Ambulatory Visit | Attending: Cardiovascular Disease | Admitting: Cardiovascular Disease

## 2013-10-21 ENCOUNTER — Other Ambulatory Visit: Payer: Self-pay | Admitting: *Deleted

## 2013-10-21 ENCOUNTER — Encounter (HOSPITAL_COMMUNITY)
Admission: RE | Admit: 2013-10-21 | Discharge: 2013-10-21 | Disposition: A | Discharge status: Home / Self Care | Payer: Self-pay | Source: Ambulatory Visit | Attending: Cardiovascular Disease | Admitting: Cardiovascular Disease

## 2013-10-21 MED ORDER — TICAGRELOR 90 MG PO TABS: 90.0000 mg | ORAL_TABLET | Freq: Two times a day (BID) | ORAL | Status: DC

## 2013-10-23 ENCOUNTER — Encounter (HOSPITAL_COMMUNITY)
Admission: RE | Admit: 2013-10-23 | Discharge: 2013-10-23 | Disposition: A | Discharge status: Home / Self Care | Payer: Self-pay | Source: Ambulatory Visit | Attending: Cardiovascular Disease | Admitting: Cardiovascular Disease

## 2013-10-23 DIAGNOSIS — I252 Old myocardial infarction: Secondary | ICD-10-CM | POA: Insufficient documentation

## 2013-10-23 DIAGNOSIS — E785 Hyperlipidemia, unspecified: Secondary | ICD-10-CM | POA: Insufficient documentation

## 2013-10-23 DIAGNOSIS — I10 Essential (primary) hypertension: Secondary | ICD-10-CM | POA: Insufficient documentation

## 2013-10-23 DIAGNOSIS — I2510 Atherosclerotic heart disease of native coronary artery without angina pectoris: Secondary | ICD-10-CM | POA: Insufficient documentation

## 2013-10-23 DIAGNOSIS — Z5189 Encounter for other specified aftercare: Secondary | ICD-10-CM | POA: Insufficient documentation

## 2013-10-28 ENCOUNTER — Encounter (HOSPITAL_COMMUNITY)
Admission: RE | Admit: 2013-10-28 | Discharge: 2013-10-28 | Disposition: A | Discharge status: Home / Self Care | Payer: Self-pay | Source: Ambulatory Visit | Attending: Cardiovascular Disease | Admitting: Cardiovascular Disease

## 2013-10-30 ENCOUNTER — Encounter (HOSPITAL_COMMUNITY)
Admission: RE | Admit: 2013-10-30 | Discharge: 2013-10-30 | Disposition: A | Discharge status: Home / Self Care | Payer: Self-pay | Source: Ambulatory Visit | Attending: Cardiovascular Disease | Admitting: Cardiovascular Disease

## 2013-11-01 ENCOUNTER — Encounter (HOSPITAL_COMMUNITY)
Admission: RE | Admit: 2013-11-01 | Discharge: 2013-11-01 | Disposition: A | Discharge status: Home / Self Care | Payer: Self-pay | Source: Ambulatory Visit | Attending: Cardiovascular Disease | Admitting: Cardiovascular Disease

## 2013-11-02 ENCOUNTER — Other Ambulatory Visit: Payer: Self-pay | Admitting: Internal Medicine

## 2013-11-04 ENCOUNTER — Encounter (HOSPITAL_COMMUNITY)
Admission: RE | Admit: 2013-11-04 | Discharge: 2013-11-04 | Disposition: A | Discharge status: Home / Self Care | Payer: Self-pay | Source: Ambulatory Visit | Attending: Cardiovascular Disease | Admitting: Cardiovascular Disease

## 2013-11-06 ENCOUNTER — Encounter (HOSPITAL_COMMUNITY)
Admission: RE | Admit: 2013-11-06 | Discharge: 2013-11-06 | Disposition: A | Discharge status: Home / Self Care | Payer: Self-pay | Source: Ambulatory Visit | Attending: Cardiovascular Disease | Admitting: Cardiovascular Disease

## 2013-11-08 ENCOUNTER — Encounter (HOSPITAL_COMMUNITY)
Admission: RE | Admit: 2013-11-08 | Discharge: 2013-11-08 | Disposition: A | Discharge status: Home / Self Care | Payer: Self-pay | Source: Ambulatory Visit | Attending: Cardiovascular Disease | Admitting: Cardiovascular Disease

## 2013-11-11 ENCOUNTER — Encounter (HOSPITAL_COMMUNITY)
Admission: RE | Admit: 2013-11-11 | Discharge: 2013-11-11 | Disposition: A | Discharge status: Home / Self Care | Payer: Self-pay | Source: Ambulatory Visit | Attending: Cardiovascular Disease | Admitting: Cardiovascular Disease

## 2013-11-13 ENCOUNTER — Encounter (HOSPITAL_COMMUNITY)
Admission: RE | Admit: 2013-11-13 | Discharge: 2013-11-13 | Disposition: A | Discharge status: Home / Self Care | Payer: Self-pay | Source: Ambulatory Visit | Attending: Cardiovascular Disease | Admitting: Cardiovascular Disease

## 2013-11-15 ENCOUNTER — Encounter (HOSPITAL_COMMUNITY)
Admission: RE | Admit: 2013-11-15 | Discharge: 2013-11-15 | Disposition: A | Discharge status: Home / Self Care | Payer: Self-pay | Source: Ambulatory Visit | Attending: Cardiovascular Disease | Admitting: Cardiovascular Disease

## 2013-11-18 ENCOUNTER — Encounter (HOSPITAL_COMMUNITY)
Admission: RE | Admit: 2013-11-18 | Discharge: 2013-11-18 | Disposition: A | Discharge status: Home / Self Care | Payer: Self-pay | Source: Ambulatory Visit | Attending: Cardiovascular Disease | Admitting: Cardiovascular Disease

## 2013-11-20 ENCOUNTER — Encounter (HOSPITAL_COMMUNITY)
Admission: RE | Admit: 2013-11-20 | Discharge: 2013-11-20 | Disposition: A | Discharge status: Home / Self Care | Payer: Self-pay | Source: Ambulatory Visit | Attending: Cardiovascular Disease | Admitting: Cardiovascular Disease

## 2013-11-22 ENCOUNTER — Encounter (HOSPITAL_COMMUNITY)
Admission: RE | Admit: 2013-11-22 | Discharge: 2013-11-22 | Disposition: A | Discharge status: Home / Self Care | Payer: Self-pay | Source: Ambulatory Visit | Attending: Cardiovascular Disease | Admitting: Cardiovascular Disease

## 2013-11-25 ENCOUNTER — Encounter (HOSPITAL_COMMUNITY)
Admission: RE | Admit: 2013-11-25 | Discharge: 2013-11-25 | Disposition: A | Discharge status: Home / Self Care | Payer: Self-pay | Source: Ambulatory Visit | Attending: Cardiovascular Disease | Admitting: Cardiovascular Disease

## 2013-11-25 DIAGNOSIS — Z5189 Encounter for other specified aftercare: Secondary | ICD-10-CM | POA: Insufficient documentation

## 2013-11-25 DIAGNOSIS — E785 Hyperlipidemia, unspecified: Secondary | ICD-10-CM | POA: Insufficient documentation

## 2013-11-25 DIAGNOSIS — I10 Essential (primary) hypertension: Secondary | ICD-10-CM | POA: Insufficient documentation

## 2013-11-25 DIAGNOSIS — I2510 Atherosclerotic heart disease of native coronary artery without angina pectoris: Secondary | ICD-10-CM | POA: Insufficient documentation

## 2013-11-25 DIAGNOSIS — I252 Old myocardial infarction: Secondary | ICD-10-CM | POA: Insufficient documentation

## 2013-11-27 ENCOUNTER — Encounter (HOSPITAL_COMMUNITY)
Admission: RE | Admit: 2013-11-27 | Discharge: 2013-11-27 | Disposition: A | Discharge status: Home / Self Care | Payer: Self-pay | Source: Ambulatory Visit | Attending: Cardiovascular Disease | Admitting: Cardiovascular Disease

## 2013-11-29 ENCOUNTER — Encounter (HOSPITAL_COMMUNITY)
Admission: RE | Admit: 2013-11-29 | Discharge: 2013-11-29 | Disposition: A | Discharge status: Home / Self Care | Payer: Self-pay | Source: Ambulatory Visit | Attending: Cardiovascular Disease | Admitting: Cardiovascular Disease

## 2013-12-02 ENCOUNTER — Encounter (HOSPITAL_COMMUNITY)
Admission: RE | Admit: 2013-12-02 | Discharge: 2013-12-02 | Disposition: A | Discharge status: Home / Self Care | Payer: Self-pay | Source: Ambulatory Visit | Attending: Cardiovascular Disease | Admitting: Cardiovascular Disease

## 2013-12-04 ENCOUNTER — Encounter (HOSPITAL_COMMUNITY)
Admission: RE | Admit: 2013-12-04 | Discharge: 2013-12-04 | Disposition: A | Discharge status: Home / Self Care | Payer: Self-pay | Source: Ambulatory Visit | Attending: Cardiovascular Disease | Admitting: Cardiovascular Disease

## 2013-12-06 ENCOUNTER — Encounter (HOSPITAL_COMMUNITY)
Admission: RE | Admit: 2013-12-06 | Discharge: 2013-12-06 | Disposition: A | Discharge status: Home / Self Care | Payer: Self-pay | Source: Ambulatory Visit | Attending: Cardiovascular Disease | Admitting: Cardiovascular Disease

## 2013-12-09 ENCOUNTER — Encounter (HOSPITAL_COMMUNITY)
Admission: RE | Admit: 2013-12-09 | Discharge: 2013-12-09 | Disposition: A | Discharge status: Home / Self Care | Payer: Self-pay | Source: Ambulatory Visit | Attending: Cardiovascular Disease | Admitting: Cardiovascular Disease

## 2013-12-11 ENCOUNTER — Encounter (HOSPITAL_COMMUNITY)
Admission: RE | Admit: 2013-12-11 | Discharge: 2013-12-11 | Disposition: A | Discharge status: Home / Self Care | Payer: Self-pay | Source: Ambulatory Visit | Attending: Cardiovascular Disease | Admitting: Cardiovascular Disease

## 2013-12-13 ENCOUNTER — Encounter (HOSPITAL_COMMUNITY)
Admission: RE | Admit: 2013-12-13 | Discharge: 2013-12-13 | Disposition: A | Discharge status: Home / Self Care | Payer: Self-pay | Source: Ambulatory Visit | Attending: Cardiovascular Disease | Admitting: Cardiovascular Disease

## 2013-12-16 ENCOUNTER — Encounter (HOSPITAL_COMMUNITY)
Admission: RE | Admit: 2013-12-16 | Discharge: 2013-12-16 | Disposition: A | Discharge status: Home / Self Care | Payer: Self-pay | Source: Ambulatory Visit | Attending: Cardiovascular Disease | Admitting: Cardiovascular Disease

## 2013-12-18 ENCOUNTER — Encounter (HOSPITAL_COMMUNITY)
Admission: RE | Admit: 2013-12-18 | Discharge: 2013-12-18 | Disposition: A | Discharge status: Home / Self Care | Payer: Self-pay | Source: Ambulatory Visit | Attending: Cardiovascular Disease | Admitting: Cardiovascular Disease

## 2013-12-20 ENCOUNTER — Encounter (HOSPITAL_COMMUNITY)
Admission: RE | Admit: 2013-12-20 | Discharge: 2013-12-20 | Disposition: A | Discharge status: Home / Self Care | Payer: Self-pay | Source: Ambulatory Visit | Attending: Cardiovascular Disease | Admitting: Cardiovascular Disease

## 2013-12-23 ENCOUNTER — Encounter (HOSPITAL_COMMUNITY)
Admission: RE | Admit: 2013-12-23 | Discharge: 2013-12-23 | Disposition: A | Discharge status: Home / Self Care | Payer: Self-pay | Source: Ambulatory Visit | Attending: Cardiovascular Disease | Admitting: Cardiovascular Disease

## 2013-12-25 ENCOUNTER — Encounter (HOSPITAL_COMMUNITY)
Admission: RE | Admit: 2013-12-25 | Discharge: 2013-12-25 | Disposition: A | Discharge status: Home / Self Care | Payer: Self-pay | Source: Ambulatory Visit | Attending: Cardiovascular Disease | Admitting: Cardiovascular Disease

## 2013-12-25 DIAGNOSIS — E785 Hyperlipidemia, unspecified: Secondary | ICD-10-CM | POA: Insufficient documentation

## 2013-12-25 DIAGNOSIS — I252 Old myocardial infarction: Secondary | ICD-10-CM | POA: Insufficient documentation

## 2013-12-25 DIAGNOSIS — I2510 Atherosclerotic heart disease of native coronary artery without angina pectoris: Secondary | ICD-10-CM | POA: Insufficient documentation

## 2013-12-25 DIAGNOSIS — I10 Essential (primary) hypertension: Secondary | ICD-10-CM | POA: Insufficient documentation

## 2013-12-25 DIAGNOSIS — Z5189 Encounter for other specified aftercare: Secondary | ICD-10-CM | POA: Insufficient documentation

## 2013-12-27 ENCOUNTER — Encounter (HOSPITAL_COMMUNITY)
Admission: RE | Admit: 2013-12-27 | Discharge: 2013-12-27 | Disposition: A | Discharge status: Home / Self Care | Payer: Self-pay | Source: Ambulatory Visit | Attending: Cardiovascular Disease | Admitting: Cardiovascular Disease

## 2014-01-01 ENCOUNTER — Encounter (HOSPITAL_COMMUNITY)
Admission: RE | Admit: 2014-01-01 | Discharge: 2014-01-01 | Disposition: A | Discharge status: Home / Self Care | Payer: Self-pay | Source: Ambulatory Visit | Attending: Cardiovascular Disease | Admitting: Cardiovascular Disease

## 2014-01-03 ENCOUNTER — Encounter (HOSPITAL_COMMUNITY)
Admission: RE | Admit: 2014-01-03 | Discharge: 2014-01-03 | Disposition: A | Discharge status: Home / Self Care | Payer: Self-pay | Source: Ambulatory Visit | Attending: Cardiovascular Disease | Admitting: Cardiovascular Disease

## 2014-01-06 ENCOUNTER — Encounter (HOSPITAL_COMMUNITY)
Admission: RE | Admit: 2014-01-06 | Discharge: 2014-01-06 | Disposition: A | Discharge status: Home / Self Care | Payer: Self-pay | Source: Ambulatory Visit | Attending: Cardiovascular Disease | Admitting: Cardiovascular Disease

## 2014-01-08 ENCOUNTER — Encounter (HOSPITAL_COMMUNITY)
Admission: RE | Admit: 2014-01-08 | Discharge: 2014-01-08 | Disposition: A | Discharge status: Home / Self Care | Payer: Self-pay | Source: Ambulatory Visit | Attending: Cardiovascular Disease | Admitting: Cardiovascular Disease

## 2014-01-10 ENCOUNTER — Encounter (HOSPITAL_COMMUNITY)
Admission: RE | Admit: 2014-01-10 | Discharge: 2014-01-10 | Disposition: A | Discharge status: Home / Self Care | Payer: Self-pay | Source: Ambulatory Visit | Attending: Cardiovascular Disease | Admitting: Cardiovascular Disease

## 2014-01-13 ENCOUNTER — Encounter (HOSPITAL_COMMUNITY)
Admission: RE | Admit: 2014-01-13 | Discharge: 2014-01-13 | Disposition: A | Discharge status: Home / Self Care | Payer: Self-pay | Source: Ambulatory Visit | Attending: Cardiovascular Disease | Admitting: Cardiovascular Disease

## 2014-01-15 ENCOUNTER — Encounter (HOSPITAL_COMMUNITY)
Admission: RE | Admit: 2014-01-15 | Discharge: 2014-01-15 | Disposition: A | Discharge status: Home / Self Care | Payer: Self-pay | Source: Ambulatory Visit | Attending: Cardiovascular Disease | Admitting: Cardiovascular Disease

## 2014-01-17 ENCOUNTER — Encounter (HOSPITAL_COMMUNITY)
Admission: RE | Admit: 2014-01-17 | Discharge: 2014-01-17 | Disposition: A | Discharge status: Home / Self Care | Payer: Self-pay | Source: Ambulatory Visit | Attending: Cardiovascular Disease | Admitting: Cardiovascular Disease

## 2014-01-20 ENCOUNTER — Encounter (HOSPITAL_COMMUNITY)
Admission: RE | Admit: 2014-01-20 | Discharge: 2014-01-20 | Disposition: A | Discharge status: Home / Self Care | Payer: Self-pay | Source: Ambulatory Visit | Attending: Cardiovascular Disease | Admitting: Cardiovascular Disease

## 2014-01-22 ENCOUNTER — Encounter (HOSPITAL_COMMUNITY)
Admission: RE | Admit: 2014-01-22 | Discharge: 2014-01-22 | Disposition: A | Discharge status: Home / Self Care | Payer: Self-pay | Source: Ambulatory Visit | Attending: Cardiovascular Disease | Admitting: Cardiovascular Disease

## 2014-01-24 ENCOUNTER — Encounter (HOSPITAL_COMMUNITY): Payer: Self-pay

## 2014-01-27 ENCOUNTER — Encounter (HOSPITAL_COMMUNITY): Payer: Self-pay

## 2014-01-29 ENCOUNTER — Encounter (HOSPITAL_COMMUNITY): Payer: Self-pay

## 2014-01-31 ENCOUNTER — Other Ambulatory Visit: Payer: Self-pay | Admitting: Internal Medicine

## 2014-01-31 ENCOUNTER — Encounter (HOSPITAL_COMMUNITY): Payer: Self-pay

## 2014-02-03 ENCOUNTER — Encounter (HOSPITAL_COMMUNITY): Payer: Self-pay

## 2014-02-05 ENCOUNTER — Encounter (HOSPITAL_COMMUNITY): Payer: Self-pay

## 2014-02-07 ENCOUNTER — Encounter (HOSPITAL_COMMUNITY): Payer: Self-pay

## 2014-02-10 ENCOUNTER — Encounter (HOSPITAL_COMMUNITY): Payer: Self-pay

## 2014-02-12 ENCOUNTER — Encounter: Payer: Self-pay | Admitting: Cardiovascular Disease

## 2014-02-12 ENCOUNTER — Encounter (HOSPITAL_COMMUNITY): Payer: Self-pay

## 2014-02-14 ENCOUNTER — Encounter (HOSPITAL_COMMUNITY): Payer: Self-pay

## 2014-02-17 ENCOUNTER — Encounter (HOSPITAL_COMMUNITY): Payer: Self-pay

## 2014-02-19 ENCOUNTER — Encounter (HOSPITAL_COMMUNITY): Payer: Self-pay

## 2014-02-21 ENCOUNTER — Encounter (HOSPITAL_COMMUNITY): Payer: Self-pay

## 2014-03-06 ENCOUNTER — Telehealth: Payer: Self-pay

## 2014-03-06 NOTE — Telephone Encounter (Signed)
LMOVM to call and schedule medicare exam for 2015.

## 2014-03-23 NOTE — Progress Notes (Signed)
Background: The patient is followed for coronary artery disease and initially presented with a non-ST elevation infarction in December 2014. He was found to have moderate stenosis in the LAD and right coronary artery and critical stenosis in the left circumflex distribution. He was treated with drug-eluting stents in the obtuse marginal branches of the left circumflex. Medical therapy was recommended for his residual disease. He's been treated with aspirin and brilinta.  HPI:  80 year-old man presenting for follow-up evaluation. He was last seen in June 2015.  The patient is doing well. He volunteers at cardiac rehabilitation and tries to exercise at least 3 days per week. He has no symptoms with physical exertion. He specifically denies chest pain, chest pressure, shortness of breath, edema, or heart palpitations. He's had no leg swelling, orthopnea, or PND. He is compliant with his medications. He denies any bleeding-related problems on dual antiplatelet therapy with aspirin and brilinta.  Studies:  Lipid Panel     Component Value Date/Time   CHOL 122 05/21/2013 0937   TRIG 75.0 05/21/2013 0937   HDL 50.10 05/21/2013 0937   CHOLHDL 2 05/21/2013 0937   VLDL 15.0 05/21/2013 0937   LDLCALC 57 05/21/2013 0937   2D Echo 04/16/2014: Study Conclusions  - Left ventricle: The cavity size was normal. Wall thickness was increased in a pattern of mild LVH. Systolic function was normal. The estimated ejection fraction was in the range of 55% to 60%. Regional wall motion abnormalities cannot be excluded. Doppler parameters are consistent with abnormal left ventricular relaxation (grade 1 diastolic dysfunction). - Left atrium: The atrium was mildly dilated. - Right atrium: The atrium was mildly dilated.  Cardiac Cath 04/15/2013: Final Conclusions:  1. Severe single-vessel coronary artery disease with successful PCI of the left circumflex 2. Moderate LAD and RCA stenoses    Recommendations:  Dual antiplatelet therapy with aspirin and brilinta for at least 12 months. Aggressive medical therapy for the patient's residual coronary artery disease. We'll check a 2-D echocardiogram to assess left ventricular function.  Outpatient Encounter Prescriptions as of 03/24/2014  Medication Sig  . aspirin EC 81 MG tablet Take 81 mg by mouth daily.  Marland Kitchen. atorvastatin (LIPITOR) 40 MG tablet Take 1 tablet (40 mg total) by mouth daily at 6 PM.  . Cholecalciferol (VITAMIN D-3) 1000 UNITS CAPS Take by mouth daily.  . fluorouracil (EFUDEX) 5 % cream   . metoprolol tartrate (LOPRESSOR) 25 MG tablet take 1 tablet by mouth twice a day  . Multiple Vitamin (MULTIVITAMIN WITH MINERALS) TABS tablet Take 1 tablet by mouth daily.  . nitroGLYCERIN (NITROSTAT) 0.4 MG SL tablet Place 1 tablet (0.4 mg total) under the tongue every 5 (five) minutes x 3 doses as needed for chest pain.  . ticagrelor (BRILINTA) 90 MG TABS tablet Take 1 tablet (90 mg total) by mouth 2 (two) times daily.  . [DISCONTINUED] potassium chloride SA (K-DUR,KLOR-CON) 20 MEQ tablet Take 10 mEq by mouth daily.    No Known Allergies  Past Medical History  Diagnosis Date  . Hypertension   . Fibromyalgia   . Shingles   . Obesity   . Hyperlipidemia   . CAD (coronary artery disease) 04/15/2013    a. s/p DES x 2 to mid OM, residual RCA and LAD borderline CAD    family history includes Heart attack in his father. There is no history of CVA.   ROS: Negative except as per HPI  BP 130/74 mmHg  Pulse 63  Ht 6\' 1"  (1.854  m)  Wt 248 lb 6.4 oz (112.674 kg)  BMI 32.78 kg/m2  SpO2 95%  PHYSICAL EXAM: Pt is alert and oriented, pleasant elderly gentleman in NAD HEENT: normal Neck: JVP - normal, carotids 2+= without bruits Lungs: CTA bilaterally CV: RRR without murmur or gallop Abd: soft, NT, Positive BS, no hepatomegaly Ext: no C/C/E, distal pulses intact and equal Skin: warm/dry no rash  EKG:  Normal sinus rhythm 63  bpm, first-degree AV block, otherwise within normal limits.  ASSESSMENT AND PLAN: 1. Coronary artery disease, native vessel, without symptoms of angina. The patient will be continued on his current medical program. We discussed the pros and cons of dual antiplatelet therapy after 12 months. Considering the fact that he had moderate residual CAD, was treated with multiple DES platforms, and presented with ACS, I would favor at least another 12 months of DAPT since he is tolerating it well.  2. Hyperlipidemia. Lipids reviewed as above. He continues on atorvastatin 40 mg. He will have lab work checked with his annual physical exam with Dr. Alwyn RenHopper.  Her follow-up I would like to see him back in 12 months. He will call if any problems arise in the interim.  Tonny BollmanMichael Woodrow Drab, MD 03/24/2014 8:27 AM

## 2014-03-24 ENCOUNTER — Ambulatory Visit (INDEPENDENT_AMBULATORY_CARE_PROVIDER_SITE_OTHER): Payer: Medicare Other | Admitting: Cardiovascular Disease

## 2014-03-24 ENCOUNTER — Encounter: Payer: Self-pay | Admitting: Cardiovascular Disease

## 2014-03-24 VITALS — BP 130/74 | HR 63 | Ht 73.0 in | Wt 248.4 lb

## 2014-03-24 DIAGNOSIS — E782 Mixed hyperlipidemia: Secondary | ICD-10-CM

## 2014-03-24 DIAGNOSIS — I10 Essential (primary) hypertension: Secondary | ICD-10-CM

## 2014-03-24 DIAGNOSIS — I2510 Atherosclerotic heart disease of native coronary artery without angina pectoris: Secondary | ICD-10-CM

## 2014-03-24 NOTE — Patient Instructions (Signed)
Your physician wants you to follow-up in: 1 YEAR with Dr Cooper.  You will receive a reminder letter in the mail two months in advance. If you don't receive a letter, please call our office to schedule the follow-up appointment.  Your physician recommends that you continue on your current medications as directed. Please refer to the Current Medication list given to you today.  

## 2014-04-03 ENCOUNTER — Encounter (HOSPITAL_COMMUNITY): Payer: Self-pay | Admitting: Cardiovascular Disease

## 2014-04-08 ENCOUNTER — Encounter: Payer: Self-pay | Admitting: Internal Medicine

## 2014-04-08 ENCOUNTER — Ambulatory Visit (INDEPENDENT_AMBULATORY_CARE_PROVIDER_SITE_OTHER): Payer: Medicare Other | Admitting: Internal Medicine

## 2014-04-08 VITALS — BP 128/68 | HR 71 | Temp 98.1°F | Wt 245.1 lb

## 2014-04-08 DIAGNOSIS — J209 Acute bronchitis, unspecified: Secondary | ICD-10-CM

## 2014-04-08 DIAGNOSIS — J309 Allergic rhinitis, unspecified: Secondary | ICD-10-CM

## 2014-04-08 MED ORDER — HYDROCODONE-HOMATROPINE 5-1.5 MG/5ML PO SYRP: 5.0000 mL | ORAL_SOLUTION | Freq: Four times a day (QID) | ORAL | Status: DC | PRN

## 2014-04-08 MED ORDER — AMOXICILLIN 500 MG PO CAPS: 500.0000 mg | ORAL_CAPSULE | Freq: Three times a day (TID) | ORAL | Status: DC

## 2014-04-08 NOTE — Patient Instructions (Signed)

## 2014-04-08 NOTE — Progress Notes (Signed)
Pre visit review using our clinic review tool, if applicable. No additional management support is needed unless otherwise documented below in the visit note. 

## 2014-04-08 NOTE — Progress Notes (Signed)
   Subjective:    Patient ID: Wyatt Mason, male    DOB: 24-Aug-1932, 80 y.o.   MRN: [id number]  HPI   Symptoms began 911/15 as sneezing, rhinitis, and frontal and facial sinus pain  As of this morning he's had paroxysmal coughing lasting up to 30 seconds productive of scant greenish material . All nasal secretions are clear.  He has associated itchy, watery eyes.  He has never smoked. He has history of asthma    Review of Systems   He denies fever, chills, or sweats  He has no shortness of breath or wheezing.  He's had a scratchy throat which is minor. He has no dental pain.  He has no otic pain or otic discharge.     Objective:   Physical Exam   He has some hyponasal speech pattern. There is significant edema with clear secretions of the nares.  General appearance:good health ;well nourished; no acute distress or increased work of breathing is present.  No  lymphadenopathy about the head, neck, or axilla noted.   Eyes: No conjunctival inflammation or lid edema is present. There is no scleral icterus.  Ears:  External ear exam shows no significant lesions or deformities.  Otoscopic examination reveals clear canals, tympanic membranes are intact bilaterally without bulging, retraction, inflammation or discharge.  Nose:  External nasal examination shows no deformity or inflammation. Nasal mucosa without l exudates. No septal dislocation or deviation.Some obstruction to airflow.   Oral exam: Dental hygiene is good; lips and gums are healthy appearing.There is no oropharyngeal erythema or exudate noted.   Neck:  No deformities, thyromegaly, masses, or tenderness noted.   Supple with full range of motion without pain.   Heart:  Normal rate and regular rhythm. S1 and S2 normal without gallop, murmur, click, rub or other extra sounds.   Lungs:Chest clear to auscultation; no wheezes, rhonchi,rales ,or rubs present.No increased work of breathing.    Extremities:  No  cyanosis, edema, or clubbing  noted    Skin: Warm & dry w/o jaundice or tenting.           Assessment & Plan:  #1 acute bronchitis w/o bronchospasm #2 rhinitis Plan: See orders and recommendations

## 2014-04-12 ENCOUNTER — Other Ambulatory Visit: Payer: Self-pay | Admitting: Cardiovascular Disease

## 2014-04-29 ENCOUNTER — Ambulatory Visit (INDEPENDENT_AMBULATORY_CARE_PROVIDER_SITE_OTHER): Payer: Medicare Other | Admitting: Internal Medicine

## 2014-04-29 ENCOUNTER — Encounter: Payer: Self-pay | Admitting: Internal Medicine

## 2014-04-29 ENCOUNTER — Other Ambulatory Visit (INDEPENDENT_AMBULATORY_CARE_PROVIDER_SITE_OTHER): Payer: Medicare Other

## 2014-04-29 VITALS — BP 134/70 | HR 75 | Temp 98.1°F | Ht 73.0 in | Wt 247.0 lb

## 2014-04-29 DIAGNOSIS — E782 Mixed hyperlipidemia: Secondary | ICD-10-CM

## 2014-04-29 DIAGNOSIS — I10 Essential (primary) hypertension: Secondary | ICD-10-CM | POA: Diagnosis not present

## 2014-04-29 DIAGNOSIS — R739 Hyperglycemia, unspecified: Secondary | ICD-10-CM | POA: Diagnosis not present

## 2014-04-29 DIAGNOSIS — Z0000 Encounter for general adult medical examination without abnormal findings: Secondary | ICD-10-CM | POA: Diagnosis not present

## 2014-04-29 LAB — CK: Total CK: 53 U/L (ref 7–232)

## 2014-04-29 LAB — LIPID PANEL
Cholesterol: 183 mg/dL (ref 0–200)
HDL: 66 mg/dL (ref >39.00)
LDL CALC: 99 mg/dL (ref 0–99)
NonHDL: 117
TRIGLYCERIDES: 90 mg/dL (ref 0.0–149.0)
Total CHOL/HDL Ratio: 3
VLDL: 18 mg/dL (ref 0.0–40.0)

## 2014-04-29 LAB — BASIC METABOLIC PANEL
BUN: 15 mg/dL (ref 6–23)
CALCIUM: 8.8 mg/dL (ref 8.4–10.5)
CO2: 28 mEq/L (ref 19–32)
Chloride: 103 mEq/L (ref 96–112)
Creatinine, Ser: 0.7 mg/dL (ref 0.4–1.5)
GFR: 111.75 mL/min (ref >60.00)
Glucose, Bld: 111 mg/dL — ABNORMAL HIGH (ref 70–99)
POTASSIUM: 4.3 meq/L (ref 3.5–5.1)
Sodium: 140 mEq/L (ref 135–145)

## 2014-04-29 LAB — HEMOGLOBIN A1C: Hgb A1c MFr Bld: 6.1 % (ref 4.6–6.5)

## 2014-04-29 LAB — HEPATIC FUNCTION PANEL
ALBUMIN: 4.2 g/dL (ref 3.5–5.2)
ALK PHOS: 60 U/L (ref 39–117)
ALT: 40 U/L (ref 0–53)
AST: 29 U/L (ref 0–37)
BILIRUBIN DIRECT: 0.2 mg/dL (ref 0.0–0.3)
TOTAL PROTEIN: 7.2 g/dL (ref 6.0–8.3)
Total Bilirubin: 1.5 mg/dL — ABNORMAL HIGH (ref 0.2–1.2)

## 2014-04-29 LAB — TSH: TSH: 1.71 u[IU]/mL (ref 0.35–4.50)

## 2014-04-29 NOTE — Patient Instructions (Signed)
Your next office appointment will be determined based upon review of your pending labs . Those instructions will be transmitted to you  by mail. 

## 2014-04-29 NOTE — Assessment & Plan Note (Signed)
Blood pressure goals reviewed. BMET 

## 2014-04-29 NOTE — Progress Notes (Signed)
Pre visit review using our clinic review tool, if applicable. No additional management support is needed unless otherwise documented below in the visit note. 

## 2014-04-29 NOTE — Progress Notes (Signed)
Subjective:    Patient ID: Wyatt Mason, male    DOB: 01/14/1933, 81 y.o.   MRN: [id number]  HPI UHC/Medicare Wellness Visit: Psychosocial and medical history were reviewed as required by Medicare (history related to abuse, antisocial behavior , firearm risk). Social history: Caffeine: 2-3 cups/ day Alcohol:  2-4 oz qod Tobacco NWG:NFAOZuse:never Exercise:3 X / week on treadmill,bike, stepper 30 min w/o CP symptoms ([facility]) Personal safety/fall risk:no Limitations of activities of daily living:no Seatbelt/ smoke alarm use:yes Healthcare Power of Attorney/Living Will status and End of Life process assessment :  UTD Ophthalmologic exam status:UTD Hearing evaluation status:not UTD Orientation: Oriented X 3 Memory and recall: good Math testing: good Depression/anxiety assessment: no Foreign travel history:2005 [place] Immunization status for influenza/pneumonia/ shingles /tetanus:Shingles needed Transfusion history:no Preventive health care maintenance status: Colonoscopy as per protocol/standard care:not UTD; "I'm not having one; too many people have problems" (SOC reviewed) Dental care:every 6 mos  Chart reviewed and updated. Active issues reviewed and addressed as documented below.   He is compliant with his statin and beta blocker without adverse effects. BP 120s/60s-130s/70s. Labs not current.  Review of Systems  Significant headaches, epistaxis, chest pain, palpitations, exertional dyspnea, claudication, paroxysmal nocturnal dyspnea, or edema absent. No GI symptoms , memory loss or myalgias  Unexplained weight loss, abdominal pain, significant dyspepsia, dysphagia, melena, rectal bleeding, or persistently small caliber stools are denied.     Objective:   Physical Exam  Gen.: Healthy and well-nourished in appearance. Alert, appropriate and cooperative throughout exam. Appears younger than stated age  Head: Normocephalic without obvious abnormalities;   patternalopecia  Eyes: No corneal or conjunctival inflammation noted. Pupils equal round reactive to light and accommodation. Extraocular motion intact.  Ears: External  ear exam reveals no significant lesions or deformities. Canals clear .TMs normal. Hearing is grossly normal bilaterally. Nose: External nasal exam reveals no deformity or inflammation. Nasal mucosa are pink and moist. No lesions or exudates noted.   Mouth: Oral mucosa and oropharynx reveal no lesions or exudates. Teeth in good repair. Neck: No deformities, masses, or tenderness noted. Range of motion slightly decreased. Thyroid normal. Lungs: Normal respiratory effort; chest expands symmetrically. Lungs are clear to auscultation without rales, wheezes, or increased work of breathing. Heart: Normal rate and rhythm. Normal S1 and S2. No gallop, click, or rub. no murmur. Abdomen: Bowel sounds normal; abdomen soft and nontender. No masses, or organomegaly. Ventral hernia noted. Genitalia: Genitalia normal except for left varices & granuloma. Prostate is normal (Note: PMH of BPH) without enlargement, asymmetry, nodularity, or induration                   Musculoskeletal/extremities: No deformity or scoliosis noted of  the thoracic or lumbar spine.  No clubbing, cyanosis, edema, or significant extremity  deformity noted.  Range of motion normal . Tone & strength normal. Hand joints normal.  Fingernail  health good. Crepitus of knees  Able to lie down & sit up w/o help.  Negative SLR bilaterally Vascular: Carotid, radial artery, dorsalis pedis and  posterior tibial pulses are full and equal. No bruits present. Neurologic: Alert and oriented x3. Deep tendon reflexes symmetrical and normal.  Gait normal Skin: Intact without suspicious lesions or rashes. Lymph: No cervical, axillary, or inguinal lymphadenopathy present. Psych: Mood and affect are normal. Normally interactive  Assessment & Plan:  See Current Assessment & Plan in Problem List under specific DiagnosisThe labs will be reviewed and risks and options assessed. Written recommendations will be provided by mail or directly through My Chart.Further evaluation or change in medical therapy will be directed by those results.

## 2014-04-29 NOTE — Assessment & Plan Note (Signed)
A1c

## 2014-04-29 NOTE — Assessment & Plan Note (Signed)
Lipids, LFTs, TSH ,CK 

## 2014-04-29 NOTE — Assessment & Plan Note (Signed)
DRE now normal

## 2014-05-04 ENCOUNTER — Other Ambulatory Visit: Payer: Self-pay | Admitting: Internal Medicine

## 2014-05-05 ENCOUNTER — Telehealth: Payer: Self-pay

## 2014-05-05 MED ORDER — ROSUVASTATIN CALCIUM 40 MG PO TABS
40.0000 mg | ORAL_TABLET | Freq: Every day | ORAL | Status: DC
Start: 2014-05-05 — End: 2014-07-31

## 2014-05-05 NOTE — Telephone Encounter (Signed)
-----   Message from Pecola LawlessWilliam F Hopper, MD sent at 05/03/2014  4:12 PM EST ----- After present supply of cholesterol medication completed Dr Excell Seltzerooper & I recommend Crestor 40 mg daily

## 2014-05-05 NOTE — Telephone Encounter (Signed)
Left message with patient's wife to have him call.

## 2014-05-05 NOTE — Telephone Encounter (Signed)
Patient has been advised and [facility] [address]

## 2014-05-14 ENCOUNTER — Other Ambulatory Visit: Payer: Self-pay | Admitting: Cardiovascular Disease

## 2014-06-16 ENCOUNTER — Other Ambulatory Visit: Payer: Self-pay

## 2014-06-16 MED ORDER — NITROGLYCERIN 0.4 MG SL SUBL: 0.4000 mg | SUBLINGUAL_TABLET | SUBLINGUAL | Status: DC | PRN

## 2014-07-28 ENCOUNTER — Other Ambulatory Visit: Payer: Self-pay | Admitting: Internal Medicine

## 2014-07-31 ENCOUNTER — Other Ambulatory Visit: Payer: Self-pay

## 2014-07-31 MED ORDER — ROSUVASTATIN CALCIUM 40 MG PO TABS: 40.0000 mg | ORAL_TABLET | Freq: Every day | ORAL | Status: DC

## 2014-08-25 DIAGNOSIS — H2513 Age-related nuclear cataract, bilateral: Secondary | ICD-10-CM | POA: Diagnosis not present

## 2014-08-25 DIAGNOSIS — H5203 Hypermetropia, bilateral: Secondary | ICD-10-CM | POA: Diagnosis not present

## 2014-09-08 ENCOUNTER — Encounter: Payer: Self-pay | Admitting: Internal Medicine

## 2014-09-08 ENCOUNTER — Ambulatory Visit (INDEPENDENT_AMBULATORY_CARE_PROVIDER_SITE_OTHER): Payer: Medicare Other | Admitting: Internal Medicine

## 2014-09-08 VITALS — BP 108/82 | HR 66 | Temp 98.0°F | Wt 248.0 lb

## 2014-09-08 DIAGNOSIS — J3089 Other allergic rhinitis: Secondary | ICD-10-CM | POA: Diagnosis not present

## 2014-09-08 NOTE — Patient Instructions (Signed)

## 2014-09-08 NOTE — Progress Notes (Signed)
Pre visit review using our clinic review tool, if applicable. No additional management support is needed unless otherwise documented below in the visit note. 

## 2014-09-08 NOTE — Progress Notes (Signed)
   Subjective:    Patient ID: Wyatt Mason, male    DOB: 05-26-1932, 81 y.o.   MRN: [id number]  HPI He's had intermittent symptoms since January which he describes as a "head cold". It had been recurring intermittently but only now is associated with cough. He describes copious rhinitis which is clear. He also has sneezing. The sputum is clear. He does have frontal sinus pressure. He's been using Tylenol Cold and sinus.  He has no other upper respiratory tract or extrinsic symptoms.   Review of Systems Facial pain , nasal purulence, dental pain, sore throat , otic pain or otic discharge denied. No fever , chills or sweats. There is no  sputum production, wheezing,or  paroxysmal nocturnal dyspnea.    Objective:   Physical Exam  General appearance:Adequately nourished; no acute distress or increased work of breathing is present.    Lymphatic: No  lymphadenopathy about the head, neck, or axilla .  Eyes: No conjunctival inflammation or lid edema is present. There is no scleral icterus.  Ears:  External ear exam shows no significant lesions or deformities.  Otoscopic examination reveals clear canals, tympanic membranes are dull but  intact bilaterally without bulging, retraction, inflammation or discharge.  Nose:  External nasal examination shows no deformity or inflammation. Nasal mucosa are dry without lesions or exudates No septal dislocation or deviation.No obstruction to airflow.   Oral exam: Dental hygiene is good; lips and gums are healthy appearing.There is no oropharyngeal erythema or exudate .  Neck:  No deformities, thyromegaly, masses, or tenderness noted.   Supple with full range of motion without pain.   Heart:  Normal rate and regular rhythm. S1 and S2 normal without gallop, murmur, click,or rub. S4.   Lungs:Chest clear to auscultation; no wheezes, rhonchi,rales ,or rubs present.  Extremities:  No cyanosis, edema, or clubbing  noted    Skin: Warm & dry w/o tenting  or jaundice. No significant lesions or rash.       Assessment & Plan:  #1 allergic rhinitis See orders & AVS

## 2014-09-15 ENCOUNTER — Encounter: Payer: Self-pay | Admitting: Internal Medicine

## 2014-09-15 ENCOUNTER — Ambulatory Visit (INDEPENDENT_AMBULATORY_CARE_PROVIDER_SITE_OTHER): Payer: Medicare Other | Admitting: Internal Medicine

## 2014-09-15 VITALS — BP 122/60 | HR 65 | Temp 97.7°F | Resp 16 | Wt 252.0 lb

## 2014-09-15 DIAGNOSIS — J0110 Acute frontal sinusitis, unspecified: Secondary | ICD-10-CM | POA: Diagnosis not present

## 2014-09-15 MED ORDER — AMOXICILLIN-POT CLAVULANATE 875-125 MG PO TABS: 1.0000 | ORAL_TABLET | Freq: Two times a day (BID) | ORAL | Status: DC

## 2014-09-15 NOTE — Progress Notes (Signed)
Pre visit review using our clinic review tool, if applicable. No additional management support is needed unless otherwise documented below in the visit note. 

## 2014-09-15 NOTE — Patient Instructions (Addendum)
Plain Mucinex (NOT D) for thick secretions ;force NON dairy fluids .   Nasal cleansing in the shower as discussed with lather of mild shampoo.After 10 seconds wash off lather while  exhaling through nostrils. Make sure that all residual soap is removed to prevent irritation.  Flonase OR Nasacort AQ 1 spray in each nostril twice a day as needed. Use the "crossover" technique into opposite nostril spraying toward opposite ear @ 45 degree angle, not straight up into nostril.  Plain Allegra (NOT D )  160 daily , Loratidine 10 mg , OR Zyrtec 10 mg @ bedtime  as needed for itchy eyes & sneezing.  Take the Augmentin every 12 hours after a large meal. Breo one inhalation daily; gargle and spit after use. Lot #:[id number] Exp 01/18

## 2014-09-15 NOTE — Progress Notes (Signed)
   Subjective:    Patient ID: Wyatt Mason, male    DOB: 1933-03-03, 81 y.o.   MRN: [id number]  HPI  Previously he had extrinsic symptoms but now he has congestion in his head and chest. He has used nasal hygiene, Allegra, and nasal spray but symptoms have progressed. He now describes frontal and facial pain with yellow/green nasal discharge. He has a lesser amount of green sputum. He has noted some wheezing.    Review of Systems He has no fever, chills, or sweats.  He has no extrinsic symptoms of itchy, watery eyes, sneezing. No otic pain or discharge. There is no shortness of breath associated with the cough.    Objective:   Physical Exam  General appearance:Adequately nourished; no acute distress or increased work of breathing is present.    Lymphatic: No  lymphadenopathy about the head, neck, or axilla .  Eyes: No conjunctival inflammation or lid edema is present. There is no scleral icterus.  Ears:  External ear exam shows no significant lesions or deformities.  Otoscopic examination reveals clear canals, tympanic membranes are intact bilaterally without bulging, retraction, inflammation or discharge.  Nose:  External nasal examination shows no deformity or inflammation. Nasal mucosa are slightly boggy without lesions or exudates No septal dislocation or deviation.No obstruction to airflow.   Oral exam: Dental hygiene is good; lips and gums are healthy appearing.There is no oropharyngeal erythema or exudate .  Neck:  No deformities, thyromegaly, masses, or tenderness noted.   Supple with full range of motion without pain.   Heart:  Normal rate and regular rhythm. S1 and S2 normal without gallop, murmur, click, rub or other extra sounds.   Lungs: Distant low-grade isolated wheezing without increased work of breathing.   Extremities:  No cyanosis, edema, or clubbing  noted    Skin: Warm & dry w/o tenting or jaundice. No significant lesions or rash.       Assessment  & Plan:  #1 frontal and maxillary sinusitis  #2 bronchitis with bronchospasm  Plan: See orders recommendations

## 2014-09-19 DIAGNOSIS — L820 Inflamed seborrheic keratosis: Secondary | ICD-10-CM | POA: Diagnosis not present

## 2014-10-03 ENCOUNTER — Ambulatory Visit (INDEPENDENT_AMBULATORY_CARE_PROVIDER_SITE_OTHER): Payer: Medicare Other | Admitting: Internal Medicine

## 2014-10-03 ENCOUNTER — Encounter: Payer: Self-pay | Admitting: Internal Medicine

## 2014-10-03 VITALS — BP 132/64 | HR 65 | Temp 98.0°F | Resp 14 | Ht 73.0 in | Wt 254.0 lb

## 2014-10-03 DIAGNOSIS — J309 Allergic rhinitis, unspecified: Secondary | ICD-10-CM | POA: Insufficient documentation

## 2014-10-03 DIAGNOSIS — J3089 Other allergic rhinitis: Secondary | ICD-10-CM

## 2014-10-03 MED ORDER — BENZONATATE 100 MG PO CAPS: 100.0000 mg | ORAL_CAPSULE | Freq: Two times a day (BID) | ORAL | Status: DC | PRN

## 2014-10-03 NOTE — Assessment & Plan Note (Signed)
He is taking nasacort and encouraged him to continue. Also talked to him about sinus rinses. Rx for tessalon perles to help with his cough which is likely from the drainage. Explained to him that the average cough continues about 3 weeks long and his is about average at this time. Lungs clear and no need for chest x-ray or antibiotics at this time.

## 2014-10-03 NOTE — Patient Instructions (Signed)
We do not think that you need any antibiotics today.   We have sent in tessalon perles for your cough that help to work in the brain on how much you are coughing.   You can also think about doing sinus rinses or irrigation with a neti poti or bottle using salt water. This helps to rinse out the sinuses and clears them better.

## 2014-10-03 NOTE — Progress Notes (Signed)
   Subjective:    Patient ID: Wyatt Mason, male    DOB: 31-Jan-1933, 81 y.o.   MRN: [id number]  HPI The patient is an 81 YO man coming in for nasal congestion and cough. He has sinus infection several weeks ago and treated with augmentin. Initially he states that he did not improve at all on it but then clarifies that it cleared his lower lungs but did not affect his nose much at all. Initially he denies using any other medication for it but then upon questioning is taking nasacort daily. Has noticed minimal benefit. No fevers or chills. Cough is mostly non-productive. Some mild ear and facial pain.   Review of Systems  Constitutional: Negative for fever, chills, activity change and fatigue.  HENT: Positive for congestion, ear pain, rhinorrhea and sinus pressure. Negative for ear discharge, postnasal drip, sore throat and trouble swallowing.   Eyes: Negative.   Respiratory: Positive for cough. Negative for chest tightness, shortness of breath and wheezing.   Cardiovascular: Negative.   Gastrointestinal: Negative.       Objective:   Physical Exam  Constitutional: He appears well-developed and well-nourished.  HENT:  Head: Normocephalic and atraumatic.  Right Ear: External ear normal.  Left Ear: External ear normal.  Oropharynx with mild clear drainage, nasal turbinates with mild swelling and no crusting. No tenderness to touch of the sinuses.   Eyes: EOM are normal.  Neck: Neck supple. No thyromegaly present.  Cardiovascular: Normal rate and regular rhythm.   Pulmonary/Chest: Effort normal and breath sounds normal. No respiratory distress. He has no wheezes.  Abdominal: Soft.  Lymphadenopathy:    He has no cervical adenopathy.   Filed Vitals:   10/03/14 1534  BP: 132/64  Pulse: 65  Temp: 98 F (36.7 C)  TempSrc: Oral  Resp: 14  Height: 6\' 1"  (1.854 m)  Weight: 254 lb (115.214 kg)  SpO2: 94%      Assessment & Plan:

## 2014-10-03 NOTE — Progress Notes (Signed)
Pre visit review using our clinic review tool, if applicable. No additional management support is needed unless otherwise documented below in the visit note. 

## 2014-11-10 ENCOUNTER — Telehealth: Payer: Self-pay | Admitting: Internal Medicine

## 2014-11-10 ENCOUNTER — Other Ambulatory Visit: Payer: Self-pay | Admitting: Emergency Medicine

## 2014-11-10 DIAGNOSIS — E785 Hyperlipidemia, unspecified: Secondary | ICD-10-CM

## 2014-11-10 MED ORDER — ATORVASTATIN CALCIUM 20 MG PO TABS: 20.0000 mg | ORAL_TABLET | Freq: Every day | ORAL | Status: DC

## 2014-11-10 NOTE — Telephone Encounter (Signed)
Patients insurance has gone up on crestor and patient can not afford.  He is requesting 3 month script of atorvastatin to be sent to pharmacy.

## 2014-11-10 NOTE — Telephone Encounter (Signed)
Okay to send in 

## 2014-11-10 NOTE — Telephone Encounter (Signed)
20 mg Atorvastatin #90  Fasting labs after 10 weeks of new Rx:  Lipids, LFTs, TSH

## 2015-01-08 DIAGNOSIS — L84 Corns and callosities: Secondary | ICD-10-CM | POA: Diagnosis not present

## 2015-01-08 DIAGNOSIS — M19072 Primary osteoarthritis, left ankle and foot: Secondary | ICD-10-CM | POA: Diagnosis not present

## 2015-01-11 ENCOUNTER — Encounter (HOSPITAL_COMMUNITY): Payer: Self-pay | Admitting: Emergency Medicine

## 2015-01-11 ENCOUNTER — Emergency Department (HOSPITAL_COMMUNITY): Payer: Medicare Other

## 2015-01-11 ENCOUNTER — Observation Stay (HOSPITAL_COMMUNITY)
Admission: EM | Admit: 2015-01-11 | Discharge: 2015-01-12 | Disposition: A | Discharge status: Home / Self Care | Payer: Medicare Other | Attending: Cardiology | Admitting: Cardiology

## 2015-01-11 DIAGNOSIS — I2510 Atherosclerotic heart disease of native coronary artery without angina pectoris: Secondary | ICD-10-CM | POA: Diagnosis present

## 2015-01-11 DIAGNOSIS — R079 Chest pain, unspecified: Secondary | ICD-10-CM | POA: Diagnosis not present

## 2015-01-11 DIAGNOSIS — Z7982 Long term (current) use of aspirin: Secondary | ICD-10-CM | POA: Diagnosis not present

## 2015-01-11 DIAGNOSIS — Z23 Encounter for immunization: Secondary | ICD-10-CM | POA: Insufficient documentation

## 2015-01-11 DIAGNOSIS — E785 Hyperlipidemia, unspecified: Secondary | ICD-10-CM | POA: Diagnosis not present

## 2015-01-11 DIAGNOSIS — I25110 Atherosclerotic heart disease of native coronary artery with unstable angina pectoris: Secondary | ICD-10-CM | POA: Diagnosis not present

## 2015-01-11 DIAGNOSIS — I10 Essential (primary) hypertension: Secondary | ICD-10-CM | POA: Diagnosis not present

## 2015-01-11 DIAGNOSIS — Z96641 Presence of right artificial hip joint: Secondary | ICD-10-CM | POA: Diagnosis not present

## 2015-01-11 DIAGNOSIS — E782 Mixed hyperlipidemia: Secondary | ICD-10-CM | POA: Diagnosis not present

## 2015-01-11 DIAGNOSIS — I252 Old myocardial infarction: Secondary | ICD-10-CM | POA: Insufficient documentation

## 2015-01-11 DIAGNOSIS — R0789 Other chest pain: Secondary | ICD-10-CM | POA: Diagnosis not present

## 2015-01-11 LAB — COMPREHENSIVE METABOLIC PANEL
ALT: 24 U/L (ref 17–63)
AST: 26 U/L (ref 15–41)
Albumin: 3.8 g/dL (ref 3.5–5.0)
Alkaline Phosphatase: 55 U/L (ref 38–126)
Anion gap: 10 (ref 5–15)
BILIRUBIN TOTAL: 1.2 mg/dL (ref 0.3–1.2)
BUN: 13 mg/dL (ref 6–20)
CHLORIDE: 99 mmol/L — AB (ref 101–111)
CO2: 27 mmol/L (ref 22–32)
Calcium: 8.5 mg/dL — ABNORMAL LOW (ref 8.9–10.3)
Creatinine, Ser: 0.84 mg/dL (ref 0.61–1.24)
Glucose, Bld: 149 mg/dL — ABNORMAL HIGH (ref 65–99)
POTASSIUM: 3.7 mmol/L (ref 3.5–5.1)
Sodium: 136 mmol/L (ref 135–145)
TOTAL PROTEIN: 6.2 g/dL — AB (ref 6.5–8.1)

## 2015-01-11 LAB — CBC
HCT: 37.9 % — ABNORMAL LOW (ref 39.0–52.0)
Hemoglobin: 12.9 g/dL — ABNORMAL LOW (ref 13.0–17.0)
MCH: 30.6 pg (ref 26.0–34.0)
MCHC: 34 g/dL (ref 30.0–36.0)
MCV: 90 fL (ref 78.0–100.0)
PLATELETS: 169 10*3/uL (ref 150–400)
RBC: 4.21 MIL/uL — AB (ref 4.22–5.81)
RDW: 13.3 % (ref 11.5–15.5)
WBC: 5.8 10*3/uL (ref 4.0–10.5)

## 2015-01-11 LAB — BASIC METABOLIC PANEL
ANION GAP: 7 (ref 5–15)
BUN: 15 mg/dL (ref 6–20)
CALCIUM: 8.9 mg/dL (ref 8.9–10.3)
CO2: 28 mmol/L (ref 22–32)
Chloride: 105 mmol/L (ref 101–111)
Creatinine, Ser: 0.86 mg/dL (ref 0.61–1.24)
Glucose, Bld: 116 mg/dL — ABNORMAL HIGH (ref 65–99)
Potassium: 4 mmol/L (ref 3.5–5.1)
Sodium: 140 mmol/L (ref 135–145)

## 2015-01-11 LAB — TSH: TSH: 3.035 u[IU]/mL (ref 0.350–4.500)

## 2015-01-11 LAB — TROPONIN I

## 2015-01-11 LAB — CBC WITH DIFFERENTIAL/PLATELET
BASOS PCT: 1 %
Basophils Absolute: 0 10*3/uL (ref 0.0–0.1)
EOS ABS: 0.2 10*3/uL (ref 0.0–0.7)
EOS PCT: 3 %
HCT: 36.9 % — ABNORMAL LOW (ref 39.0–52.0)
HEMOGLOBIN: 12.9 g/dL — AB (ref 13.0–17.0)
Lymphocytes Relative: 30 %
Lymphs Abs: 1.8 10*3/uL (ref 0.7–4.0)
MCH: 31.7 pg (ref 26.0–34.0)
MCHC: 35 g/dL (ref 30.0–36.0)
MCV: 90.7 fL (ref 78.0–100.0)
MONOS PCT: 6 %
Monocytes Absolute: 0.4 10*3/uL (ref 0.1–1.0)
NEUTROS PCT: 60 %
Neutro Abs: 3.6 10*3/uL (ref 1.7–7.7)
PLATELETS: 156 10*3/uL (ref 150–400)
RBC: 4.07 MIL/uL — AB (ref 4.22–5.81)
RDW: 13.3 % (ref 11.5–15.5)
WBC: 6 10*3/uL (ref 4.0–10.5)

## 2015-01-11 LAB — APTT: aPTT: 26 seconds (ref 24–37)

## 2015-01-11 LAB — PROTIME-INR
INR: 1.11 (ref 0.00–1.49)
PROTHROMBIN TIME: 14.5 s (ref 11.6–15.2)

## 2015-01-11 LAB — BRAIN NATRIURETIC PEPTIDE: B Natriuretic Peptide: 105.6 pg/mL — ABNORMAL HIGH (ref 0.0–100.0)

## 2015-01-11 MED ORDER — CHLORPHENIRAMINE-ACETAMINOPHEN 2-325 MG PO TABS: 1.0000 | ORAL_TABLET | Freq: Every day | ORAL | Status: DC | PRN

## 2015-01-11 MED ORDER — ONDANSETRON HCL 4 MG/2ML IJ SOLN: 4.0000 mg | Freq: Four times a day (QID) | INTRAMUSCULAR | Status: DC | PRN

## 2015-01-11 MED ORDER — SODIUM CHLORIDE 0.9 % IV SOLN: 250.0000 mL | INTRAVENOUS | Status: DC | PRN

## 2015-01-11 MED ORDER — HEPARIN BOLUS VIA INFUSION
4000.0000 [IU] | Freq: Once | INTRAVENOUS | Status: AC
  Administered 2015-01-11: 4000 [IU] via INTRAVENOUS
  Filled 2015-01-11: qty 4000

## 2015-01-11 MED ORDER — SODIUM CHLORIDE 0.9 % IJ SOLN: 3.0000 mL | Freq: Two times a day (BID) | INTRAMUSCULAR | Status: DC

## 2015-01-11 MED ORDER — ASPIRIN EC 81 MG PO TBEC
81.0000 mg | DELAYED_RELEASE_TABLET | Freq: Every day | ORAL | Status: DC
  Administered 2015-01-12: 81 mg via ORAL
  Filled 2015-01-11: qty 1

## 2015-01-11 MED ORDER — ACETAMINOPHEN 325 MG PO TABS: 650.0000 mg | ORAL_TABLET | ORAL | Status: DC | PRN

## 2015-01-11 MED ORDER — FLUOROURACIL 5 % EX CREA: TOPICAL_CREAM | Freq: Every day | CUTANEOUS | Status: DC

## 2015-01-11 MED ORDER — METOPROLOL TARTRATE 25 MG PO TABS
25.0000 mg | ORAL_TABLET | Freq: Two times a day (BID) | ORAL | Status: DC
  Administered 2015-01-11 – 2015-01-12 (×2): 25 mg via ORAL
  Filled 2015-01-11 (×2): qty 1

## 2015-01-11 MED ORDER — TICAGRELOR 90 MG PO TABS
90.0000 mg | ORAL_TABLET | Freq: Two times a day (BID) | ORAL | Status: DC
  Administered 2015-01-11 – 2015-01-12 (×2): 90 mg via ORAL
  Filled 2015-01-11 (×2): qty 1

## 2015-01-11 MED ORDER — ATORVASTATIN CALCIUM 80 MG PO TABS
80.0000 mg | ORAL_TABLET | Freq: Every day | ORAL | Status: DC
  Administered 2015-01-11: 80 mg via ORAL
  Filled 2015-01-11: qty 1

## 2015-01-11 MED ORDER — ONDANSETRON HCL 4 MG/2ML IJ SOLN: 4.0000 mg | Freq: Three times a day (TID) | INTRAMUSCULAR | Status: DC | PRN

## 2015-01-11 MED ORDER — ADULT MULTIVITAMIN W/MINERALS CH
1.0000 | ORAL_TABLET | Freq: Every day | ORAL | Status: DC
  Administered 2015-01-12: 1 via ORAL
  Filled 2015-01-11: qty 1

## 2015-01-11 MED ORDER — HEPARIN (PORCINE) IN NACL 100-0.45 UNIT/ML-% IJ SOLN
1450.0000 [IU]/h | INTRAMUSCULAR | Status: DC
  Administered 2015-01-11 – 2015-01-12 (×2): 1450 [IU]/h via INTRAVENOUS
  Filled 2015-01-11 (×2): qty 250

## 2015-01-11 MED ORDER — SODIUM CHLORIDE 0.9 % IJ SOLN
3.0000 mL | INTRAMUSCULAR | Status: DC | PRN
Start: 2015-01-11 — End: 2015-01-12

## 2015-01-11 MED ORDER — ASPIRIN 81 MG PO CHEW
243.0000 mg | CHEWABLE_TABLET | Freq: Once | ORAL | Status: AC
  Administered 2015-01-11: 243 mg via ORAL
  Filled 2015-01-11: qty 3

## 2015-01-11 MED ORDER — NITROGLYCERIN 0.4 MG SL SUBL: 0.4000 mg | SUBLINGUAL_TABLET | SUBLINGUAL | Status: DC | PRN

## 2015-01-11 NOTE — ED Provider Notes (Signed)
Medical screening examination/treatment/procedure(s) were conducted as a shared visit with non-physician practitioner(s) and myself.  I personally evaluated the patient during the encounter.  81 year old malewith a history of ACS status post stent couple years ago presents to the emergency department with left-sided chest pressure exactly same as a previous in semi-. No significant associated symptoms. Was relieved after proximate 7-8 minutes with nitroglycerin. Has been symptom-free since then. On exam patient without any abnormalities in his cardiac, pulmonary or abdominal exams. Vital signs are stable. Initial troponin is negative EKG without acute ischemia. Patient's high risk for ACS especially with a high risk story. Patient will need to be admitted for surgical troponin and EKGs and other workup.   EKG Interpretation   Date/Time:  Sunday January 11 2015 11:45:57 EDT Ventricular Rate:  81 PR Interval:  196 QRS Duration: 96 QT Interval:  384 QTC Calculation: 446 R Axis:   23 Text Interpretation:  Normal sinus rhythm Normal ECG similar to 04/14/2013  ECG Confirmed by [facility] MD, Barbara Cower [phone]) on 01/11/2015 11:53:44 AM        Marily Memos, MD 01/11/15 1314

## 2015-01-11 NOTE — ED Provider Notes (Signed)
CSN: [id number]     Arrival date & time 01/11/15  1137 History   First MD Initiated Contact with Patient 01/11/15 1212     Chief Complaint  Patient presents with  . Chest Pain   (Consider location/radiation/quality/duration/timing/severity/associated sxs/prior Treatment) HPI   Patient is a 81 year old male with history of NSTEMI in 2013 with DES to left circ, EF 60%, hypertension, hyperlipidemia, obesity, he presented to the emergency department today with sudden onset left sided chest pain, that began at rest at approximately 1120 this morning, was described as a pressure, lasted for approximately 8-10 minutes, and was relieved with nitroglycerin. He states that the location of his chest pain is "all over" and is without radiation. He states that his chest pain today felt exactly like when he had a heart attack in 2014.  The pt was driving to church and had just arrived in the church parking lot and parked his car when the CP began.  He was able to get out of the car without worsening CP.  His wife reports that he appeared to be in significant amount of pain, but did not appear pale, sweaty, and he did not have any rapid breathing, LOC or facial asymmetry.  Patient denies any other associated symptoms other than some excessive sweating today with the heat. He denies shortness of breath, syncope, palpitations, lower extremity edema, orthopnea, PND, nausea, vomiting, diarrhea, fatigue, cough, fever, chills.  He has not had any chest pain episodes since his heart attack in 2014. He is a patient of Dr. Excell Seltzer. He did take a baby aspirin this morning and is compliant with his metoprolol and Brilinta. He has not other complaints at this time, and upon arrival to the ER is chest pain free.  Past Medical History  Diagnosis Date  . Hypertension   . Shingles 2005  . Obesity   . Hyperlipidemia   . CAD (coronary artery disease) 04/15/2013    a. s/p DES x 2 to mid OM, residual RCA and LAD borderline CAD    Past Surgical History  Procedure Laterality Date  . Total hip arthroplasty Right 2005  . Appendectomy    . Coronary angioplasty with stent placement  04/15/2013    sequential 90% then 95% mid OM lesions s/p DES x 2, residual 50% prox LAD and 50-70% mid RCA stenoses; LV gram deferred  . Left heart catheterization with coronary angiogram N/A 04/15/2013    Procedure: LEFT HEART CATHETERIZATION WITH CORONARY ANGIOGRAM;  Surgeon: Micheline Chapman, MD;  Location: [facility] CATH LAB;  Service: Cardiovascular;  Laterality: N/A;  . No colonoscopy      SOC reviewed 04/29/14   Family History  Problem Relation Age of Onset  . Heart attack Father     onset in 30s; died   . CVA Neg Hx   . Cancer Neg Hx   . Diabetes Neg Hx   . Stroke Neg Hx    Social History  Substance Use Topics  . Smoking status: Never Smoker   . Smokeless tobacco: None  . Alcohol Use: 1.2 oz/week    2 Shots of liquor per week    Review of Systems  Constitutional: Negative.  Negative for fever, chills, diaphoresis and fatigue.  HENT: Negative.   Eyes: Negative.   Respiratory: Negative for cough, chest tightness, shortness of breath, wheezing and stridor.   Cardiovascular: Negative for palpitations and leg swelling.  Gastrointestinal: Negative.  Negative for nausea, vomiting, abdominal pain, diarrhea and constipation.  Endocrine: Negative.  Genitourinary: Negative.   Musculoskeletal: Negative.   Skin: Negative.  Negative for color change and pallor.  Neurological: Negative for dizziness, tremors, syncope, facial asymmetry, speech difficulty, weakness, light-headedness and headaches.  Psychiatric/Behavioral: Negative.     Allergies  Review of patient's allergies indicates no known allergies.  Home Medications   Prior to Admission medications   Medication Sig Start Date End Date Taking? Authorizing Provider  aspirin EC 81 MG tablet Take 81 mg by mouth daily.   Yes Historical Provider, MD  atorvastatin (LIPITOR) 20  MG tablet Take 1 tablet (20 mg total) by mouth daily. 11/10/14  Yes Pecola Lawless, MD  BRILINTA 90 MG TABS tablet take 1 tablet by mouth twice a day 05/14/14  Yes Tonny Bollman, MD  Chlorpheniramine-APAP (CORICIDIN) 2-325 MG TABS Take 1 tablet by mouth daily as needed (allergy symptoms).   Yes Historical Provider, MD  Cholecalciferol (VITAMIN D-3) 1000 UNITS CAPS Take by mouth daily.   Yes Historical Provider, MD  fluorouracil (EFUDEX) 5 % cream Apply topically daily.  01/30/14  Yes Historical Provider, MD  metoprolol tartrate (LOPRESSOR) 25 MG tablet take 1 tablet by mouth twice a day 07/28/14  Yes Pecola Lawless, MD  Multiple Vitamin (MULTIVITAMIN WITH MINERALS) TABS tablet Take 1 tablet by mouth daily.   Yes Historical Provider, MD  Naproxen Sod-Diphenhydramine (ALEVE PM) 220-25 MG TABS Take 1 tablet by mouth every 5 (five) minutes as needed (chest pain).   Yes Historical Provider, MD  nitroGLYCERIN (NITROSTAT) 0.4 MG SL tablet Place 1 tablet (0.4 mg total) under the tongue every 5 (five) minutes x 3 doses as needed for chest pain. 06/16/14  Yes Pecola Lawless, MD  rosuvastatin (CRESTOR) 40 MG tablet Take 1 tablet (40 mg total) by mouth daily. 07/31/14   Pecola Lawless, MD   BP 138/57 mmHg  Pulse 58  Temp(Src) 98.1 F (36.7 C) (Oral)  Resp 15  Wt 258 lb 9.6 oz (117.3 kg)  SpO2 97% Physical Exam  Constitutional: He is oriented to person, place, and time. He appears well-developed and well-nourished. No distress.  HENT:  Head: Normocephalic and atraumatic.  Nose: Nose normal.  Mouth/Throat: Oropharynx is clear and moist. No oropharyngeal exudate.  Eyes: Conjunctivae and EOM are normal. Pupils are equal, round, and reactive to light. Right eye exhibits no discharge. Left eye exhibits no discharge. No scleral icterus.  Neck: Normal range of motion. No JVD present. No tracheal deviation present. No thyromegaly present.  Cardiovascular: Normal rate, regular rhythm, normal heart sounds and  intact distal pulses.  Exam reveals no gallop and no friction rub.   No murmur heard. Symmetrical pulses radial 2+ dorsal pedis 2+, no pitting edema in bilateral lower extremities  Pulmonary/Chest: Effort normal and breath sounds normal. No respiratory distress. He has no wheezes. He has no rales. He exhibits no tenderness.  Abdominal: Soft. Bowel sounds are normal. He exhibits no distension and no mass. There is no tenderness. There is no rebound and no guarding.  Abdomen soft, nontender nondistended, no abdominal bruits  Musculoskeletal: Normal range of motion. He exhibits no edema or tenderness.  Lymphadenopathy:    He has no cervical adenopathy.  Neurological: He is alert and oriented to person, place, and time. He has normal reflexes. No cranial nerve deficit. He exhibits normal muscle tone. Coordination normal.  Skin: Skin is warm and dry. No rash noted. He is not diaphoretic. No erythema. No pallor.  Psychiatric: He has a normal mood and affect. His behavior  is normal. Judgment and thought content normal.  Nursing note and vitals reviewed.   ED Course  Procedures (including critical care time) Labs Review Labs Reviewed  BASIC METABOLIC PANEL - Abnormal; Notable for the following:    Glucose, Bld 116 (*)    All other components within normal limits  CBC - Abnormal; Notable for the following:    RBC 4.21 (*)    Hemoglobin 12.9 (*)    HCT 37.9 (*)    All other components within normal limits  TROPONIN I    Imaging Review Dg Chest 2 View  01/11/2015   CLINICAL DATA:  Central chest pain since this morning history of hypertension  EXAM: CHEST  2 VIEW  COMPARISON:  09/02/2005  FINDINGS: The heart size and mediastinal contours are within normal limits. Both lungs are clear. The visualized skeletal structures are unremarkable.  IMPRESSION: No active cardiopulmonary disease.   Electronically Signed   By: Esperanza Heir M.D.   On: 01/11/2015 14:02   I have personally reviewed and  evaluated these images and lab results as part of my medical decision-making.   EKG Interpretation   Date/Time:  Sunday January 11 2015 11:45:57 EDT Ventricular Rate:  81 PR Interval:  196 QRS Duration: 96 QT Interval:  384 QTC Calculation: 446 R Axis:   23 Text Interpretation:  Normal sinus rhythm Normal ECG similar to 04/14/2013  ECG Confirmed by [facility] MD, Barbara Cower [phone]) on 01/11/2015 11:53:44 AM      MDM   Final diagnoses:  None   CP, hx of NSTEMI Patient of Dr. Earmon [place]. Given his cardiac history, his age, and history this morning, patient will likely be admitted for chest pain rule out and serial troponins. The patient has 0 chest pain at this time. Will page cardiology for admission.  Cardiology to admit to tele for obs for further work up and tx      Danelle Berry, PA-C 01/11/15 1546  Marily Memos, MD 01/13/15 [phone]

## 2015-01-11 NOTE — Progress Notes (Signed)
ANTICOAGULATION CONSULT NOTE - Initial Consult  Pharmacy Consult for Heparin Indication: chest pain/ACS  No Known Allergies  Patient Measurements: Height:  (185.4 cm) Weight: 253 lb 1.6 oz (114.805 kg) IBW/kg (Calculated) : 79.9 Heparin Dosing Weight: 105 kg  Vital Signs: Temp: 97.9 F (36.6 C) (09/18 1851) Temp Source: Oral (09/18 1851) BP: 168/76 mmHg (09/18 1851) Pulse Rate: 64 (09/18 1851)  Labs:  Recent Labs  01/11/15 1145  HGB 12.9*  HCT 37.9*  PLT 169  CREATININE 0.86  TROPONINI <0.03    Estimated Creatinine Clearance: 91 mL/min (by C-G formula based on Cr of 0.86).   Medical History: Past Medical History  Diagnosis Date  . Hypertension   . Shingles 2005  . Obesity   . Hyperlipidemia   . CAD (coronary artery disease) 04/15/2013    a. s/p DES x 2 to mid OM, residual RCA and LAD borderline CAD   Assessment:   81 yr old male with hx CAD and prior stents admitted with chest pain.  To begin heparin infusion.  For myoview in am.  Goal of Therapy:  Heparin level 0.3-0.7 units/ml Monitor platelets by anticoagulation protocol: Yes   Plan:   Heparin 4000 units IV x 1  Heparin drip to begin at 1450 units/hr  Heparin level ~6 hrs after drip begins.  Daily heparin level and CBC while on heparin.  Dennie Fetters, [place] Pager: [phone] 01/11/2015,7:30 PM

## 2015-01-11 NOTE — ED Notes (Signed)
CP starting 1120. Took nitro x1 that he had on hand. Hx of heart cath with one stent 2014 (Dr Excell Seltzer). CP subsided at this time. NO SOB

## 2015-01-11 NOTE — H&P (Addendum)
History and Physical   Patient ID: Wyatt Mason, MRN: [id number], DOB: 05-Oct-1932   Date of Encounter: 01/11/2015, 2:01 PM  Primary Care Provider: Marga Melnick, MD Cardiologist: Dr. Tonny Bollman  Electrophysiologist:  n/a  Chief Complaint:  Chest pain  History of Present Illness: Wyatt Mason is a 81 y.o. male with a history of CAD status post non-STEMI in 12/14 treated with a DES 2 to the obtuse marginal branch of the LCx. Catheterization at that time demonstrated moderate nonobstructive disease in LAD and RCA which was treated medically. Last seen by Dr. Excell Seltzer 02/2014.  He has not had a stress test since his cath.  He has been in his USOH since then.  Today he was getting ready for [facility] and felt a little sweaty.  While at [facility] he developed tightness across his chest with no radiation similar to his prior angina.  He took a SL NTG and headed to [facility] ER.  By the time he got to the ER the pain had resolved.  He denied any associated SOB, nausea or diaphoresis.  Currently pain free.  Initial EKG with no acute changes and initial trop is normal.  He denies any recent CP or DOE.  He was active walking outside up until May when it got too hot.   Cardiac Studies: LHC 03/2013 LM: Mild calcification LAD: Proximal 50% LCx: Mid OM 90% then sequential 95% RCA: Mid 50-70% PCI: 4 x 12 mm Promus DES to the proximal OM, 4 x 12 mm Promus DES to the mid OM   Echo 12/14  Mild LVH, EF 55-60%, grade 1 diastolic dysfunction, mild LAE, mild RAE, possible mobile plaque in aortic arch   Past Medical History  Diagnosis Date  . Hypertension   . Shingles 2005  . Obesity   . Hyperlipidemia   . CAD (coronary artery disease) 04/15/2013    a. s/p DES x 2 to mid OM, residual RCA and LAD borderline CAD     Past Surgical History  Procedure Laterality Date  . Total hip arthroplasty Right 2005  . Appendectomy    . Coronary angioplasty with stent placement  04/15/2013    sequential 90% then  95% mid OM lesions s/p DES x 2, residual 50% prox LAD and 50-70% mid RCA stenoses; LV gram deferred  . Left heart catheterization with coronary angiogram N/A 04/15/2013    Procedure: LEFT HEART CATHETERIZATION WITH CORONARY ANGIOGRAM;  Surgeon: Micheline Chapman, MD;  Location: [facility] CATH LAB;  Service: Cardiovascular;  Laterality: N/A;  . No colonoscopy      SOC reviewed 04/29/14      Prior to Admission medications   Medication Sig Start Date End Date Taking? Authorizing Provider  aspirin EC 81 MG tablet Take 81 mg by mouth daily.   Yes Historical Provider, MD  atorvastatin (LIPITOR) 20 MG tablet Take 1 tablet (20 mg total) by mouth daily. 11/10/14  Yes Pecola Lawless, MD  BRILINTA 90 MG TABS tablet take 1 tablet by mouth twice a day 05/14/14  Yes Tonny Bollman, MD  Chlorpheniramine-APAP (CORICIDIN) 2-325 MG TABS Take 1 tablet by mouth daily as needed (allergy symptoms).   Yes Historical Provider, MD  Cholecalciferol (VITAMIN D-3) 1000 UNITS CAPS Take by mouth daily.   Yes Historical Provider, MD  fluorouracil (EFUDEX) 5 % cream Apply topically daily.  01/30/14  Yes Historical Provider, MD  metoprolol tartrate (LOPRESSOR) 25 MG tablet take 1 tablet by mouth twice a day 07/28/14  Yes Chrissie Noa  Minerva Ends, MD  Multiple Vitamin (MULTIVITAMIN WITH MINERALS) TABS tablet Take 1 tablet by mouth daily.   Yes Historical Provider, MD  Naproxen Sod-Diphenhydramine (ALEVE PM) 220-25 MG TABS Take 1 tablet by mouth every 5 (five) minutes as needed (chest pain).   Yes Historical Provider, MD  nitroGLYCERIN (NITROSTAT) 0.4 MG SL tablet Place 1 tablet (0.4 mg total) under the tongue every 5 (five) minutes x 3 doses as needed for chest pain. 06/16/14  Yes Pecola Lawless, MD  rosuvastatin (CRESTOR) 40 MG tablet Take 1 tablet (40 mg total) by mouth daily. 07/31/14   Pecola Lawless, MD      Allergies: No Known Allergies   Social History:  The patient  reports that he has never smoked. He does not have any smokeless  tobacco history on file. He reports that he drinks about 1.2 oz of alcohol per week. He reports that he does not use illicit drugs.   Family History:  The patient's family history includes Heart attack in his father. There is no history of CVA, Cancer, Diabetes, or Stroke.   ROS:  Please see the history of present illness.     All other systems reviewed and negative.   Vital Signs: Blood pressure 167/83, pulse 79, temperature 98.1 F (36.7 C), temperature source Oral, resp. rate 18, weight 258 lb 9.6 oz (117.3 kg), SpO2 95 %.  PHYSICAL EXAM: General:  Well nourished, well developed, in no acute distress HEENT: normal Lymph: no adenopathy Neck: no JVD Endocrine:  No thryomegaly Vascular: No carotid bruits; FA pulses 2+ bilaterally without bruits Cardiac:  normal S1, S2; RRR; no murmur Lungs:  clear to auscultation bilaterally, no wheezing, rhonchi or rales Abd: soft, nontender, no hepatomegaly Ext: no edema Musculoskeletal:  No deformities, BUE and BLE strength normal and equal Skin: warm and dry Neuro:  CNs 2-12 intact, no focal abnormalities noted Psych:  Normal affect    EKG:  NSR, HR 81, normal axis, no acute ST changes, QTc 446 ms    Labs:   Lab Results  Component Value Date   WBC 5.8 01/11/2015   HGB 12.9* 01/11/2015   HCT 37.9* 01/11/2015   MCV 90.0 01/11/2015   PLT 169 01/11/2015     Recent Labs Lab 01/11/15 1145  NA 140  K 4.0  CL 105  CO2 28  BUN 15  CREATININE 0.86  CALCIUM 8.9  GLUCOSE 116*     Recent Labs  01/11/15 1145  TROPONINI <0.03    Lab Results  Component Value Date   CHOL 183 04/29/2014   HDL 66.00 04/29/2014   LDLCALC 99 04/29/2014   TRIG 90.0 04/29/2014    No results found for: DDIMER   Radiology/Studies:   Dg Chest 2 View  01/11/2015   CLINICAL DATA:  Central chest pain since this morning history of hypertension  EXAM: CHEST  2 VIEW  COMPARISON:  09/02/2005  FINDINGS: The heart size and mediastinal contours are within  normal limits. Both lungs are clear. The visualized skeletal structures are unremarkable.  IMPRESSION: No active cardiopulmonary disease.   Electronically Signed   By: Esperanza Heir M.D.   On: 01/11/2015 14:02      ASSESSMENT AND PLAN:   1.  CAD:  History of NSTEMI with DES to mid OM with residual nonobstructive disease in the LAD and RCA.  He has been on medical management with no angina.    2.  Chest pain similar to prior pain with NSTEMI.  Initial  Trop is normal and EKG nonischemic.  He his pain free.  Will continue to cycle enzymes.  If they remain negative will make NPO after MN and get Lexiscan myoview in am.  Continue ASA/Brilinta/BB/statin.  His stent was over 2 years ago.  Dr. Excell Seltzer recommended at least 2 years of DAPT.  If stress test negative may consider stopping Brilinta or decreasing dose to  BID.  Will put on IV heparin until rule out complete.  3.  HTN - controlled on BB  4.  Hyperlipidemia:  LDL on last check 04/2014 was 99 and goal < 70.  He had been on crestor  daily which was changed to Liptor  daily due to cost.  Will increase to  daily.  He will need repeat FLP and ALT in 6 weeks.   Luna Glasgow, PA-C 01/11/2015 2:01 PM  Pager # [phone]

## 2015-01-12 ENCOUNTER — Observation Stay (HOSPITAL_COMMUNITY): Payer: Medicare Other

## 2015-01-12 ENCOUNTER — Encounter (HOSPITAL_COMMUNITY): Payer: Self-pay | Admitting: *Deleted

## 2015-01-12 DIAGNOSIS — I209 Angina pectoris, unspecified: Secondary | ICD-10-CM | POA: Diagnosis not present

## 2015-01-12 DIAGNOSIS — E785 Hyperlipidemia, unspecified: Secondary | ICD-10-CM | POA: Diagnosis not present

## 2015-01-12 DIAGNOSIS — I25110 Atherosclerotic heart disease of native coronary artery with unstable angina pectoris: Secondary | ICD-10-CM | POA: Diagnosis not present

## 2015-01-12 DIAGNOSIS — R079 Chest pain, unspecified: Secondary | ICD-10-CM | POA: Diagnosis not present

## 2015-01-12 DIAGNOSIS — I10 Essential (primary) hypertension: Secondary | ICD-10-CM | POA: Diagnosis not present

## 2015-01-12 DIAGNOSIS — I2510 Atherosclerotic heart disease of native coronary artery without angina pectoris: Secondary | ICD-10-CM | POA: Diagnosis not present

## 2015-01-12 LAB — NM MYOCAR MULTI W/SPECT W/WALL MOTION / EF
CHL CUP NUCLEAR SDS: 0
CHL CUP RESTING HR STRESS: 68 {beats}/min
CSEPED: 5 min
CSEPEDS: 18 s
CSEPEW: 1 METS
LV dias vol: 143 mL
LV sys vol: 64 mL
MPHR: 140 {beats}/min
Peak HR: 86 {beats}/min
Percent HR: 61 %
RATE: 0.27
SRS: 7
SSS: 7
TID: 1.2

## 2015-01-12 LAB — LIPID PANEL
CHOL/HDL RATIO: 2.7 ratio
Cholesterol: 174 mg/dL (ref 0–200)
HDL: 65 mg/dL (ref >40)
LDL CALC: 87 mg/dL (ref 0–99)
TRIGLYCERIDES: 108 mg/dL (ref <150)
VLDL: 22 mg/dL (ref 0–40)

## 2015-01-12 LAB — HEPARIN LEVEL (UNFRACTIONATED): HEPARIN UNFRACTIONATED: 0.73 [IU]/mL — AB (ref 0.30–0.70)

## 2015-01-12 LAB — CBC
HEMATOCRIT: 35.4 % — AB (ref 39.0–52.0)
HEMOGLOBIN: 12.4 g/dL — AB (ref 13.0–17.0)
MCH: 31.3 pg (ref 26.0–34.0)
MCHC: 35 g/dL (ref 30.0–36.0)
MCV: 89.4 fL (ref 78.0–100.0)
Platelets: 148 10*3/uL — ABNORMAL LOW (ref 150–400)
RBC: 3.96 MIL/uL — ABNORMAL LOW (ref 4.22–5.81)
RDW: 13.3 % (ref 11.5–15.5)
WBC: 5.5 10*3/uL (ref 4.0–10.5)

## 2015-01-12 LAB — TROPONIN I

## 2015-01-12 MED ORDER — ISOSORBIDE MONONITRATE ER 30 MG PO TB24: 30.0000 mg | ORAL_TABLET | Freq: Every day | ORAL | Status: DC

## 2015-01-12 MED ORDER — PNEUMOCOCCAL VAC POLYVALENT 25 MCG/0.5ML IJ INJ: 0.5000 mL | INJECTION | INTRAMUSCULAR | Status: DC

## 2015-01-12 MED ORDER — PNEUMOCOCCAL VAC POLYVALENT 25 MCG/0.5ML IJ INJ
0.5000 mL | INJECTION | INTRAMUSCULAR | Status: AC
  Administered 2015-01-12: 0.5 mL via INTRAMUSCULAR
  Filled 2015-01-12: qty 0.5

## 2015-01-12 MED ORDER — INFLUENZA VAC SPLIT QUAD 0.5 ML IM SUSY
0.5000 mL | PREFILLED_SYRINGE | INTRAMUSCULAR | Status: AC
  Administered 2015-01-12: 0.5 mL via INTRAMUSCULAR
  Filled 2015-01-12: qty 0.5

## 2015-01-12 MED ORDER — TECHNETIUM TC 99M SESTAMIBI GENERIC - CARDIOLITE
10.0000 | Freq: Once | INTRAVENOUS | Status: AC | PRN
  Administered 2015-01-12: 10 via INTRAVENOUS

## 2015-01-12 MED ORDER — REGADENOSON 0.4 MG/5ML IV SOLN
0.4000 mg | Freq: Once | INTRAVENOUS | Status: AC
  Administered 2015-01-12: 0.4 mg via INTRAVENOUS

## 2015-01-12 MED ORDER — ISOSORBIDE MONONITRATE ER 30 MG PO TB24
30.0000 mg | ORAL_TABLET | Freq: Every day | ORAL | Status: DC
  Administered 2015-01-12: 30 mg via ORAL
  Filled 2015-01-12 (×3): qty 1

## 2015-01-12 MED ORDER — TECHNETIUM TC 99M SESTAMIBI GENERIC - CARDIOLITE
30.0000 | Freq: Once | INTRAVENOUS | Status: AC | PRN
  Administered 2015-01-12: 30 via INTRAVENOUS

## 2015-01-12 MED ORDER — REGADENOSON 0.4 MG/5ML IV SOLN
INTRAVENOUS | Status: AC
  Filled 2015-01-12: qty 5

## 2015-01-12 MED ORDER — ATORVASTATIN CALCIUM 80 MG PO TABS: 80.0000 mg | ORAL_TABLET | Freq: Every day | ORAL | Status: DC

## 2015-01-12 NOTE — Progress Notes (Signed)
Patient Name: Wyatt Mason Date of Encounter: 01/12/2015   SUBJECTIVE  Patient presented for Lexiscan. Tolerated procedure well.  Result to follow. Denied CP.   CURRENT MEDS . aspirin EC  81 mg Oral Daily  . atorvastatin  80 mg Oral q1800  . fluorouracil   Topical Daily  . [START ON 01/13/2015] Influenza vac split quadrivalent PF  0.5 mL Intramuscular Tomorrow-1000  . metoprolol tartrate  25 mg Oral BID  . multivitamin with minerals  1 tablet Oral Daily  . [START ON 01/13/2015] pneumococcal 23 valent vaccine  0.5 mL Intramuscular Tomorrow-1000  . regadenoson      . sodium chloride  3 mL Intravenous Q12H  . ticagrelor  90 mg Oral BID    OBJECTIVE  Filed Vitals:   01/12/15 0922 01/12/15 0923 01/12/15 0925 01/12/15 0927  BP: 187/84 209/74 194/76 170/72  Pulse: 71 85 83 80  Temp:      TempSrc:      Resp:      Height:      Weight:      SpO2:        Intake/Output Summary (Last 24 hours) at 01/12/15 0930 Last data filed at 01/12/15 0900  Gross per 24 hour  Intake      0 ml  Output   1400 ml  Net  -1400 ml   Filed Weights   01/11/15 1146 01/11/15 1851 01/12/15 0612  Weight: 258 lb 9.6 oz (117.3 kg) 253 lb 1.6 oz (114.805 kg) 252 lb 12.8 oz (114.669 kg)    PHYSICAL EXAM  General: Pleasant, NAD. Neuro: Alert and oriented X 3. Moves all extremities spontaneously. Psych: Normal affect. HEENT:  Normal  Neck: Supple without bruits or JVD. Lungs:  Resp regular and unlabored, CTA. Heart: RRR no s3, s4, or murmurs. Abdomen: Soft, non-tender, non-distended, BS + x 4.  Extremities: No clubbing, cyanosis or edema. DP/PT/Radials 2+ and equal bilaterally.  Accessory Clinical Findings  CBC  Recent Labs  01/11/15 1852 01/12/15 0655  WBC 6.0 5.5  NEUTROABS 3.6  --   HGB 12.9* 12.4*  HCT 36.9* 35.4*  MCV 90.7 89.4  PLT 156 148*   Basic Metabolic Panel  Recent Labs  01/11/15 1145 01/11/15 1852  NA 140 136  K 4.0 3.7  CL 105 99*  CO2 28 27  GLUCOSE 116*  149*  BUN 15 13  CREATININE 0.86 0.84  CALCIUM 8.9 8.5*   Liver Function Tests  Recent Labs  01/11/15 1852  AST 26  ALT 24  ALKPHOS 55  BILITOT 1.2  PROT 6.2*  ALBUMIN 3.8   No results for input(s): LIPASE, AMYLASE in the last 72 hours. Cardiac Enzymes  Recent Labs  01/11/15 1852 01/12/15 0040 01/12/15 0655  TROPONINI <0.03 <0.03 <0.03   BNP Invalid input(s): POCBNP D-Dimer No results for input(s): DDIMER in the last 72 hours. Hemoglobin A1C No results for input(s): HGBA1C in the last 72 hours. Fasting Lipid Panel  Recent Labs  01/12/15 0040  CHOL 174  HDL 65  LDLCALC 87  TRIG 108  CHOLHDL 2.7   Thyroid Function Tests  Recent Labs  01/11/15 1852  TSH 3.035     Radiology/Studies  Dg Chest 2 View  01/11/2015   CLINICAL DATA:  Central chest pain since this morning history of hypertension  EXAM: CHEST  2 VIEW  COMPARISON:  09/02/2005  FINDINGS: The heart size and mediastinal contours are within normal limits. Both lungs are clear. The visualized skeletal structures are  unremarkable.  IMPRESSION: No active cardiopulmonary disease.   Electronically Signed   By: Esperanza Heir M.D.   On: 01/11/2015 14:02    ASSESSMENT AND PLAN Principal Problem:   Chest pain Active Problems:   HYPERLIPIDEMIA   Essential hypertension   CAD (coronary artery disease), native coronary artery   CAD (coronary artery disease)   Plan: trop x 4 negative. BNP of 105. TSH normal. Repeat EKG this morning without acute abnormality with 1st degree AV block. BP elevated. Consider adding another agent. Pending lexiscan result.   Signed, Bhagat,Bhavinkumar PA-C Pager [phone]  I have seen and examined the patient along with Bhagat,Bhavinkumar PA-C.  I have reviewed the chart, notes and new data.  I agree with PA's note.  Key new complaints: no recurrent angina; at presentation, symptoms resolved after one SL NTG Key examination changes: no signs of HF, no arrhythmia Key new  findings / data: normal ecg, normal biomarkers; on my preliminary review, no ischemia on nuclear study , normal wall motion and EF 55%. Inferior wall abnormality may be nontransmural scar or diaphragmatic attenuation  PLAN: If final report confirms my evaluation, DC home on low dose long acting nitrate (vasospasm?) and F/U with Dr. Excell Seltzer.  Thurmon Fair, MD, [facility] [phone] 01/12/2015, 11:49 AM

## 2015-01-12 NOTE — Discharge Summary (Signed)
Discharge Summary   Patient ID: Wyatt Mason,  MRN: [id number], DOB/AGE: Jul 20, 1932 81 y.o.  Admit date: 01/11/2015 Discharge date: 01/12/2015  Primary Care Moishe Schellenberg: Marga Melnick Primary Cardiologist: Dr. Copper  Discharge Diagnoses Principal Problem:   Chest pain Active Problems:   HYPERLIPIDEMIA   Essential hypertension   CAD (coronary artery disease), native coronary artery   CAD (coronary artery disease)   Allergies No Known Allergies  Procedures  NM Myocar Multi W/Spect W/Wall Motion / EF: 01/12/15     Study Result      There was no ST segment deviation noted during stress.  No T wave inversion was noted during stress.  The study is normal.  This is a low risk study.  The left ventricular ejection fraction is normal (55-65%).  This study is significantly affected by artifacts causing decreased uptake in the basal and mid inferior and inferolateral walls and apical wall at rest, however they all improve with stress.  There is no ischemia. Low risk study.      History of Present Illness  Wyatt Mason is a 81 y.o. male with a history of CAD status post non-STEMI in 12/14 treated with a DES 2 to the obtuse marginal branch of the LCx. Catheterization at that time demonstrated moderate nonobstructive disease in LAD and RCA which was treated medically. Last seen by Dr. Excell Seltzer 02/2014. He has not had a stress test since his cath. He has been in his USOH since then. 01/11/15, he was getting ready for [facility] and felt a little sweaty. While at [facility] he developed tightness across his chest with no radiation similar to his prior angina. He took a SL NTG and headed to [facility] ER. By the time he got to the ER the pain had resolved. He denied any associated SOB, nausea or diaphoresis. CP free in ED. Initial EKG with no acute changes and initial trop is normal. He denies any recent CP or DOE. He was active walking outside up until May when it got too  hot.  LHC 03/2013 LM: Mild calcification LAD: Proximal 50% LCx: Mid OM 90% then sequential 95% RCA: Mid 50-70% PCI: 4 x 12 mm Promus DES to the proximal OM, 4 x 12 mm Promus DES to the mid OM   Echo 12/14  Mild LVH, EF 55-60%, grade 1 diastolic dysfunction, mild LAE, mild RAE, possible mobile plaque in aortic arch.  He had been on crestor  daily which was changed to Liptor  daily due to cost.  Hospital Course  He was admitted overnight for observation with plan to do Lexiscan in morning.  Last LDL on last check 04/2014 was 99 and goal < 70, Increased to  daily. Trop x 4 negative. BNP of 105. TSH normal. Repeat EKG without acute abnormality. No further chest pain. Myoview was low risk, no ischemia. Imdur was added for possible coronary vasospasm and high blood pressure.   She has been seen by Dr. Royann Shivers today and deemed ready for discharge home. All follow-up appointments have been scheduled. Discharge medications are listed below.    He will need repeat FLP in 6 weeks with lipid panel.   Discharge Vitals Blood pressure 170/72, pulse 80, temperature 98.5 F (36.9 C), temperature source Oral, resp. rate 19, height  (1.854 m), weight 252 lb 12.8 oz (114.669 kg), SpO2 97 %.  Filed Weights   01/11/15 1146 01/11/15 1851 01/12/15 0612  Weight: 258 lb 9.6 oz (117.3 kg) 253 lb 1.6 oz (  114.805 kg) 252 lb 12.8 oz (114.669 kg)    Labs  CBC  Recent Labs  01/11/15 1852 01/12/15 0655  WBC 6.0 5.5  NEUTROABS 3.6  --   HGB 12.9* 12.4*  HCT 36.9* 35.4*  MCV 90.7 89.4  PLT 156 148*   Basic Metabolic Panel  Recent Labs  01/11/15 1145 01/11/15 1852  NA 140 136  K 4.0 3.7  CL 105 99*  CO2 28 27  GLUCOSE 116* 149*  BUN 15 13  CREATININE 0.86 0.84  CALCIUM 8.9 8.5*   Liver Function Tests  Recent Labs  01/11/15 1852  AST 26  ALT 24  ALKPHOS 55  BILITOT 1.2  PROT 6.2*  ALBUMIN 3.8   No results for input(s): LIPASE, AMYLASE in the last 72  hours. Cardiac Enzymes  Recent Labs  01/11/15 1852 01/12/15 0040 01/12/15 0655  TROPONINI <0.03 <0.03 <0.03   Fasting Lipid Panel  Recent Labs  01/12/15 0040  CHOL 174  HDL 65  LDLCALC 87  TRIG 108  CHOLHDL 2.7   Thyroid Function Tests  Recent Labs  01/11/15 1852  TSH 3.035    Disposition  Pt is being discharged home today in good condition.  Follow-up Plans & Appointments  Follow-up Information    Follow up with Ronie Spies, PA-C On 01/27/2015.   Specialties:  Cardiology, Radiology   Why:   for cardiology f/u   Contact information:   [address] [place] [address] [phone]           Discharge Instructions    Diet - low sodium heart healthy    Complete by:  As directed      Increase activity slowly    Complete by:  As directed          F/u Labs/Studies: He will need repeat FLP in 6 weeks with lipid panel.  Discharge Medications    Medication List    STOP taking these medications        rosuvastatin 40 MG tablet  Commonly known as:  CRESTOR      TAKE these medications        ALEVE PM 220-25 MG Tabs  Generic drug:  Naproxen Sod-Diphenhydramine  Take 1 tablet by mouth every 5 (five) minutes as needed (chest pain).     aspirin EC 81 MG tablet  Take 81 mg by mouth daily.     atorvastatin 80 MG tablet  Commonly known as:  LIPITOR  Take 1 tablet (80 mg total) by mouth daily.     BRILINTA 90 MG Tabs tablet  Generic drug:  ticagrelor  take 1 tablet by mouth twice a day     CORICIDIN 2-325 MG Tabs  Generic drug:  Chlorpheniramine-APAP  Take 1 tablet by mouth daily as needed (allergy symptoms).     fluorouracil 5 % cream  Commonly known as:  EFUDEX  Apply topically daily.     isosorbide mononitrate 30 MG 24 hr tablet  Commonly known as:  IMDUR  Take 1 tablet (30 mg total) by mouth daily.     metoprolol tartrate 25 MG tablet  Commonly known as:  LOPRESSOR  take 1 tablet by mouth twice a day      multivitamin with minerals Tabs tablet  Take 1 tablet by mouth daily.     nitroGLYCERIN 0.4 MG SL tablet  Commonly known as:  NITROSTAT  Place 1 tablet (0.4 mg total) under the tongue every 5 (five) minutes x 3 doses  as needed for chest pain.     Vitamin D-3 1000 UNITS Caps  Take by mouth daily.        Duration of Discharge Encounter   Greater than 30 minutes including physician time.  Signed, Bhagat,Bhavinkumar PA-C 01/12/2015, 1:46 PM

## 2015-01-12 NOTE — Progress Notes (Signed)
Pt/family given discharge instructions, medication lists, follow up appointments, and when to call the doctor.  Pt/family verbalizes understanding. Burton, Sandra McClintock, RN   

## 2015-01-13 ENCOUNTER — Telehealth: Payer: Self-pay | Admitting: *Deleted

## 2015-01-13 NOTE — Telephone Encounter (Signed)
Pt was on tcm list was admitted for chest pain had NM Myocar w/spec EF pt D/C  01/12/15 and will be f/u woth cardiology 01/27/15...Raechel Chute

## 2015-01-26 ENCOUNTER — Encounter: Payer: Self-pay | Admitting: Physician Assistant

## 2015-01-26 NOTE — Progress Notes (Signed)
Cardiology Office Note Date:  01/27/2015  Patient ID:  Wyatt Mason, Wyatt Mason [birth year], MRN [id number] PCP:  Marga Melnick, MD  Cardiologist: Dr. Excell Seltzer  Chief Complaint: f/u hospitalization for chest pain  History of Present Illness: Wyatt Mason is a 81 y.o. male with history of CAD (NSTEMI 03/2013 s/p DESx2 to OM with moderate residual dz in LAD/RCA), HTN, HLD, obesity who presents for post-hospital follow-up. He was recently admitted last month with chest pain and sweating. EKG showed no acute changes and he ruled out for MI. BNP 105. TSH WNL. He underwent nuclear stress test which was significantly affected by artifact causing decreased uptake at rest which improved with stress - no ischemia, low risk study, EF 55-65%. Imdur was added for possible coronary vasospasm and high blood pressure. He had been on Crestor  daily which was changed to Liptor due to cost. CBC showed mild anemia with Hgb 12.4, glucose up to 149 this admission, LFTs OK.  He comes in today for follow-up doing well. He has not had any recurrent symptoms. Blood pressure is controlled. He reports he tries to follow a balanced diet (fish, chicken, vegetables, fruits) but he does really enjoy cake. He is originally from [place].   Past Medical History  Diagnosis Date  . Hypertension   . Shingles 2005  . Obesity   . Hyperlipidemia   . CAD (coronary artery disease) 04/15/2013    a. s/p DES x 2 to mid OM, residual RCA and LAD borderline CAD. b. Nuc 12/2014 - low risk.  Marland Kitchen Hyperglycemia     Past Surgical History  Procedure Laterality Date  . Total hip arthroplasty Right 2005  . Appendectomy    . Coronary angioplasty with stent placement  04/15/2013    sequential 90% then 95% mid OM lesions s/p DES x 2, residual 50% prox LAD and 50-70% mid RCA stenoses; LV gram deferred  . Left heart catheterization with coronary angiogram N/A 04/15/2013    Procedure: LEFT HEART CATHETERIZATION WITH CORONARY ANGIOGRAM;   Surgeon: Micheline Chapman, MD;  Location: [facility] CATH LAB;  Service: Cardiovascular;  Laterality: N/A;  . No colonoscopy      SOC reviewed 04/29/14    Current Outpatient Prescriptions  Medication Sig Dispense Refill  . aspirin EC 81 MG tablet Take 81 mg by mouth daily.    Marland Kitchen atorvastatin (LIPITOR) 80 MG tablet Take 1 tablet (80 mg total) by mouth daily. 30 tablet 11  . BRILINTA 90 MG TABS tablet take 1 tablet by mouth twice a day 60 tablet 10  . Chlorpheniramine-APAP (CORICIDIN) 2-325 MG TABS Take 1 tablet by mouth daily as needed (allergy symptoms).    . Cholecalciferol (VITAMIN D-3) 1000 UNITS CAPS Take 1 capsule by mouth daily.     . fluorouracil (EFUDEX) 5 % cream Apply 1 application topically daily.   0  . isosorbide mononitrate (IMDUR) 30 MG 24 hr tablet Take 1 tablet (30 mg total) by mouth daily. 30 tablet 11  . metoprolol tartrate (LOPRESSOR) 25 MG tablet take 1 tablet by mouth twice a day 180 tablet 1  . Multiple Vitamin (MULTIVITAMIN WITH MINERALS) TABS tablet Take 1 tablet by mouth daily.    . nitroGLYCERIN (NITROSTAT) 0.4 MG SL tablet Place 1 tablet (0.4 mg total) under the tongue every 5 (five) minutes x 3 doses as needed for chest pain. 25 tablet 3   No current facility-administered medications for this visit.    Allergies:   Review of patient's allergies indicates  no known allergies.   Social History:  The patient  reports that he has never smoked. He does not have any smokeless tobacco history on file. He reports that he drinks about 1.2 oz of alcohol per week. He reports that he does not use illicit drugs.   Family History:  The patient's family history includes Heart attack in his father. There is no history of CVA, Cancer, Diabetes, or Stroke.  ROS:  Please see the history of present illness.  All other systems are reviewed and otherwise negative.   PHYSICAL EXAM:  VS:  BP 134/66 mmHg  Pulse 60  Ht  (1.854 m)  Wt 253 lb (114.76 kg)  BMI 33.39 kg/m2 BMI: Body mass  index is 33.39 kg/(m^2). Well nourished, well developed obese WM, in no acute distress HEENT: normocephalic, atraumatic Neck: no JVD, carotid bruits or masses Cardiac:  normal S1, S2; RRR; no murmurs, rubs, or gallops Lungs:  clear to auscultation bilaterally, no wheezing, rhonchi or rales Abd: soft, nontender, no hepatomegaly, + BS MS: no deformity or atrophy Ext: no edema Skin: warm and dry, no rash Neuro:  moves all extremities spontaneously, no focal abnormalities noted, follows commands Psych: euthymic mood, full affect   EKG:  Done today shows NSR 60bpm 1st degree AVB, no acute ST-T changes  Recent Labs: 01/11/2015: ALT 24; B Natriuretic Peptide 105.6*; BUN 13; Creatinine, Ser 0.84; Potassium 3.7; Sodium 136; TSH 3.035 01/12/2015: Hemoglobin 12.4*; Platelets 148*  01/12/2015: Cholesterol 174; HDL 65; LDL Cholesterol 87; Total CHOL/HDL Ratio 2.7; Triglycerides 108; VLDL 22   Estimated Creatinine Clearance: 93.2 mL/min (by C-G formula based on Cr of 0.84).   Wt Readings from Last 3 Encounters:  01/27/15 253 lb (114.76 kg)  01/12/15 252 lb 12.8 oz (114.669 kg)  10/03/14 254 lb (115.214 kg)     Other studies reviewed: Additional studies/records reviewed today include: summarized above  ASSESSMENT AND PLAN:  1. CAD as above - stable nuc as above. No further symptoms. Continue DAPT, BB, statin and Imdur. Ultimate duration of DAPT will be at discretion of primary cardiologist. 2. Essential HTN - improved on present regimen. 3. Hyperlipidemia - Crestor recently changed to Lipitor due to cost. Recheck lipid/liver when he comes in for f/u appointment with Dr. Excell Seltzer in 2 months. 4. Hyperglycemia - question pre-diabetes versus diabetes. I offered to check A1C today to further assess but he would like to f/u with PCP regarding this. We discussed reduction of sugar in diet. 5. Mild anemia during recent hospitalization - no evidence of bleeding. Monitoring per primary care.  Disposition:  F/u with Dr. Excell Seltzer as scheduled in December.  Current medicines are reviewed at length with the patient today.  The patient did not have any concerns regarding medicines.  Thomasene Mohair PA-C 01/27/2015 11:23 AM     [facility] [address] [place] [address] [phone] (office)  [phone] (fax)

## 2015-01-27 ENCOUNTER — Encounter: Payer: Self-pay | Admitting: Physician Assistant

## 2015-01-27 ENCOUNTER — Ambulatory Visit (INDEPENDENT_AMBULATORY_CARE_PROVIDER_SITE_OTHER): Payer: Medicare Other | Admitting: Physician Assistant

## 2015-01-27 VITALS — BP 134/66 | HR 60 | Ht 73.0 in | Wt 253.0 lb

## 2015-01-27 DIAGNOSIS — I2510 Atherosclerotic heart disease of native coronary artery without angina pectoris: Secondary | ICD-10-CM | POA: Diagnosis not present

## 2015-01-27 DIAGNOSIS — D649 Anemia, unspecified: Secondary | ICD-10-CM

## 2015-01-27 DIAGNOSIS — Z9861 Coronary angioplasty status: Secondary | ICD-10-CM | POA: Diagnosis not present

## 2015-01-27 DIAGNOSIS — R739 Hyperglycemia, unspecified: Secondary | ICD-10-CM

## 2015-01-27 DIAGNOSIS — I10 Essential (primary) hypertension: Secondary | ICD-10-CM

## 2015-01-27 DIAGNOSIS — E785 Hyperlipidemia, unspecified: Secondary | ICD-10-CM | POA: Diagnosis not present

## 2015-01-27 MED ORDER — NITROGLYCERIN 0.4 MG SL SUBL: 0.4000 mg | SUBLINGUAL_TABLET | SUBLINGUAL | Status: DC | PRN

## 2015-01-27 NOTE — Patient Instructions (Signed)
Medication Instructions:  Your physician recommends that you continue on your current medications as directed. Please refer to the Current Medication list given to you today.    Labwork:  LFT AND LIPID IN DEC   Testing/Procedures: NONE ORDER TODAY   Follow-Up:  AS SCHEDULED WITH  Dr. Excell Seltzer on 04/08/15 @ 8:45   Any Other Special Instructions Will Be Listed Below (If Applicable).

## 2015-01-30 DIAGNOSIS — M2041 Other hammer toe(s) (acquired), right foot: Secondary | ICD-10-CM | POA: Diagnosis not present

## 2015-01-30 DIAGNOSIS — Q828 Other specified congenital malformations of skin: Secondary | ICD-10-CM | POA: Diagnosis not present

## 2015-01-30 DIAGNOSIS — M2021 Hallux rigidus, right foot: Secondary | ICD-10-CM | POA: Diagnosis not present

## 2015-02-09 ENCOUNTER — Other Ambulatory Visit: Payer: Self-pay | Admitting: Emergency Medicine

## 2015-02-09 MED ORDER — METOPROLOL TARTRATE 25 MG PO TABS: 25.0000 mg | ORAL_TABLET | Freq: Two times a day (BID) | ORAL | Status: DC

## 2015-04-08 ENCOUNTER — Ambulatory Visit (INDEPENDENT_AMBULATORY_CARE_PROVIDER_SITE_OTHER): Payer: Medicare Other | Admitting: Cardiovascular Disease

## 2015-04-08 ENCOUNTER — Other Ambulatory Visit (INDEPENDENT_AMBULATORY_CARE_PROVIDER_SITE_OTHER): Payer: Medicare Other | Admitting: *Deleted

## 2015-04-08 ENCOUNTER — Encounter: Payer: Self-pay | Admitting: Cardiovascular Disease

## 2015-04-08 VITALS — BP 130/72 | HR 64 | Ht 72.0 in | Wt 253.4 lb

## 2015-04-08 DIAGNOSIS — Z9861 Coronary angioplasty status: Secondary | ICD-10-CM | POA: Diagnosis not present

## 2015-04-08 DIAGNOSIS — I2510 Atherosclerotic heart disease of native coronary artery without angina pectoris: Secondary | ICD-10-CM

## 2015-04-08 LAB — HEPATIC FUNCTION PANEL
ALT: 29 U/L (ref 9–46)
AST: 26 U/L (ref 10–35)
Albumin: 4.1 g/dL (ref 3.6–5.1)
Alkaline Phosphatase: 64 U/L (ref 40–115)
BILIRUBIN DIRECT: 0.2 mg/dL (ref <=0.2)
Indirect Bilirubin: 0.6 mg/dL (ref 0.2–1.2)
TOTAL PROTEIN: 6.8 g/dL (ref 6.1–8.1)
Total Bilirubin: 0.8 mg/dL (ref 0.2–1.2)

## 2015-04-08 LAB — LIPID PANEL
CHOL/HDL RATIO: 1.8 ratio (ref <=5.0)
CHOLESTEROL: 131 mg/dL (ref 125–200)
HDL: 72 mg/dL (ref >=40)
LDL Cholesterol: 46 mg/dL (ref <130)
TRIGLYCERIDES: 64 mg/dL (ref <150)
VLDL: 13 mg/dL (ref <30)

## 2015-04-08 NOTE — Addendum Note (Signed)
Addended by: Tonita PhoenixBOWDEN, ROBIN K on: 04/08/2015 08:01 AM   Modules accepted: Orders

## 2015-04-08 NOTE — Progress Notes (Signed)
Cardiology Office Note Date:  04/08/2015   ID:  Romon, Wyatt Mason, MRN [id number]  PCP:  Wyatt Melnick, MD  Cardiologist:  Wyatt Bollman, MD    Chief Complaint  Patient presents with  . Coronary Artery Disease    History of Present Illness: Wyatt Mason is a 81 y.o. male who presents for follow-up evaluation. The patient is followed for coronary artery disease and initially presented with a non-ST elevation infarction in December 2014. He was found to have moderate stenosis in the LAD and right coronary artery and critical stenosis in the left circumflex distribution. He was treated with drug-eluting stents in the obtuse marginal branches of the left circumflex. Medical therapy was recommended for his residual disease. He's been treated with aspirin and brilinta. The patient denies any bleeding complications. He recently presented with chest pain and was hospitalized for observation. He had a nuclear scan performed which demonstrated no significant ischemia and it was considered low risk. Ongoing medical therapy was recommended. He's had no recurrence of symptoms. He specifically denies exertional chest pain or pressure. He denies dyspnea, edema, or heart palpitations. He is tolerating his medications well. He has changed from Crestor to generic atorvastatin because of cost. Labs were drawn this morning.   Past Medical History  Diagnosis Date  . Hypertension   . Shingles 2005  . Obesity   . Hyperlipidemia   . CAD (coronary artery disease) 04/15/2013    a. s/p DES x 2 to mid OM, residual RCA and LAD borderline CAD. b. Nuc 12/2014 - low risk.  Marland Kitchen Hyperglycemia     Past Surgical History  Procedure Laterality Date  . Total hip arthroplasty Right 2005  . Appendectomy    . Coronary angioplasty with stent placement  04/15/2013    sequential 90% then 95% mid OM lesions s/p DES x 2, residual 50% prox LAD and 50-70% mid RCA stenoses; LV gram deferred  . Left heart  catheterization with coronary angiogram N/A 04/15/2013    Procedure: LEFT HEART CATHETERIZATION WITH CORONARY ANGIOGRAM;  Surgeon: Micheline Chapman, MD;  Location: [facility] CATH LAB;  Service: Cardiovascular;  Laterality: N/A;  . No colonoscopy      SOC reviewed 04/29/14    Current Outpatient Prescriptions  Medication Sig Dispense Refill  . aspirin EC 81 MG tablet Take 81 mg by mouth daily.    Marland Kitchen atorvastatin (LIPITOR) 80 MG tablet Take 1 tablet (80 mg total) by mouth daily. 30 tablet 11  . Chlorpheniramine-APAP (CORICIDIN) 2-325 MG TABS Take 1 tablet by mouth daily as needed (allergy symptoms).    . Cholecalciferol (VITAMIN D-3) 1000 UNITS CAPS Take 1 capsule by mouth daily.     . isosorbide mononitrate (IMDUR) 30 MG 24 hr tablet Take 1 tablet (30 mg total) by mouth daily. 30 tablet 11  . metoprolol tartrate (LOPRESSOR) 25 MG tablet Take 1 tablet (25 mg total) by mouth 2 (two) times daily. 180 tablet 1  . Multiple Vitamin (MULTIVITAMIN WITH MINERALS) TABS tablet Take 1 tablet by mouth daily.    . nitroGLYCERIN (NITROSTAT) 0.4 MG SL tablet Place 1 tablet (0.4 mg total) under the tongue every 5 (five) minutes x 3 doses as needed for chest pain. 25 tablet 3   No current facility-administered medications for this visit.    Allergies:   Review of patient's allergies indicates no known allergies.   Social History:  The patient  reports that he has never smoked. He does not have any smokeless  tobacco history on file. He reports that he drinks about 1.2 oz of alcohol per week. He reports that he does not use illicit drugs.   Family History:  The patient's  family history includes Heart attack in his father. There is no history of CVA, Cancer, Diabetes, or Stroke.    ROS:  Please see the history of present illness.  Otherwise, review of systems is positive for muscle pain, snoring, urinary hesitancy.  All other systems are reviewed and negative.    PHYSICAL EXAM: VS:  BP 130/72 mmHg  Pulse 64  Ht 6'  (1.829 m)  Wt 253 lb 6.4 oz (114.941 kg)  BMI 34.36 kg/m2  SpO2 97% , BMI Body mass index is 34.36 kg/(m^2). GEN: Well nourished, well developed, in no acute distress HEENT: normal Neck: no JVD, no masses. No carotid bruits Cardiac: RRR without murmur or gallop                Respiratory:  clear to auscultation bilaterally, normal work of breathing GI: soft, nontender, nondistended, + BS MS: no deformity or atrophy Ext: no pretibial edema, pedal pulses 2+= bilaterally Skin: warm and dry, no rash Neuro:  Strength and sensation are intact Psych: euthymic mood, full affect  EKG:  EKG is not ordered today.  Recent Labs: 01/11/2015: ALT 24; B Natriuretic Peptide 105.6*; BUN 13; Creatinine, Ser 0.84; Potassium 3.7; Sodium 136; TSH 3.035 01/12/2015: Hemoglobin 12.4*; Platelets 148*   Lipid Panel     Component Value Date/Time   CHOL 174 01/12/2015 0040   TRIG 108 01/12/2015 0040   HDL 65 01/12/2015 0040   CHOLHDL 2.7 01/12/2015 0040   VLDL 22 01/12/2015 0040   LDLCALC 87 01/12/2015 0040      Wt Readings from Last 3 Encounters:  04/08/15 253 lb 6.4 oz (114.941 kg)  01/27/15 253 lb (114.76 kg)  01/12/15 252 lb 12.8 oz (114.669 kg)     Cardiac Studies Reviewed: Cardiac Cath 04-15-2013: Final Conclusions:  1. Severe single-vessel coronary artery disease with successful PCI of the left circumflex 2. Moderate LAD and RCA stenoses   Recommendations:  Dual antiplatelet therapy with aspirin and brilinta for at least 12 months. Aggressive medical therapy for the patient's residual coronary artery disease. We'll check a 2-D echocardiogram to assess left ventricular function.  Nuclear scan 01/12/2015:  There was no ST segment deviation noted during stress.  No T wave inversion was noted during stress.  The study is normal.  This is a low risk study.  The left ventricular ejection fraction is normal (55-65%).  This study is significantly affected by artifacts causing decreased  uptake in the basal and mid inferior and inferolateral walls and apical wall at rest, however they all improve with stress.  There is no ischemia. Low risk study  ASSESSMENT AND PLAN: 1.  CAD, native vessel: The patient has no recurrence of angina. His medications are reviewed. He is two-year is out from his event and I think at this time we should discontinue brilinta. He will continue on low-dose daily aspirin. Other medications reviewed and no changes are made.  2. Essential hypertension: Blood pressure well controlled on metoprolol  3. Hyperlipidemia: The patient continues on high intensity statin therapy with atorvastatin 80 mg daily. Lipids and LFTs drawn this morning.  Current medicines are reviewed with the patient today.  The patient does not have concerns regarding medicines.  Labs/ tests ordered today include:  No orders of the defined types were placed in this encounter.  Disposition:   FU one year  Signed, Wyatt Bollman, MD  04/08/2015 9:08 AM    [facility] [address] [place], [place], [place]  [address] Phone: [phone]; Fax: [phone]

## 2015-04-08 NOTE — Patient Instructions (Signed)
Medication Instructions:  Your physician has recommended you make the following change in your medication:  1. STOP Brilinta  Labwork: No new orders.   Testing/Procedures: No new orders.   Follow-Up: Your physician wants you to follow-up in: 1 YEAR with Dr Cooper.  You will receive a reminder letter in the mail two months in advance. If you don't receive a letter, please call our office to schedule the follow-up appointment.   Any Other Special Instructions Will Be Listed Below (If Applicable).     If you need a refill on your cardiac medications before your next appointment, please call your pharmacy.   

## 2015-04-08 NOTE — Addendum Note (Signed)
Addended by: Tonita PhoenixBOWDEN, Paublo Warshawsky K on: 04/08/2015 08:00 AM   Modules accepted: Orders

## 2015-05-11 ENCOUNTER — Telehealth: Payer: Self-pay

## 2015-05-11 NOTE — Telephone Encounter (Signed)
Call to hte patient and currently doing taxes; Wife stated he can come in a little early tomorrow for his AWV prior to seeing Dr. Lawerance BachBurns at 1 pm

## 2015-05-12 ENCOUNTER — Encounter: Payer: Self-pay | Admitting: Internal Medicine

## 2015-05-12 ENCOUNTER — Ambulatory Visit (INDEPENDENT_AMBULATORY_CARE_PROVIDER_SITE_OTHER): Payer: Medicare Other | Admitting: Internal Medicine

## 2015-05-12 ENCOUNTER — Other Ambulatory Visit (INDEPENDENT_AMBULATORY_CARE_PROVIDER_SITE_OTHER): Payer: Medicare Other

## 2015-05-12 VITALS — BP 140/70 | HR 67 | Temp 98.1°F | Ht 73.0 in | Wt 257.0 lb

## 2015-05-12 DIAGNOSIS — I214 Non-ST elevation (NSTEMI) myocardial infarction: Secondary | ICD-10-CM | POA: Diagnosis not present

## 2015-05-12 DIAGNOSIS — Z0000 Encounter for general adult medical examination without abnormal findings: Secondary | ICD-10-CM | POA: Diagnosis not present

## 2015-05-12 DIAGNOSIS — E782 Mixed hyperlipidemia: Secondary | ICD-10-CM

## 2015-05-12 DIAGNOSIS — I10 Essential (primary) hypertension: Secondary | ICD-10-CM

## 2015-05-12 DIAGNOSIS — R739 Hyperglycemia, unspecified: Secondary | ICD-10-CM

## 2015-05-12 LAB — CBC WITH DIFFERENTIAL/PLATELET
BASOS PCT: 0.7 % (ref 0.0–3.0)
Basophils Absolute: 0.1 10*3/uL (ref 0.0–0.1)
EOS ABS: 0.3 10*3/uL (ref 0.0–0.7)
Eosinophils Relative: 3.6 % (ref 0.0–5.0)
HCT: 40.6 % (ref 39.0–52.0)
Hemoglobin: 13.6 g/dL (ref 13.0–17.0)
Lymphocytes Relative: 24.3 % (ref 12.0–46.0)
Lymphs Abs: 1.9 10*3/uL (ref 0.7–4.0)
MCHC: 33.6 g/dL (ref 30.0–36.0)
MCV: 91.6 fl (ref 78.0–100.0)
MONO ABS: 0.4 10*3/uL (ref 0.1–1.0)
Monocytes Relative: 5.5 % (ref 3.0–12.0)
NEUTROS ABS: 5.2 10*3/uL (ref 1.4–7.7)
NEUTROS PCT: 65.9 % (ref 43.0–77.0)
PLATELETS: 173 10*3/uL (ref 150.0–400.0)
RBC: 4.44 Mil/uL (ref 4.22–5.81)
RDW: 13.7 % (ref 11.5–15.5)
WBC: 7.8 10*3/uL (ref 4.0–10.5)

## 2015-05-12 LAB — COMPREHENSIVE METABOLIC PANEL
ALBUMIN: 4.4 g/dL (ref 3.5–5.2)
ALT: 21 U/L (ref 0–53)
AST: 20 U/L (ref 0–37)
Alkaline Phosphatase: 71 U/L (ref 39–117)
BUN: 13 mg/dL (ref 6–23)
CHLORIDE: 101 meq/L (ref 96–112)
CO2: 31 meq/L (ref 19–32)
CREATININE: 0.78 mg/dL (ref 0.40–1.50)
Calcium: 9.2 mg/dL (ref 8.4–10.5)
GFR: 101.62 mL/min (ref >60.00)
Glucose, Bld: 97 mg/dL (ref 70–99)
Potassium: 4.5 mEq/L (ref 3.5–5.1)
SODIUM: 140 meq/L (ref 135–145)
Total Bilirubin: 1.1 mg/dL (ref 0.2–1.2)
Total Protein: 7.2 g/dL (ref 6.0–8.3)

## 2015-05-12 LAB — HEMOGLOBIN A1C: Hgb A1c MFr Bld: 5.8 % (ref 4.6–6.5)

## 2015-05-12 LAB — TSH: TSH: 1.59 u[IU]/mL (ref 0.35–4.50)

## 2015-05-12 MED ORDER — ATORVASTATIN CALCIUM 80 MG PO TABS: 80.0000 mg | ORAL_TABLET | Freq: Every day | ORAL | Status: DC

## 2015-05-12 NOTE — Assessment & Plan Note (Signed)
Check a1c - has been in prediabetic range Increase exercise

## 2015-05-12 NOTE — Progress Notes (Signed)
Subjective:    Patient ID: Wyatt Mason, male    DOB: 1932/09/10, 82 y.o.   MRN: [id number]  HPI He is here to establish with a new pcp.   He is here for a physical exam.   He has redness, itching and dry skin on his left anterior ankle.  He put aquaphor on it, but not consistently.  It is still itchy and dry.  He wanted to make sure it is not related to diabetes.   He tends to eat fairly healthy.  He has not been exercising as much as he should.    He has slight swelling in his left wrist at the sight where they went in for the catherization. It is tender only with palpation.  Medications and allergies reviewed with patient and updated if appropriate.  Patient Active Problem List   Diagnosis Date Noted  . Chest pain 01/11/2015  . CAD (coronary artery disease) 01/11/2015  . Allergic rhinitis 10/03/2014  . Hyperglycemia 04/24/2013  . Obesity 04/16/2013  . Hypokalemia 04/16/2013  . CAD (coronary artery disease), native coronary artery 04/16/2013  . NSTEMI (non-ST elevated myocardial infarction) (HCC) 04/15/2013  . ONYCHOMYCOSIS, TOENAILS 12/22/2009  . HYPERLIPIDEMIA 03/23/2007  . BPH without obstruction/lower urinary tract symptoms 03/23/2007  . POSTHERPETIC NEURALGIA 06/22/2006  . Essential hypertension 06/22/2006    Current Outpatient Prescriptions on File Prior to Visit  Medication Sig Dispense Refill  . aspirin EC 81 MG tablet Take 81 mg by mouth daily.    Marland Kitchen atorvastatin (LIPITOR) 80 MG tablet Take 1 tablet (80 mg total) by mouth daily. 30 tablet 11  . Cholecalciferol (VITAMIN D-3) 1000 UNITS CAPS Take 1 capsule by mouth daily.     . isosorbide mononitrate (IMDUR) 30 MG 24 hr tablet Take 1 tablet (30 mg total) by mouth daily. 30 tablet 11  . metoprolol tartrate (LOPRESSOR) 25 MG tablet Take 1 tablet (25 mg total) by mouth 2 (two) times daily. 180 tablet 1  . Multiple Vitamin (MULTIVITAMIN WITH MINERALS) TABS tablet Take 1 tablet by mouth daily.    . nitroGLYCERIN  (NITROSTAT) 0.4 MG SL tablet Place 1 tablet (0.4 mg total) under the tongue every 5 (five) minutes x 3 doses as needed for chest pain. 25 tablet 3  . Chlorpheniramine-APAP (CORICIDIN) 2-325 MG TABS Take 1 tablet by mouth daily as needed (allergy symptoms). Reported on 05/12/2015     No current facility-administered medications on file prior to visit.    Past Medical History  Diagnosis Date  . Hypertension   . Shingles 2005  . Obesity   . Hyperlipidemia   . CAD (coronary artery disease) 04/15/2013    a. s/p DES x 2 to mid OM, residual RCA and LAD borderline CAD. b. Nuc 12/2014 - low risk.  Marland Kitchen Hyperglycemia     Past Surgical History  Procedure Laterality Date  . Total hip arthroplasty Right 2005  . Appendectomy    . Coronary angioplasty with stent placement  04/15/2013    sequential 90% then 95% mid OM lesions s/p DES x 2, residual 50% prox LAD and 50-70% mid RCA stenoses; LV gram deferred  . Left heart catheterization with coronary angiogram N/A 04/15/2013    Procedure: LEFT HEART CATHETERIZATION WITH CORONARY ANGIOGRAM;  Surgeon: Micheline Chapman, MD;  Location: [facility] CATH LAB;  Service: Cardiovascular;  Laterality: N/A;  . No colonoscopy      SOC reviewed 04/29/14    Social History   Social History  . Marital  Status: Married    Spouse Name: N/A  . Number of Children: N/A  . Years of Education: N/A   Social History Main Topics  . Smoking status: Never Smoker   . Smokeless tobacco: Not on file  . Alcohol Use: 1.2 oz/week    2 Shots of liquor per week     Comment: 2 to 3 times a week  . Drug Use: No  . Sexual Activity: Yes    Birth Control/ Protection: None   Other Topics Concern  . Not on file   Social History Narrative    Family History  Problem Relation Age of Onset  . Heart attack Father     onset in 30s; died   . CVA Neg Hx   . Cancer Neg Hx   . Diabetes Neg Hx   . Stroke Neg Hx     Review of Systems  Constitutional: Negative for fever and chills.  HENT:  Positive for rhinorrhea. Negative for hearing loss.   Eyes: Negative for visual disturbance.  Respiratory: Negative for cough, shortness of breath and wheezing.   Cardiovascular: Negative for chest pain, palpitations and leg swelling.  Gastrointestinal: Negative for nausea, abdominal pain, diarrhea, constipation and blood in stool.       No GERD  Genitourinary: Negative for dysuria, hematuria and difficulty urinating.  Musculoskeletal: Positive for arthralgias (worse with weather changes). Negative for back pain.  Neurological: Negative for dizziness, weakness, light-headedness, numbness and headaches.  Psychiatric/Behavioral: Negative for dysphoric mood. The patient is not nervous/anxious.        Objective:   Filed Vitals:   05/12/15 1222  BP: 140/70  Pulse: 67  Temp: 98.1 F (36.7 C)   Filed Weights   05/12/15 1222  Weight: 257 lb (116.574 kg)   Body mass index is 33.91 kg/(m^2).   Physical Exam  Constitutional: He appears well-developed and well-nourished.  HENT:  Head: Normocephalic and atraumatic.  Right Ear: External ear normal.  Left Ear: External ear normal.  Mouth/Throat: Oropharynx is clear and moist.  Moderate cerumen b/l ear canals, portions of TM visualized are normal.  Eyes: Conjunctivae and EOM are normal.  Neck: Neck supple. No tracheal deviation present. No thyromegaly present.  Cardiovascular: Normal rate and regular rhythm.   Murmur (2/6 systolic murmur) heard. Pulmonary/Chest: Breath sounds normal. No respiratory distress. He has no wheezes. He has no rales.  Abdominal: Soft. He exhibits no distension. There is no tenderness.  Ventral hernia  Genitourinary:  Deferred based on age  Musculoskeletal: He exhibits edema.  Lymphadenopathy:    He has no cervical adenopathy.  Skin: Rash (left anterior ankle - dry, slightly red, scaly) noted.  Possible aneurysm at site of catheterization-nontender, not firm  Psychiatric: He has a normal mood and affect.  His behavior is normal.   Constitutional: He appears well-developed and well-nourished. No distress.  HENT:  Head: Normocephalic and atraumatic.  Right Ear: External ear normal.  Left Ear: External ear normal.  Mouth/Throat: Oropharynx is clear and moist.  Normal ear canals and TM b/l  Eyes: Conjunctivae and EOM are normal.  Neck: Neck supple. No tracheal deviation present. No thyromegaly present.  No carotid bruit  Cardiovascular: Normal rate, regular rhythm, normal heart sounds and intact distal pulses.   No murmur heard. Pulmonary/Chest: Effort normal and breath sounds normal. No respiratory distress. He has no wheezes. He has no rales.  Abdominal: Soft. Bowel sounds are normal. He exhibits no distension. There is no tenderness.  Genitourinary:  deferred  Musculoskeletal: He exhibits no edema.  Lymphadenopathy:    He has no cervical adenopathy.  Skin: Skin is warm and dry. He is not diaphoretic.  Psychiatric: He has a normal mood and affect. His behavior is normal.      Assessment & Plan:   Physical exam Screening blood work ordered colonoscopy - never had it -- interested in cologuard - advised him to call his insurance company to see if it is covered Immunizations up to date - discussed zostavax - he has had shingles already EKG done by cardio Stressed increasing his exercise and ideally work on [employer] loss Healthy diet stressed Will check a1c with blood work - discussed his increase risk of diabetes.  Continue applying Aquaphor to rash on leg-this has been helpful, but he has not been doing it consistently  Will show his cardiologist the area on his wrist-possible aneurysm secondary to cardiac catheterization  See Problem List for Assessment and Plan of chronic medical problems.   Follow up annually

## 2015-05-12 NOTE — Progress Notes (Signed)
Pre visit review using our clinic review tool, if applicable. No additional management support is needed unless otherwise documented below in the visit note. 

## 2015-05-12 NOTE — Assessment & Plan Note (Signed)
BP Readings from Last 3 Encounters:  05/12/15 140/70  04/08/15 130/72  01/27/15 134/66   bp controlled Continue medications at current dose

## 2015-05-12 NOTE — Assessment & Plan Note (Signed)
Well controlled Continue lipitor 80 mg daily

## 2015-05-12 NOTE — Progress Notes (Signed)
Subjective:   Wyatt Mason is a 82 y.o. male who presents for Medicare Annual/Subsequent preventive examination.  Review of Systems:  HRA assessment completed during visit; Irving Shows The Patient was informed that this wellness visit is to identify risk and educate on how to reduce risk for increase disease through lifestyle changes.   ROS deferred to CPE exam with physician  Medical issues non ST infarction Dec 2014 Cardiology visit Dec 2016; stable (Lipids chol 131; Trig 64; HDL 72; LDL 46)  A1c 6.1 PSA 2.52 - 2008  BMI: was 242 at the end of 2015; BMI 33.9 Did change his diet; Try to eat as many vegetables;  Diet; Eat cereal; coffee; naval orange; blueberries;  Lunch  sandwich; doesn't fill him up Supper; chicken; fish; pasta;   Exercise; does the treadmill; bike;  Not doing it as much; [facility];  [employer]; behind fresh market  Motivation is 7 to 8;  Walking around the neighborhood;    SAFETY Safety reviewed for the home; including removal of clutter; clear paths through the home, eliminating clutter, railing as needed; bathroom safety; community safety; smoke detectors and firearms safety as well as sun protection;  Driving accidents and seatbelt Sun protection Stressors;   Medication review/ New meds  Fall assessment  Gait assessment  Mobilization and Functional losses in the last year. Sleep patterns   Urinary or fecal incontinence reviewed   Lifeline: http://www.lifelinesys.com/content/home; 778-540-2159 [phone]   Counseling: Colonoscopy; has never had one; discussed colo-guard;  EKG: 01/2015 Hearing: ;  Ophthalmology exam; last May; Scheduled May; Think minor Cataracts;  Immunizations Due Zostavax;  Td in 06/2002/ Tdap had this   Current Care Team reviewed and updated Dr. Tonny Bollman    Cardiac Risk Factors include: advanced age (>81men, >4 women);hypertension;male gender;obesity (BMI  >30kg/m2);dyslipidemia     Objective:    Vitals: BP 140/70 mmHg  Pulse 67  Temp(Src) 98.1 F (36.7 C)  Ht  (1.854 m)  Wt 257 lb (116.574 kg)  BMI 33.91 kg/m2  SpO2 95%  Tobacco History  Smoking status  . Never Smoker   Smokeless tobacco  . Not on file     Counseling given: Yes   Past Medical History  Diagnosis Date  . Hypertension   . Shingles 2005  . Obesity   . Hyperlipidemia   . CAD (coronary artery disease) 04/15/2013    a. s/p DES x 2 to mid OM, residual RCA and LAD borderline CAD. b. Nuc 12/2014 - low risk.  Marland Kitchen Hyperglycemia    Past Surgical History  Procedure Laterality Date  . Total hip arthroplasty Right 2005  . Appendectomy    . Coronary angioplasty with stent placement  04/15/2013    sequential 90% then 95% mid OM lesions s/p DES x 2, residual 50% prox LAD and 50-70% mid RCA stenoses; LV gram deferred  . Left heart catheterization with coronary angiogram N/A 04/15/2013    Procedure: LEFT HEART CATHETERIZATION WITH CORONARY ANGIOGRAM;  Surgeon: Micheline Chapman, MD;  Location: [facility] CATH LAB;  Service: Cardiovascular;  Laterality: N/A;  . No colonoscopy      SOC reviewed 04/29/14   Family History  Problem Relation Age of Onset  . Heart attack Father     onset in 30s; died   . CVA Neg Hx   . Cancer Neg Hx   . Diabetes Neg Hx   . Stroke Neg Hx    History  Sexual Activity  .  Sexual Activity: Yes  . Birth Control/ Protection: None    Outpatient Encounter Prescriptions as of 05/12/2015  Medication Sig  . aspirin EC 81 MG tablet Take 81 mg by mouth daily.  Marland Kitchen atorvastatin (LIPITOR) 80 MG tablet Take 1 tablet (80 mg total) by mouth daily.  . Cholecalciferol (VITAMIN D-3) 1000 UNITS CAPS Take 1 capsule by mouth daily.   . isosorbide mononitrate (IMDUR) 30 MG 24 hr tablet Take 1 tablet (30 mg total) by mouth daily.  . metoprolol tartrate (LOPRESSOR) 25 MG tablet Take 1 tablet (25 mg total) by mouth 2 (two) times daily.  . Multiple Vitamin  (MULTIVITAMIN WITH MINERALS) TABS tablet Take 1 tablet by mouth daily.  . nitroGLYCERIN (NITROSTAT) 0.4 MG SL tablet Place 1 tablet (0.4 mg total) under the tongue every 5 (five) minutes x 3 doses as needed for chest pain.  . [DISCONTINUED] atorvastatin (LIPITOR) 80 MG tablet Take 1 tablet (80 mg total) by mouth daily.  . Chlorpheniramine-APAP (CORICIDIN) 2-325 MG TABS Take 1 tablet by mouth daily as needed (allergy symptoms). Reported on 05/12/2015  . [DISCONTINUED] BRILINTA 90 MG TABS tablet    No facility-administered encounter medications on file as of 05/12/2015.    Activities of Daily Living In your present state of health, do you have any difficulty performing the following activities: 05/12/2015 01/12/2015  Hearing? N N  Vision? N N  Difficulty concentrating or making decisions? N N  Walking or climbing stairs? N N  Dressing or bathing? N N  Doing errands, shopping? N N  Preparing Food and eating ? N -  Using the Toilet? N -  In the past six months, have you accidently leaked urine? N -  Do you have problems with loss of bowel control? N -  Managing your Medications? N -  Managing your Finances? N -  Housekeeping or managing your Housekeeping? N -    Patient Care Team: Pincus Sanes, MD as PCP - General (Internal Medicine)   Assessment:    Assessment   Patient presents for yearly preventative medicine examination. Medicare questionnaire screening were completed, i.e. Functional; fall risk; depression, memory loss and hearing were all unremarkable  All immunizations and health maintenance protocols were reviewed with the patient and needed orders were placed./ discussed shingles and has zoster x 1; has not had; Educated on on part d benefit/  Also states he had tetanus when he volunteered at [employer] in 2016; historical immunization updated  Education provided for laboratory screens;  Educated regarding A1c;  Medication reconciliation, past medical history, social  history, problem list and allergies were reviewed in detail with the patient   Goals were established with regard to weight loss, exercise, and diet in compliance with medications based on the patient individualized risk;  Discussed weight loss and impact on A1c; States motivation is a 7 or 8; Not ready to set a goal; Is thinking about gym alternatives;   End of life planning was discussed and has been compeleted   Exercise Activities and Dietary recommendations Current Exercise Habits:: Home exercise routine, Type of exercise: walking, Time (Minutes): 30  Goals    . patient     Plan to go to the [facility] more often;       Fall Risk Fall Risk  05/12/2015 04/29/2014 04/24/2013  Falls in the past year? No No No   Depression Screen PHQ 2/9 Scores 05/12/2015 04/29/2014 05/06/2013 04/24/2013  PHQ - 2 Score 0 0 0 0    Cognitive  Testing No flowsheet data found.  AD8 Score 0   Immunization History  Administered Date(s) Administered  . Influenza,inj,Quad PF,36+ Mos 01/12/2015  . Influenza-Unspecified 01/23/2013  . Pneumococcal Polysaccharide-23 01/12/2015  . Td 06/24/2002  . Tetanus 09/02/2014   Screening Tests Health Maintenance  Topic Date Due  . ZOSTAVAX  09/17/1994  . INFLUENZA VACCINE  11/24/2015  . PNA vac Low Risk Adult (2 of 2 - PCV13) 01/12/2016  . TETANUS/TDAP  09/01/2024      Plan:     Did states he had small cyst on inner aspect of left wrist; told to show the doctor; Cyst is firm; not red or irritated; does not interfere with function but is somewhat painful to touch.  During the course of the visit the patient was educated and counseled about the following appropriate screening and preventive services:   Vaccines to include Pneumoccal, Influenza, Hepatitis B, Td, Zostavax, HCV  Electrocardiogram /01/2015  Cardiovascular Disease/ BP 140 but had not taken BP meds  Colorectal cancer screening/ not completed; Will discuss colo guard with MD  Diabetes screening/  educated regarding pre-diabetes and weight loss, particularly around waist  Prostate Cancer Screening/ deferred to MD  Glaucoma screening/ neg; eye exams in may  Nutrition counseling / dsicussed  Smoking cessation counseling/ non smoker  Patient Instructions (the written plan) was given to the patient.    Montine Circle, RN  05/12/2015   Medical screening examination/treatment/procedure(s) were performed by non-physician practitioner and as supervising physician I was immediately available for consultation/collaboration. I agree with above. Pincus Sanes, MD

## 2015-05-12 NOTE — Assessment & Plan Note (Signed)
Currently asymptomatic Following with cardio Continue current meds Increase exercise and work on weight loss

## 2015-05-12 NOTE — Patient Instructions (Addendum)
Wyatt Mason , Thank you for taking time to come for your Medicare Wellness Visit. I appreciate your ongoing commitment to your health goals. Please review the following plan we discussed and let me know if I can assist you in the future.   May have colo-guard  - call your insurance company and see if it is covered and if it is let us know.   These are the goals we discussed: Goals    . patient     Plan to go to the [place] more often;        This is a list of the screening recommended for you and due dates:  Health Maintenance  Topic Date Due  . Shingles Vaccine  09/17/1994  . Flu Shot  11/24/2015  . Pneumonia vaccines (2 of 2 - PCV13) 01/12/2016  . Tetanus Vaccine  09/01/2024   Health Maintenance, Male A healthy lifestyle and preventative care can promote health and wellness.  Maintain regular health, dental, and eye exams.  Eat a healthy diet. Foods like vegetables, fruits, whole grains, low-fat dairy products, and lean protein foods contain the nutrients you need and are low in calories. Decrease your intake of foods high in solid fats, added sugars, and salt. Get information about a proper diet from your health care provider, if necessary.  Regular physical exercise is one of the most important things you can do for your health. Most adults should get at least 150 minutes of moderate-intensity exercise (any activity that increases your heart rate and causes you to sweat) each week. In addition, most adults need muscle-strengthening exercises on 2 or more days a week.   Maintain a healthy weight. The body mass index (BMI) is a screening tool to identify possible weight problems. It provides an estimate of body fat based on height and weight. Your health care provider can find your BMI and can help you achieve or maintain a healthy weight. For males 20 years and older:  A BMI below 18.5 is considered underweight.  A BMI of 18.5 to 24.9 is normal.  A BMI of 25 to 29.9 is considered  overweight.  A BMI of 30 and above is considered obese.  Maintain normal blood lipids and cholesterol by exercising and minimizing your intake of saturated fat. Eat a balanced diet with plenty of fruits and vegetables. Blood tests for lipids and cholesterol should begin at age 24 and be repeated every 5 years. If your lipid or cholesterol levels are high, you are over age 22, or you are at high risk for heart disease, you may need your cholesterol levels checked more frequently.Ongoing high lipid and cholesterol levels should be treated with medicines if diet and exercise are not working.  If you smoke, find out from your health care provider how to quit. If you do not use tobacco, do not start.  Lung cancer screening is recommended for adults aged 55-80 years who are at high risk for developing lung cancer because of a history of smoking. A yearly low-dose CT scan of the lungs is recommended for people who have at least a 30-pack-year history of smoking and are current smokers or have quit within the past 15 years. A pack year of smoking is smoking an average of 1 pack of cigarettes a day for 1 year (for example, a 30-pack-year history of smoking could mean smoking 1 pack a day for 30 years or 2 packs a day for 15 years). Yearly screening should continue until the smoker  has stopped smoking for at least 15 years. Yearly screening should be stopped for people who develop a health problem that would prevent them from having lung cancer treatment.  If you choose to drink alcohol, do not have more than 2 drinks per day. One drink is considered to be 12 oz (360 mL) of beer, 5 oz (150 mL) of wine, or 1.5 oz (45 mL) of liquor.  Avoid the use of street drugs. Do not share needles with anyone. Ask for help if you need support or instructions about stopping the use of drugs.  High blood pressure causes heart disease and increases the risk of stroke. High blood pressure is more likely to develop in:  People  who have blood pressure in the end of the normal range (100-139/85-89 mm Hg).  People who are overweight or obese.  People who are African American.  If you are 85-53 years of age, have your blood pressure checked every 3-5 years. If you are 81 years of age or older, have your blood pressure checked every year. You should have your blood pressure measured twice--once when you are at a hospital or clinic, and once when you are not at a hospital or clinic. Record the average of the two measurements. To check your blood pressure when you are not at a hospital or clinic, you can use:  An automated blood pressure machine at a pharmacy.  A home blood pressure monitor.  If you are 57-40 years old, ask your health care provider if you should take aspirin to prevent heart disease.  Diabetes screening involves taking a blood sample to check your fasting blood sugar level. This should be done once every 3 years after age 48 if you are at a normal weight and without risk factors for diabetes. Testing should be considered at a younger age or be carried out more frequently if you are overweight and have at least 1 risk factor for diabetes.  Colorectal cancer can be detected and often prevented. Most routine colorectal cancer screening begins at the age of 24 and continues through age 53. However, your health care provider may recommend screening at an earlier age if you have risk factors for colon cancer. On a yearly basis, your health care provider may provide home test kits to check for hidden blood in the stool. A small camera at the end of a tube may be used to directly examine the colon (sigmoidoscopy or colonoscopy) to detect the earliest forms of colorectal cancer. Talk to your health care provider about this at age 68 when routine screening begins. A direct exam of the colon should be repeated every 5-10 years through age 59, unless early forms of precancerous polyps or small growths are found.  People  who are at an increased risk for hepatitis B should be screened for this virus. You are considered at high risk for hepatitis B if:  You were born in a country where hepatitis B occurs often. Talk with your health care provider about which countries are considered high risk.  Your parents were born in a high-risk country and you have not received a shot to protect against hepatitis B (hepatitis B vaccine).  You have HIV or AIDS.  You use needles to inject street drugs.  You live with, or have sex with, someone who has hepatitis B.  You are a man who has sex with other men (MSM).  You get hemodialysis treatment.  You take certain medicines for conditions like cancer,  organ transplantation, and autoimmune conditions.  Hepatitis C blood testing is recommended for all people born from [birth year] through 1963 and any individual with known risk factors for hepatitis C.  Healthy men should no longer receive prostate-specific antigen (PSA) blood tests as part of routine cancer screening. Talk to your health care provider about prostate cancer screening.  Testicular cancer screening is not recommended for adolescents or adult males who have no symptoms. Screening includes self-exam, a health care provider exam, and other screening tests. Consult with your health care provider about any symptoms you have or any concerns you have about testicular cancer.  Practice safe sex. Use condoms and avoid high-risk sexual practices to reduce the spread of sexually transmitted infections (STIs).  You should be screened for STIs, including gonorrhea and chlamydia if:  You are sexually active and are younger than 24 years.  You are older than 24 years, and your health care provider tells you that you are at risk for this type of infection.  Your sexual activity has changed since you were last screened, and you are at an increased risk for chlamydia or gonorrhea. Ask your health care provider if you are at  risk.  If you are at risk of being infected with HIV, it is recommended that you take a prescription medicine daily to prevent HIV infection. This is called pre-exposure prophylaxis (PrEP). You are considered at risk if:  You are a man who has sex with other men (MSM).  You are a heterosexual man who is sexually active with multiple partners.  You take drugs by injection.  You are sexually active with a partner who has HIV.  Talk with your health care provider about whether you are at high risk of being infected with HIV. If you choose to begin PrEP, you should first be tested for HIV. You should then be tested every 3 months for as long as you are taking PrEP.  Use sunscreen. Apply sunscreen liberally and repeatedly throughout the day. You should seek shade when your shadow is shorter than you. Protect yourself by wearing long sleeves, pants, a wide-brimmed hat, and sunglasses year round whenever you are outdoors.  Tell your health care provider of new moles or changes in moles, especially if there is a change in shape or color. Also, tell your health care provider if a mole is larger than the size of a pencil eraser.  A one-time screening for abdominal aortic aneurysm (AAA) and surgical repair of large AAAs by ultrasound is recommended for men aged 65-75 years who are current or former smokers.  Stay current with your vaccines (immunizations).   This information is not intended to replace advice given to you by your health care provider. Make sure you discuss any questions you have with your health care provider.   Document Released: 10/08/2007 Document Revised: 05/02/2014 Document Reviewed: 09/06/2010 Elsevier Interactive Patient Education [employer]

## 2015-05-18 ENCOUNTER — Encounter: Payer: Self-pay | Admitting: Emergency Medicine

## 2015-09-03 ENCOUNTER — Other Ambulatory Visit: Payer: Self-pay | Admitting: *Deleted

## 2015-09-03 MED ORDER — METOPROLOL TARTRATE 25 MG PO TABS: 25.0000 mg | ORAL_TABLET | Freq: Two times a day (BID) | ORAL | Status: DC

## 2015-09-03 NOTE — Telephone Encounter (Signed)
Received call pt states he is needing refill on his Metoprolol 25 mg. [facility]...Raechel Chute/lmb

## 2016-01-27 ENCOUNTER — Other Ambulatory Visit: Payer: Self-pay | Admitting: *Deleted

## 2016-01-27 ENCOUNTER — Telehealth: Payer: Self-pay | Admitting: Physician Assistant

## 2016-01-27 MED ORDER — NITROGLYCERIN 0.4 MG SL SUBL: 0.4000 mg | SUBLINGUAL_TABLET | SUBLINGUAL | 0 refills | Status: DC | PRN

## 2016-01-27 NOTE — Telephone Encounter (Signed)
°*  STAT* If patient is at the pharmacy, call can be transferred to refill team.   1. Which medications need to be refilled? (please list name of each medication and dose if known) Nitrostat 0.4mg  needs a new rx sent   2. Which pharmacy/location (including street and city if local pharmacy) is medication to be sent to?[facility] [place] [phone] fax [phone])  3. Do they need a 30 day or 90 day supply? 30

## 2016-01-27 NOTE — Telephone Encounter (Signed)
Rx sent to pharmacy   

## 2016-02-06 ENCOUNTER — Other Ambulatory Visit: Payer: Self-pay | Admitting: Physician Assistant

## 2016-04-20 ENCOUNTER — Encounter: Payer: Self-pay | Admitting: *Deleted

## 2016-04-22 ENCOUNTER — Other Ambulatory Visit: Payer: Self-pay | Admitting: Pharmacist

## 2016-04-22 NOTE — Patient Outreach (Signed)
Outreach call to Wyatt Mason regarding medication adherence to atorvastatin. Left a HIPAA compliant message for the patient.  Duanne MoronElisabeth Nyx Keady, PharmD, [facility] Center[facility]ACP Clinical Pharmacist [facility] Care Management [phone]

## 2016-04-22 NOTE — Patient Outreach (Signed)
Receive a call back from Wyatt Mason regarding adherence to his atorvastatin. Called and spoke with patient. HIPAA identifiers verified and verbal consent received.  Wyatt Mason reports that he takes his atorvastatin, as directed. Reports that he believes that he misses a dose or two of his medication maybe once each week. Discuss with patient a variety of strategies for helping him to remember to take his medications such as using a pillbox, timing of taking the medications and placing his medications where he will see them to help him to remember. Discuss with patient the importance of medication adherence. Patient verbalizes understanding. Patient denies any issues with medication side effects or cost.   Duanne MoronElisabeth Dyshon Philbin, PharmD, [facility]BCACP Clinical Pharmacist [facility] Care Management [phone]

## 2016-05-11 ENCOUNTER — Ambulatory Visit: Payer: Medicare Other | Admitting: Cardiovascular Disease

## 2016-05-13 ENCOUNTER — Encounter: Payer: Self-pay | Admitting: Cardiovascular Disease

## 2016-05-13 ENCOUNTER — Ambulatory Visit (INDEPENDENT_AMBULATORY_CARE_PROVIDER_SITE_OTHER): Payer: Medicare Other | Admitting: Cardiovascular Disease

## 2016-05-13 VITALS — BP 154/90 | HR 77 | Ht 73.0 in | Wt 259.8 lb

## 2016-05-13 DIAGNOSIS — I10 Essential (primary) hypertension: Secondary | ICD-10-CM

## 2016-05-13 DIAGNOSIS — I2510 Atherosclerotic heart disease of native coronary artery without angina pectoris: Secondary | ICD-10-CM | POA: Diagnosis not present

## 2016-05-13 DIAGNOSIS — E785 Hyperlipidemia, unspecified: Secondary | ICD-10-CM | POA: Diagnosis not present

## 2016-05-13 NOTE — Progress Notes (Signed)
Cardiology Office Note Date:  05/13/2016   ID:  Wyatt Mason, DOB 06-24-1932, MRN [id number]  PCP:  Binnie Rail, MD  Cardiologist:  Sherren Mocha, MD    Chief Complaint  Patient presents with  . Coronary Artery Disease    History of Present Illness: Wyatt Mason is a 83 y.o. male who presents for follow-up evaluation. The patient is followed for coronary artery disease and initially presented with a non-ST elevation infarction in December 2014. He was found to have moderate stenosis in the LAD and right coronary artery and critical stenosis in the left circumflex distribution. He was treated with drug-eluting stents in the obtuse marginal branches of the left circumflex. Medical therapy was recommended for his residual disease.  The patient is here alone today. He is doing well. Heart palpitations, orthopnea, or PND. He is compliant with his medications. He stopped Brilinta when he was seen last year. He denies symptoms of chest pain, shortness of breath, leg swelling. Overall he is doing quite well from a cardiac perspective.  Past Medical History:  Diagnosis Date  . CAD (coronary artery disease) 04/15/2013   a. s/p DES x 2 to mid OM, residual RCA and LAD borderline CAD. b. Nuc 12/2014 - low risk.  Marland Kitchen Hyperglycemia   . Hyperlipidemia   . Hypertension   . Obesity   . Shingles 2005    Past Surgical History:  Procedure Laterality Date  . APPENDECTOMY    . CORONARY ANGIOPLASTY WITH STENT PLACEMENT  04/15/2013   sequential 90% then 95% mid OM lesions s/p DES x 2, residual 50% prox LAD and 50-70% mid RCA stenoses; LV gram deferred  . LEFT HEART CATHETERIZATION WITH CORONARY ANGIOGRAM N/A 04/15/2013   Procedure: LEFT HEART CATHETERIZATION WITH CORONARY ANGIOGRAM;  Surgeon: Blane Ohara, MD;  Location: [facility] CATH LAB;  Service: Cardiovascular;  Laterality: N/A;  . no colonoscopy     SOC reviewed 04/29/14  . TOTAL HIP ARTHROPLASTY Right 2005    Current Outpatient  Prescriptions  Medication Sig Dispense Refill  . aspirin EC 81 MG tablet Take 81 mg by mouth daily.    Marland Kitchen atorvastatin (LIPITOR) 80 MG tablet Take 1 tablet (80 mg total) by mouth daily. 90 tablet 3  . Cholecalciferol (VITAMIN D-3) 1000 UNITS CAPS Take 1 capsule by mouth daily.     . isosorbide mononitrate (IMDUR) 30 MG 24 hr tablet take 1 tablet by mouth once daily 30 tablet 2  . metoprolol tartrate (LOPRESSOR) 25 MG tablet Take 1 tablet (25 mg total) by mouth 2 (two) times daily. 180 tablet 2  . Multiple Vitamin (MULTIVITAMIN WITH MINERALS) TABS tablet Take 1 tablet by mouth daily.    . nitroGLYCERIN (NITROSTAT) 0.4 MG SL tablet Place 1 tablet (0.4 mg total) under the tongue every 5 (five) minutes x 3 doses as needed for chest pain. 25 tablet 0   No current facility-administered medications for this visit.     Allergies:   Other   Social History:  The patient  reports that he has never smoked. He has never used smokeless tobacco. He reports that he drinks about 1.2 oz of alcohol per week . He reports that he does not use drugs.   Family History:  The patient's  family history includes Heart attack in his father.    ROS:  Please see the history of present illness.  Otherwise, review of systems is positive for hearing loss, cough.  All other systems are reviewed and negative.  PHYSICAL EXAM: VS:  BP (!) 154/90 (BP Location: Left Arm, Patient Position: Sitting, Cuff Size: Large)   Pulse 77   Ht _0  (1.854 m)   Wt 259 lb 12.8 oz (117.8 kg)   SpO2 94%   BMI 34.28 kg/m  , BMI Body mass index is 34.28 kg/m. GEN: Well nourished, well developed, in no acute distress  HEENT: normal  Neck: no JVD, no masses. No carotid bruits Cardiac: RRR without murmur or gallop                Respiratory:  clear to auscultation bilaterally, normal work of breathing GI: soft, nontender, nondistended, + BS MS: no deformity or atrophy  Ext: no pretibial edema, pedal pulses 2+= bilaterally Skin: warm  and dry, no rash Neuro:  Strength and sensation are intact Psych: euthymic mood, full affect  EKG:  EKG is not ordered today. The ekg ordered today shows NSR 77 bpm, within normal limits  Recent Labs: No results found for requested labs within last 8760 hours.   Lipid Panel     Component Value Date/Time   CHOL 131 04/08/2015 0801   TRIG 64 04/08/2015 0801   HDL 72 04/08/2015 0801   CHOLHDL 1.8 04/08/2015 0801   VLDL 13 04/08/2015 0801   LDLCALC 46 04/08/2015 0801      Wt Readings from Last 3 Encounters:  05/13/16 259 lb 12.8 oz (117.8 kg)  05/12/15 257 lb (116.6 kg)  04/08/15 253 lb 6.4 oz (114.9 kg)     ASSESSMENT AND PLAN: 1.  Coronary artery disease, native vessel, without symptoms of angina: The patient's medical program is reviewed and will be continued without changes.  2. Hypertension, uncontrolled: The patient did not take his medications this morning. He has not been regularly monitoring his blood pressure. We discussed lifestyle modification and I asked him to keep a blood pressure log at least 2 days per week and call and if his readings are greater than 140/90.  3. Hyperlipidemia: Most recent lipids reviewed as above. Lipids have been at goal. Will update labs this morning as the patient is fasting with a metabolic panel and lipid panel.  Current medicines are reviewed with the patient today.  The patient does not have concerns regarding medicines.  Labs/ tests ordered today include:   Orders Placed This Encounter  Procedures  . Lipid panel  . Comp Met (CMET)  . EKG 12-Lead    Disposition:   FU one year  Signed, Sherren Mocha, MD  05/13/2016 1:45 PM    [facility] [place], [place], [place]  [address] Phone: [phone]; Fax: [phone]

## 2016-05-13 NOTE — Patient Instructions (Addendum)
Medication Instructions:  Your physician recommends that you continue on your current medications as directed. Please refer to the Current Medication list given to you today.  Labwork: Your physician recommends that you return for a FASTING LIPID and CMP-nothing to eat or drink after midnight, lab opens at 7:30 AM  Testing/Procedures: No new orders.   Follow-Up: Your physician has requested that you regularly monitor and record your blood pressure readings at home. Please use the same machine at the same time of day to check your readings and record them to bring to your follow-up visit. Please check your BP 2-3 times per week. Please contact the office at [phone] with your BP readings after 2-3 weeks.   Your physician wants you to follow-up in: 1 YEAR with Dr Excell Seltzerooper.  You will receive a reminder letter in the mail two months in advance. If you don't receive a letter, please call our office to schedule the follow-up appointment.  Any Other Special Instructions Will Be Listed Below (If Applicable).  Dr Tana ConchStephen Hunter, Primary Care [phone]    If you need a refill on your cardiac medications before your next appointment, please call your pharmacy.

## 2016-05-14 LAB — LIPID PANEL
CHOL/HDL RATIO: 2.1 ratio (ref 0.0–5.0)
CHOLESTEROL TOTAL: 171 mg/dL (ref 100–199)
HDL: 80 mg/dL (ref >39)
LDL Calculated: 82 mg/dL (ref 0–99)
Triglycerides: 46 mg/dL (ref 0–149)
VLDL Cholesterol Cal: 9 mg/dL (ref 5–40)

## 2016-05-14 LAB — COMPREHENSIVE METABOLIC PANEL
ALT: 29 IU/L (ref 0–44)
AST: 24 IU/L (ref 0–40)
Albumin/Globulin Ratio: 2.1 (ref 1.2–2.2)
Albumin: 4.4 g/dL (ref 3.5–4.7)
Alkaline Phosphatase: 73 IU/L (ref 39–117)
BUN/Creatinine Ratio: 16 (ref 10–24)
BUN: 12 mg/dL (ref 8–27)
Bilirubin Total: 0.7 mg/dL (ref 0.0–1.2)
CALCIUM: 8.8 mg/dL (ref 8.6–10.2)
CO2: 28 mmol/L (ref 18–29)
CREATININE: 0.75 mg/dL — AB (ref 0.76–1.27)
Chloride: 100 mmol/L (ref 96–106)
GFR calc Af Amer: 99 mL/min/{1.73_m2} (ref >59)
GFR, EST NON AFRICAN AMERICAN: 86 mL/min/{1.73_m2} (ref >59)
Globulin, Total: 2.1 g/dL (ref 1.5–4.5)
Glucose: 103 mg/dL — ABNORMAL HIGH (ref 65–99)
Potassium: 4.5 mmol/L (ref 3.5–5.2)
Sodium: 144 mmol/L (ref 134–144)
Total Protein: 6.5 g/dL (ref 6.0–8.5)

## 2016-05-15 ENCOUNTER — Other Ambulatory Visit: Payer: Self-pay | Admitting: Physician Assistant

## 2016-05-18 ENCOUNTER — Other Ambulatory Visit: Payer: Self-pay | Admitting: Physician Assistant

## 2016-05-18 NOTE — Telephone Encounter (Signed)
Rx refill sent to pharmacy. 

## 2016-08-13 ENCOUNTER — Other Ambulatory Visit: Payer: Self-pay | Admitting: Internal Medicine

## 2016-08-29 ENCOUNTER — Other Ambulatory Visit: Payer: Self-pay | Admitting: Internal Medicine

## 2016-12-26 ENCOUNTER — Other Ambulatory Visit: Payer: Self-pay | Admitting: Internal Medicine

## 2016-12-31 ENCOUNTER — Other Ambulatory Visit: Payer: Self-pay | Admitting: Cardiovascular Disease

## 2017-05-19 ENCOUNTER — Other Ambulatory Visit: Payer: Self-pay | Admitting: Physician Assistant

## 2017-05-19 ENCOUNTER — Other Ambulatory Visit: Payer: Self-pay | Admitting: Internal Medicine

## 2017-05-23 ENCOUNTER — Other Ambulatory Visit: Payer: Self-pay | Admitting: Internal Medicine

## 2017-06-09 ENCOUNTER — Encounter: Payer: Self-pay | Admitting: Cardiovascular Disease

## 2017-06-09 ENCOUNTER — Ambulatory Visit: Payer: Medicare Other | Admitting: Cardiovascular Disease

## 2017-06-09 ENCOUNTER — Encounter (INDEPENDENT_AMBULATORY_CARE_PROVIDER_SITE_OTHER): Payer: Self-pay

## 2017-06-09 VITALS — BP 184/96 | HR 85 | Ht 73.0 in | Wt 264.8 lb

## 2017-06-09 DIAGNOSIS — I2510 Atherosclerotic heart disease of native coronary artery without angina pectoris: Secondary | ICD-10-CM

## 2017-06-09 DIAGNOSIS — E782 Mixed hyperlipidemia: Secondary | ICD-10-CM | POA: Diagnosis not present

## 2017-06-09 DIAGNOSIS — I10 Essential (primary) hypertension: Secondary | ICD-10-CM | POA: Diagnosis not present

## 2017-06-09 MED ORDER — METOPROLOL TARTRATE 50 MG PO TABS: 50.0000 mg | ORAL_TABLET | Freq: Two times a day (BID) | ORAL | 3 refills | Status: DC

## 2017-06-09 MED ORDER — ATORVASTATIN CALCIUM 80 MG PO TABS: 80.0000 mg | ORAL_TABLET | Freq: Every day | ORAL | 3 refills | Status: DC

## 2017-06-09 MED ORDER — AMLODIPINE BESYLATE 5 MG PO TABS: 5.0000 mg | ORAL_TABLET | Freq: Every day | ORAL | 3 refills | Status: DC

## 2017-06-09 MED ORDER — ISOSORBIDE MONONITRATE ER 30 MG PO TB24: 30.0000 mg | ORAL_TABLET | Freq: Every day | ORAL | 3 refills | Status: DC

## 2017-06-09 NOTE — Progress Notes (Signed)
Cardiology Office Note Date:  06/09/2017   ID:  Armandina GemmaCharles R Vanderweele, DOB 1932-07-31, MRN [id number]  PCP:  Shelva MajesticHunter, Stephen O, MD  Cardiologist:  Tonny BollmanMichael Andersson [place], MD    Chief Complaint  Patient presents with  . Follow-up    CAD    History of Present Illness: Wyatt AusCharles R Mason is a 84 y.o. male who presents for  follow-up evaluation. The patient is followed for coronary artery disease and initially presented with a non-ST elevation infarction in 2014. He was found to have moderate stenosis in the LAD and right coronary artery and critical stenosis in the left circumflex distribution. He was treated with drug-eluting stents in the obtuse marginal branches of the left circumflex. Medical therapy was recommended for his residual disease.  The patient is here alone today. He feels fine other than 'getting old.' Today, he denies symptoms of palpitations, chest pain, shortness of breath, orthopnea, PND, lower extremity edema, dizziness, or syncope.  He's not exercising or following a diet. Says he has a good appetite. Notes BP is running high most of the time with SBP 140-180 mmHg at home.  Past Medical History:  Diagnosis Date  . CAD (coronary artery disease) 04/15/2013   a. s/p DES x 2 to mid OM, residual RCA and LAD borderline CAD. b. Nuc 12/2014 - low risk.  Marland Kitchen. Hyperglycemia   . Hyperlipidemia   . Hypertension   . Obesity   . Shingles 2005    Past Surgical History:  Procedure Laterality Date  . APPENDECTOMY    . CORONARY ANGIOPLASTY WITH STENT PLACEMENT  04/15/2013   sequential 90% then 95% mid OM lesions s/p DES x 2, residual 50% prox LAD and 50-70% mid RCA stenoses; LV gram deferred  . LEFT HEART CATHETERIZATION WITH CORONARY ANGIOGRAM N/A 04/15/2013   Procedure: LEFT HEART CATHETERIZATION WITH CORONARY ANGIOGRAM;  Surgeon: Micheline ChapmanMichael D Wendle Kina, MD;  [facility];  Service: Cardiovascular;  Laterality: N/A;  . no colonoscopy     SOC reviewed 04/29/14  . TOTAL HIP ARTHROPLASTY Right  2005    Current Outpatient Medications  Medication Sig Dispense Refill  . aspirin EC 81 MG tablet Take 81 mg by mouth daily.    . Cholecalciferol (VITAMIN D-3) 1000 UNITS CAPS Take 1 capsule by mouth daily.     . Multiple Vitamin (MULTIVITAMIN WITH MINERALS) TABS tablet Take 1 tablet by mouth daily.    . nitroGLYCERIN (NITROSTAT) 0.4 MG SL tablet place 1 tablet under the tongue if needed every 5 minutes for 3 doses if needed for chest pain 25 tablet 3  . amLODipine (NORVASC) 5 MG tablet Take 1 tablet (5 mg total) by mouth daily. 90 tablet 3  . atorvastatin (LIPITOR) 80 MG tablet Take 1 tablet (80 mg total) by mouth daily. 90 tablet 3  . isosorbide mononitrate (IMDUR) 30 MG 24 hr tablet Take 1 tablet (30 mg total) by mouth daily. 90 tablet 3  . metoprolol tartrate (LOPRESSOR) 50 MG tablet Take 1 tablet (50 mg total) by mouth 2 (two) times daily. 180 tablet 3   No current facility-administered medications for this visit.     Allergies:   Other   Social History:  The patient  reports that  has never smoked. he has never used smokeless tobacco. He reports that he drinks about 1.2 oz of alcohol per week. He reports that he does not use drugs.   Family History:  The patient's family history includes Heart attack in his father.  ROS:  Please see the history of present illness.   All other systems are reviewed and negative.    PHYSICAL EXAM: VS:  BP (!) 184/96   Pulse 85   Ht 6\' 1"  (1.854 m)   Wt 264 lb 12.8 oz (120.1 kg)   BMI 34.94 kg/m  , BMI Body mass index is 34.94 kg/m. GEN: Well nourished, well developed, pleasant elderly male in no acute distress  HEENT: normal  Neck: no JVD, no masses. No carotid bruits Cardiac: RRR without murmur or gallop     Respiratory:  clear to auscultation bilaterally, normal work of breathing GI: soft, nontender, nondistended, + BS MS: no deformity or atrophy  Ext: no pretibial edema, pedal pulses 2+= bilaterally Skin: warm and dry, no  rash Neuro:  Strength and sensation are intact Psych: euthymic mood, full affect  EKG:  EKG is ordered today. The ekg ordered today shows NSR 85 bpm, within normal limits  Recent Labs: No results found for requested labs within last 8760 hours.   Lipid Panel     Component Value Date/Time   CHOL 171 05/13/2016 1145   TRIG 46 05/13/2016 1145   HDL 80 05/13/2016 1145   CHOLHDL 2.1 05/13/2016 1145   CHOLHDL 1.8 04/08/2015 0801   VLDL 13 04/08/2015 0801   LDLCALC 82 05/13/2016 1145      Wt Readings from Last 3 Encounters:  06/09/17 264 lb 12.8 oz (120.1 kg)  05/13/16 259 lb 12.8 oz (117.8 kg)  05/12/15 257 lb (116.6 kg)     Cardiac Studies Reviewed: Echocardiogram May 01, 2013: Left ventricle: The cavity size was normal. Wall thickness was increased in a pattern of mild LVH. Systolic function was normal. The estimated ejection fraction was in the range of 55% to 60%. Regional wall motion abnormalities cannot be excluded. Doppler parameters are consistent with abnormal left ventricular relaxation (grade 1 diastolic dysfunction).  ------------------------------------------------------------ Aortic valve:  Trileaflet; mildly thickened leaflets. Mobility was not restricted. Doppler: Transvalvular velocity was within the normal range. There was no stenosis. No regurgitation.  ------------------------------------------------------------ Aorta: Aortic root: The aortic root was mildly dilated.  ------------------------------------------------------------ Mitral valve:  Structurally normal valve.  Mobility was not restricted. Doppler: Transvalvular velocity was within the normal range. There was no evidence for stenosis. No regurgitation.  ------------------------------------------------------------ Left atrium: The atrium was mildly dilated.  ------------------------------------------------------------ Right ventricle: The cavity size was normal.  Systolic function was normal.  ------------------------------------------------------------ Pulmonic valve:  Doppler: Transvalvular velocity was within the normal range. There was no evidence for stenosis.  ------------------------------------------------------------ Tricuspid valve:  Structurally normal valve.  Doppler: Transvalvular velocity was within the normal range. Trivial regurgitation.  ------------------------------------------------------------ Right atrium: The atrium was mildly dilated.  ------------------------------------------------------------ Pericardium: There was no pericardial effusion.  ASSESSMENT AND PLAN: 1.  Coronary artery disease, native vessel, without symptoms of angina: The patient is doing well on his current medical program.  See below for medication changes related to his blood pressure.  He continues on aspirin and a high intensity statin drug.  2.  Hypertension: Blood pressure is uncontrolled.  We discussed lifestyle modifications, the importance of diet with weight loss, and initiation of an exercise program.  He understands that relatively modest weight loss can significantly improve blood pressure.  I asked him to increase metoprolol to 50 mg twice daily and add amlodipine 5 mg daily.  I asked him to log his blood pressures and bring them in for a follow-up blood pressure visit in 6-8 weeks.  3.  Hyperlipidemia: Last  year his lipids are reviewed with a cholesterol 171, HDL 80, and LDL 82.  Triglycerides were 46.  The patient is treated with a high intensity statin drug.  Labs are drawn today.  Current medicines are reviewed with the patient today.  The patient does not have concerns regarding medicines.  Labs/ tests ordered today include:   Orders Placed This Encounter  Procedures  . CBC with Differential/Platelet  . Comprehensive metabolic panel  . Lipid panel  . EKG 12-Lead    Disposition:   FU PharmD HTN Clinic 6-8 weeks, FU  with me one year  Signed, Tonny Bollman, MD  06/09/2017 1:41 PM    [facility] [address] [place], [place], [place]  [address] Phone: [phone]; Fax: [phone]

## 2017-06-09 NOTE — Patient Instructions (Signed)
Medication Instructions:  1) INCREASE METOPROLOL to 50 mg twice daily 2) START AMLODIPINE (Norvasc) 5 mg daily  Labwork: TODAY: CMET, CBC, Lipids  Testing/Procedures: None  Follow-Up: Your provider wants you to follow-up in: 1 year with Dr. Excell Seltzerooper. You will receive a reminder letter in the mail two months in advance. If you don't receive a letter, please call our office to schedule the follow-up appointment.    Any Other Special Instructions Will Be Listed Below (If Applicable).     If you need a refill on your cardiac medications before your next appointment, please call your pharmacy.

## 2017-06-10 LAB — COMPREHENSIVE METABOLIC PANEL
A/G RATIO: 1.9 (ref 1.2–2.2)
ALBUMIN: 4.4 g/dL (ref 3.5–4.7)
ALK PHOS: 78 IU/L (ref 39–117)
ALT: 23 IU/L (ref 0–44)
AST: 24 IU/L (ref 0–40)
BILIRUBIN TOTAL: 0.4 mg/dL (ref 0.0–1.2)
BUN/Creatinine Ratio: 22 (ref 10–24)
BUN: 17 mg/dL (ref 8–27)
CHLORIDE: 100 mmol/L (ref 96–106)
CO2: 25 mmol/L (ref 20–29)
Calcium: 8.9 mg/dL (ref 8.6–10.2)
Creatinine, Ser: 0.79 mg/dL (ref 0.76–1.27)
GFR, EST AFRICAN AMERICAN: 97 mL/min/{1.73_m2} (ref >59)
GFR, EST NON AFRICAN AMERICAN: 84 mL/min/{1.73_m2} (ref >59)
GLOBULIN, TOTAL: 2.3 g/dL (ref 1.5–4.5)
Glucose: 101 mg/dL — ABNORMAL HIGH (ref 65–99)
POTASSIUM: 4.4 mmol/L (ref 3.5–5.2)
Sodium: 140 mmol/L (ref 134–144)
Total Protein: 6.7 g/dL (ref 6.0–8.5)

## 2017-06-10 LAB — CBC WITH DIFFERENTIAL/PLATELET
BASOS ABS: 0.1 10*3/uL (ref 0.0–0.2)
Basos: 1 %
EOS (ABSOLUTE): 0.3 10*3/uL (ref 0.0–0.4)
Eos: 5 %
HEMATOCRIT: 39.9 % (ref 37.5–51.0)
HEMOGLOBIN: 13.6 g/dL (ref 13.0–17.7)
Immature Grans (Abs): 0 10*3/uL (ref 0.0–0.1)
Immature Granulocytes: 0 %
LYMPHS ABS: 1.7 10*3/uL (ref 0.7–3.1)
Lymphs: 32 %
MCH: 31.4 pg (ref 26.6–33.0)
MCHC: 34.1 g/dL (ref 31.5–35.7)
MCV: 92 fL (ref 79–97)
Monocytes Absolute: 0.5 10*3/uL (ref 0.1–0.9)
Monocytes: 9 %
NEUTROS ABS: 2.7 10*3/uL (ref 1.4–7.0)
Neutrophils: 53 %
Platelets: 194 10*3/uL (ref 150–379)
RBC: 4.33 x10E6/uL (ref 4.14–5.80)
RDW: 13.1 % (ref 12.3–15.4)
WBC: 5.2 10*3/uL (ref 3.4–10.8)

## 2017-06-10 LAB — LIPID PANEL
CHOL/HDL RATIO: 2.8 ratio (ref 0.0–5.0)
Cholesterol, Total: 203 mg/dL — ABNORMAL HIGH (ref 100–199)
HDL: 72 mg/dL (ref >39)
LDL Calculated: 117 mg/dL — ABNORMAL HIGH (ref 0–99)
TRIGLYCERIDES: 68 mg/dL (ref 0–149)
VLDL CHOLESTEROL CAL: 14 mg/dL (ref 5–40)

## 2017-06-14 DIAGNOSIS — H11441 Conjunctival cysts, right eye: Secondary | ICD-10-CM | POA: Diagnosis not present

## 2017-06-14 DIAGNOSIS — Z961 Presence of intraocular lens: Secondary | ICD-10-CM | POA: Diagnosis not present

## 2017-06-14 DIAGNOSIS — Z9842 Cataract extraction status, left eye: Secondary | ICD-10-CM | POA: Diagnosis not present

## 2017-06-14 DIAGNOSIS — Z9841 Cataract extraction status, right eye: Secondary | ICD-10-CM | POA: Diagnosis not present

## 2017-06-14 DIAGNOSIS — H5712 Ocular pain, left eye: Secondary | ICD-10-CM | POA: Diagnosis not present

## 2017-06-15 ENCOUNTER — Telehealth: Payer: Self-pay | Admitting: Cardiovascular Disease

## 2017-06-15 DIAGNOSIS — E782 Mixed hyperlipidemia: Secondary | ICD-10-CM

## 2017-06-15 MED ORDER — EZETIMIBE 10 MG PO TABS: 10.0000 mg | ORAL_TABLET | Freq: Every day | ORAL | 3 refills | Status: DC

## 2017-06-15 NOTE — Telephone Encounter (Signed)
Follow Up: ° ° °Returning your call,concerning his lab results. °

## 2017-06-15 NOTE — Telephone Encounter (Signed)
Spoke with patient's wife. Explained to her that results cannot be reviewed as DPR form was not correctly completed. Will call back later this afternoon when patient returns home to review results.

## 2017-06-15 NOTE — Telephone Encounter (Signed)
-----   Message from Tonny BollmanMichael Cooper, MD sent at 06/14/2017 11:07 PM EST ----- Lipids higher than past readings. Please confirm that he is regularly taking atorvastatin 80 mg daily. If he is, would add zetia 10 mg daily and repeat labs in 3 months. thx

## 2017-06-15 NOTE — Telephone Encounter (Signed)
Informed patient of results and verbal understanding expressed.   Confirmed with patient he is taking medications as directed and eating like he should. Instructed patient to START ZETIA 10 mg daily. Repeat FLP and LFTs scheduled 5/22. Patient agrees with treatment plan.

## 2017-06-16 ENCOUNTER — Other Ambulatory Visit: Payer: Medicare Other

## 2017-07-05 DIAGNOSIS — Z9841 Cataract extraction status, right eye: Secondary | ICD-10-CM | POA: Diagnosis not present

## 2017-07-05 DIAGNOSIS — H02403 Unspecified ptosis of bilateral eyelids: Secondary | ICD-10-CM | POA: Diagnosis not present

## 2017-07-05 DIAGNOSIS — H11441 Conjunctival cysts, right eye: Secondary | ICD-10-CM | POA: Diagnosis not present

## 2017-07-05 DIAGNOSIS — Z961 Presence of intraocular lens: Secondary | ICD-10-CM | POA: Diagnosis not present

## 2017-07-05 DIAGNOSIS — Z9842 Cataract extraction status, left eye: Secondary | ICD-10-CM | POA: Diagnosis not present

## 2017-07-21 ENCOUNTER — Ambulatory Visit: Payer: Medicare Other

## 2017-07-27 ENCOUNTER — Ambulatory Visit (INDEPENDENT_AMBULATORY_CARE_PROVIDER_SITE_OTHER): Payer: Medicare Other | Admitting: Pharmacist

## 2017-07-27 ENCOUNTER — Encounter: Payer: Self-pay | Admitting: Pharmacist

## 2017-07-27 VITALS — BP 140/72 | HR 65

## 2017-07-27 DIAGNOSIS — I10 Essential (primary) hypertension: Secondary | ICD-10-CM | POA: Diagnosis not present

## 2017-07-27 MED ORDER — NITROGLYCERIN 0.4 MG SL SUBL
SUBLINGUAL_TABLET | SUBLINGUAL | 3 refills | Status: DC
Start: 2017-07-27 — End: 2018-05-08

## 2017-07-27 NOTE — Progress Notes (Signed)
Patient ID: Wyatt AusCharles R Schaber                 DOB: [birth year]                      MRN: [id number]     HPI: Wyatt Mason is a 84 y.o. male patient of Dr. Excell Seltzerooper who presents today for hypertension evaluation. PMH significant for coronary artery disease and initially presented with a non-ST elevation infarction in 2014. He was found to have moderate stenosis in the LAD and right coronary artery and critical stenosis in the left circumflex distribution. He was treated with drug-eluting stents in the obtuse marginal branches of the left circumflex. Medical therapy was recommended for his residual disease. At his last visit with Dr. Excell Seltzerooper he was started on amlodipine 5mg  daily and his metoprolol was increased to 50mg  BID.   He presents today in good spirits for blood pressure management. He denies SOB, chest pain, dizziness, and headache. He tells me that he has just returned from time with [place] brother[place]in[place]law in [place] and states that "he will give you hypertension." He does bring a log of home measurements, but is missing a page (see below). He feels he is doing well on his current medications.   Current HTN meds:  Amlodipine 5mg  daily in the morning Isosorbide mononitrate 30mg  daily in the morning Metoprolol tartrate 50mg  BID  Previously tried: HCTZ  BP goal: <140/90 given age  Family History: Heart attack in his father.  Social History: The patient  reports that  has never smoked. he has never used smokeless tobacco. He reports that he drinks about 1.2 oz of alcohol per week. He reports that he does not use drugs.  Diet: Most meals from 75% from home. He does not use salt. He tries to eat a lot of vegetables. He drinks mostly water, hot tea and jack daniels. 1-4 cup of irish hot tea per day.   Exercise: admits not enough exercise.   Home BP readings: 140s mostly/70s with occasional 150s systolic  Wt Readings from Last 3 Encounters:  06/09/17 264 lb 12.8 oz (120.1 kg)  05/13/16  259 lb 12.8 oz (117.8 kg)  05/12/15 257 lb (116.6 kg)   BP Readings from Last 3 Encounters:  07/27/17 140/72  06/09/17 (!) 184/96  05/13/16 (!) 154/90   Pulse Readings from Last 3 Encounters:  07/27/17 65  06/09/17 85  05/13/16 77    Renal function: CrCl cannot be calculated (Patient's most recent lab result is older than the maximum 21 days allowed.).  Past Medical History:  Diagnosis Date  . CAD (coronary artery disease) 04/15/2013   a. s/p DES x 2 to mid OM, residual RCA and LAD borderline CAD. b. Nuc 12/2014 - low risk.  Marland Kitchen. Hyperglycemia   . Hyperlipidemia   . Hypertension   . Obesity   . Shingles 2005    Current Outpatient Medications on File Prior to Visit  Medication Sig Dispense Refill  . amLODipine (NORVASC) 5 MG tablet Take 1 tablet (5 mg total) by mouth daily. 90 tablet 3  . aspirin EC 81 MG tablet Take 81 mg by mouth daily.    Marland Kitchen. atorvastatin (LIPITOR) 80 MG tablet Take 1 tablet (80 mg total) by mouth daily. 90 tablet 3  . Cholecalciferol (VITAMIN D-3) 1000 UNITS CAPS Take 1 capsule by mouth daily.     Marland Kitchen. ezetimibe (ZETIA) 10 MG tablet Take 1 tablet (10 mg total) by  mouth daily. 90 tablet 3  . isosorbide mononitrate (IMDUR) 30 MG 24 hr tablet Take 1 tablet (30 mg total) by mouth daily. 90 tablet 3  . metoprolol tartrate (LOPRESSOR) 50 MG tablet Take 1 tablet (50 mg total) by mouth 2 (two) times daily. 180 tablet 3  . Multiple Vitamin (MULTIVITAMIN WITH MINERALS) TABS tablet Take 1 tablet by mouth daily.     No current facility-administered medications on file prior to visit.     Allergies  Allergen Reactions  . Other     Environmental allergies    Blood pressure 140/72, pulse 65.   Assessment/Plan: Hypertension: BP today is borderline at goal <140/90. Will continue current regimen. Have asked he continue to monitor and call if pressures run consistently in 150s-160s systolic or symptoms change. Follow up with Dr. Excell Seltzer as scheduled and HTN clinic as  needed.    Thank you, Freddie Apley. Cleatis Polka, PharmD  [facility]  07/27/2017 9:53 PM

## 2017-07-27 NOTE — Patient Instructions (Signed)
Return for a follow up appointment as scheduled with Dr. Excell Seltzerooper. [phone] - if your pressure starts to trend up.  Check your blood pressure at home daily (if able) and keep record of the readings.  Take your BP meds as follows: CONTINUE all as prescribed  Bring all of your meds, your BP cuff and your record of home blood pressures to your next appointment.  Exercise as you're able, try to walk approximately 30 minutes per day.  Keep salt intake to a minimum, especially watch canned and prepared boxed foods.  Eat more fresh fruits and vegetables and fewer canned items.  Avoid eating in fast food restaurants.    HOW TO TAKE YOUR BLOOD PRESSURE: . Rest 5 minutes before taking your blood pressure. .  Don't smoke or drink caffeinated beverages for at least 30 minutes before. . Take your blood pressure before (not after) you eat. . Sit comfortably with your back supported and both feet on the floor (don't cross your legs). . Elevate your arm to heart level on a table or a desk. . Use the proper sized cuff. It should fit smoothly and snugly around your bare upper arm. There should be enough room to slip a fingertip under the cuff. The bottom edge of the cuff should be 1 inch above the crease of the elbow. . Ideally, take 3 measurements at one sitting and record the average.

## 2017-09-05 ENCOUNTER — Ambulatory Visit: Payer: Medicare Other | Admitting: Family Medicine

## 2017-09-06 ENCOUNTER — Ambulatory Visit: Payer: Medicare Other | Admitting: Family Medicine

## 2017-09-06 ENCOUNTER — Encounter: Payer: Self-pay | Admitting: Family Medicine

## 2017-09-06 VITALS — BP 140/80 | HR 67 | Temp 98.0°F | Resp 16 | Ht 73.0 in | Wt 273.0 lb

## 2017-09-06 DIAGNOSIS — J301 Allergic rhinitis due to pollen: Secondary | ICD-10-CM | POA: Diagnosis not present

## 2017-09-06 DIAGNOSIS — I10 Essential (primary) hypertension: Secondary | ICD-10-CM | POA: Diagnosis not present

## 2017-09-06 DIAGNOSIS — R5383 Other fatigue: Secondary | ICD-10-CM

## 2017-09-06 LAB — TSH: TSH: 1.39 u[IU]/mL (ref 0.35–4.50)

## 2017-09-06 LAB — VITAMIN B12: VITAMIN B 12: 200 pg/mL — AB (ref 211–911)

## 2017-09-06 MED ORDER — IPRATROPIUM BROMIDE 0.06 % NA SOLN: 2.0000 | Freq: Four times a day (QID) | NASAL | 0 refills | Status: DC

## 2017-09-06 NOTE — Progress Notes (Signed)
Subjective:  Wyatt Mason is a 84 y.o. male who presents today with a chief complaint of fatigue and to establish care.   HPI:  Fatigue, new problem Several month history. Worse over the past few weeks. Symptoms are worse in the afternoon.  Recently saw his cardiologist to increase his dose of metoprolol to 50 mg twice daily.  Patient thinks that this could have worsened his symptoms, but does not note any other obvious alleviating or aggravating factors.  Patient sleeps about 7 hours at night.  Gets up about 2 times.  Denies any snoring or PND.  No weight loss.    Allergic rhinitis, chronic problem, new to provider Patient with several history of allergic rhinitis.  Worsened over the last few weeks.  Tried over-the-counter medications which have not helped sniffily.  Associated symptoms include sneeze and cough.  No fevers or chills.  ROS: Per HPI, otherwise a complete review of systems was negative.   PMH:  The following were reviewed and entered/updated in epic: Past Medical History:  Diagnosis Date  . CAD (coronary artery disease) 04/15/2013   a. s/p DES x 2 to mid OM, residual RCA and LAD borderline CAD. b. Nuc 12/2014 - low risk.  Marland Kitchen Hyperglycemia   . Hyperlipidemia   . Hypertension   . Obesity   . Shingles 2005   Patient Active Problem List   Diagnosis Date Noted  . Allergic rhinitis 10/03/2014  . Hyperglycemia 04/24/2013  . Obesity 04/16/2013  . CAD (coronary artery disease), native coronary artery 04/16/2013  . NSTEMI (non-ST elevated myocardial infarction) (HCC) 04/15/2013  . ONYCHOMYCOSIS, TOENAILS 12/22/2009  . HYPERLIPIDEMIA 03/23/2007  . BPH without obstruction/lower urinary tract symptoms 03/23/2007  . POSTHERPETIC NEURALGIA 06/22/2006  . Essential hypertension 06/22/2006   Past Surgical History:  Procedure Laterality Date  . APPENDECTOMY    . CORONARY ANGIOPLASTY WITH STENT PLACEMENT  04/15/2013   sequential 90% then 95% mid OM lesions s/p DES x 2,  residual 50% prox LAD and 50-70% mid RCA stenoses; LV gram deferred  . LEFT HEART CATHETERIZATION WITH CORONARY ANGIOGRAM N/A 04/15/2013   Procedure: LEFT HEART CATHETERIZATION WITH CORONARY ANGIOGRAM;  Surgeon: Micheline Chapman, MD;  Location: [facility] C[facility] LAB;  Service: Cardiovascular;  Laterality: N/A;  . no colonoscopy     SOC reviewed 04/29/14  . TOTAL HIP ARTHROPLASTY Right 2005    Family History  Problem Relation Age of Onset  . Heart attack Father        onset in 30s; died   . CVA Neg Hx   . Cancer Neg Hx   . Diabetes Neg Hx   . Stroke Neg Hx     Medications- reviewed and updated Current Outpatient Medications  Medication Sig Dispense Refill  . amLODipine (NORVASC) 5 MG tablet Take 1 tablet (5 mg total) by mouth daily. 90 tablet 3  . aspirin EC 81 MG tablet Take 81 mg by mouth daily.    Marland Kitchen atorvastatin (LIPITOR) 80 MG tablet Take 1 tablet (80 mg total) by mouth daily. 90 tablet 3  . Cholecalciferol (VITAMIN D-3) 1000 UNITS CAPS Take 1 capsule by mouth daily.     Marland Kitchen ezetimibe (ZETIA) 10 MG tablet Take 1 tablet (10 mg total) by mouth daily. (Patient taking differently: Take 5 mg by mouth daily. ) 90 tablet 3  . isosorbide mononitrate (IMDUR) 30 MG 24 hr tablet Take 1 tablet (30 mg total) by mouth daily. 90 tablet 3  . metoprolol tartrate (LOPRESSOR) 50  MG tablet Take 1 tablet (50 mg total) by mouth 2 (two) times daily. 180 tablet 3  . Multiple Vitamin (MULTIVITAMIN WITH MINERALS) TABS tablet Take 1 tablet by mouth daily.    . nitroGLYCERIN (NITROSTAT) 0.4 MG SL tablet place 1 tablet under the tongue if needed every 5 minutes for 3 doses if needed for chest pain 25 tablet 3  . ipratropium (ATROVENT) 0.06 % nasal spray Place 2 sprays into both nostrils 4 (four) times daily. 15 mL 0   No current facility-administered medications for this visit.     Allergies-reviewed and updated Allergies  Allergen Reactions  . Other     Environmental allergies    Social History    Socioeconomic History  . Marital status: Married    Spouse name: Not on file  . Number of children: Not on file  . Years of education: Not on file  . Highest education level: Not on file  Occupational History  . Not on file  Social Needs  . Financial resource strain: Not on file  . Food insecurity:    Worry: Not on file    Inability: Not on file  . Transportation needs:    Medical: Not on file    Non-medical: Not on file  Tobacco Use  . Smoking status: Never Smoker  . Smokeless tobacco: Never Used  Substance and Sexual Activity  . Alcohol use: Yes    Alcohol/week: 1.2 oz    Types: 2 Shots of liquor per week    Comment: 2 to 3 times a week  . Drug use: No  . Sexual activity: Yes    Birth control/protection: None  Lifestyle  . Physical activity:    Days per week: Not on file    Minutes per session: Not on file  . Stress: Not on file  Relationships  . Social connections:    Talks on phone: Not on file    Gets together: Not on file    Attends religious service: Not on file    Active member of club or organization: Not on file    Attends meetings of clubs or organizations: Not on file    Relationship status: Not on file  Other Topics Concern  . Not on file  Social History Narrative  . Not on file     Objective:  Physical Exam: BP 140/80   Pulse 67   Temp 98 F (36.7 C) (Oral)   Resp 16   Ht  (1.854 m)   Wt 273 lb (123.8 kg)   SpO2 97%   BMI 36.02 kg/m   Gen: NAD, resting comfortably HEENT: TMs clear bilaterally.  Nasal mucosa erythematous and boggy with rightward septal deviation. CV: RRR with no murmurs appreciated Pulm: NWOB, CTAB with no crackles, wheezes, or rhonchi GI: Normal bowel sounds present. Soft, Nontender, Nondistended. MSK: No edema, cyanosis, or clubbing noted Skin: Warm, dry Neuro: Grossly normal, moves all extremities Psych: Normal affect and thought content  Assessment/Plan:  Essential hypertension At goal.  Continue Norvasc  5 mg daily and metoprolol 50 mg twice daily.  Patient will be follow-up with his cardiologist next week. Consider decreasing metoprolol and increasing amlodipine dose.  Allergic rhinitis Stable.  Start Atrovent nasal spray.  Continue OTC meds as needed.  Fatigue Likely multifactorial.  It is possible the increased dose of metoprolol could be contributing.  Recently had blood work which was all normal including CBC and CMET.  Will check TSH and B12 level today.  Preventative  healthcare Due for pneumonia shot.  Discussed this with patient however he deferred for today.   Katina Degree. Jimmey Ralph, MD 09/06/2017 12:21 PM

## 2017-09-06 NOTE — Patient Instructions (Addendum)
It was very nice to meet you today!  There are a lot of things that can cause fatigue.  We will check blood work to check thyroid and B12 levels.  Please talk to Dr. Excell Seltzer about your blood pressure meds.  I will see you once or twice yearly, or as needed.  Take care, Dr Jimmey Ralph

## 2017-09-06 NOTE — Assessment & Plan Note (Signed)
At goal.  Continue Norvasc 5 mg daily and metoprolol 50 mg twice daily.  Patient will be follow-up with his cardiologist next week. Consider decreasing metoprolol and increasing amlodipine dose.

## 2017-09-06 NOTE — Assessment & Plan Note (Signed)
Stable.  Start Atrovent nasal spray.  Continue OTC meds as needed.

## 2017-09-11 ENCOUNTER — Ambulatory Visit (INDEPENDENT_AMBULATORY_CARE_PROVIDER_SITE_OTHER): Payer: Medicare Other

## 2017-09-11 DIAGNOSIS — E538 Deficiency of other specified B group vitamins: Secondary | ICD-10-CM

## 2017-09-11 MED ORDER — CYANOCOBALAMIN 1000 MCG/ML IJ SOLN
1000.0000 ug | Freq: Once | INTRAMUSCULAR | Status: AC
  Administered 2017-09-11: 1000 ug via INTRAMUSCULAR

## 2017-09-11 NOTE — Progress Notes (Signed)
Patient received vitamin B12 1000 mcg in left deltoid.  Tolerated without difficulty.  Will schedule another vitamin B12 injection in 1 week.

## 2017-09-13 ENCOUNTER — Other Ambulatory Visit: Payer: Medicare Other | Admitting: *Deleted

## 2017-09-13 DIAGNOSIS — E782 Mixed hyperlipidemia: Secondary | ICD-10-CM

## 2017-09-13 LAB — LIPID PANEL
CHOL/HDL RATIO: 2.3 ratio (ref 0.0–5.0)
Cholesterol, Total: 180 mg/dL (ref 100–199)
HDL: 78 mg/dL (ref >39)
LDL CALC: 85 mg/dL (ref 0–99)
TRIGLYCERIDES: 87 mg/dL (ref 0–149)
VLDL Cholesterol Cal: 17 mg/dL (ref 5–40)

## 2017-09-13 LAB — HEPATIC FUNCTION PANEL
ALT: 17 IU/L (ref 0–44)
AST: 20 IU/L (ref 0–40)
Albumin: 4.2 g/dL (ref 3.5–4.7)
Alkaline Phosphatase: 73 IU/L (ref 39–117)
BILIRUBIN, DIRECT: 0.24 mg/dL (ref 0.00–0.40)
Bilirubin Total: 0.9 mg/dL (ref 0.0–1.2)
Total Protein: 6.2 g/dL (ref 6.0–8.5)

## 2017-09-19 ENCOUNTER — Telehealth: Payer: Self-pay | Admitting: Cardiovascular Disease

## 2017-09-19 NOTE — Telephone Encounter (Signed)
New Message ° ° ° °Patient is returning call in reference to labs. Please call.  °

## 2017-09-19 NOTE — Telephone Encounter (Signed)
Informed patient of results and verbal understanding expressed.  

## 2017-09-19 NOTE — Telephone Encounter (Signed)
-----   Message from Tonny Bollman, MD sent at 09/16/2017  7:02 AM EDT ----- Lipids improved significantly on Zetia. thx

## 2017-09-25 ENCOUNTER — Ambulatory Visit (INDEPENDENT_AMBULATORY_CARE_PROVIDER_SITE_OTHER): Payer: Medicare Other

## 2017-09-25 DIAGNOSIS — E538 Deficiency of other specified B group vitamins: Secondary | ICD-10-CM

## 2017-09-25 MED ORDER — CYANOCOBALAMIN 1000 MCG/ML IJ SOLN
1000.0000 ug | Freq: Once | INTRAMUSCULAR | Status: AC
  Administered 2017-09-25: 1000 ug via INTRAMUSCULAR

## 2017-09-25 NOTE — Progress Notes (Signed)
I have reviewed the patient's encounter and agree with the documentation.  Wyatt Mason M. [name], MD 09/25/2017 9:10 AM

## 2017-09-25 NOTE — Progress Notes (Signed)
Patient in today for B12 injection. Reviewed over protocol with patient. Medication administered in right arm. Patient tolerated well. Patient did state he had muscle soreness in left arm after last injection for several days. Patient scheduled for next week.

## 2017-10-02 ENCOUNTER — Ambulatory Visit (INDEPENDENT_AMBULATORY_CARE_PROVIDER_SITE_OTHER): Payer: Medicare Other | Admitting: *Deleted

## 2017-10-02 DIAGNOSIS — E538 Deficiency of other specified B group vitamins: Secondary | ICD-10-CM

## 2017-10-02 MED ORDER — CYANOCOBALAMIN 1000 MCG/ML IJ SOLN
1000.0000 ug | Freq: Once | INTRAMUSCULAR | Status: AC
  Administered 2017-10-02: 1000 ug via INTRAMUSCULAR

## 2017-10-02 MED ORDER — CYANOCOBALAMIN 1000 MCG/ML IJ SOLN: 1000.0000 ug | Freq: Once | INTRAMUSCULAR | Status: DC

## 2017-10-02 NOTE — Progress Notes (Signed)
Per orders of Dr.Parker, injection of vitamin b12 given  left deltoid IM by Olevia BowensJennifer M Tangy Drozdowski, CMA  Patient tolerated injection well.

## 2017-10-09 ENCOUNTER — Ambulatory Visit (INDEPENDENT_AMBULATORY_CARE_PROVIDER_SITE_OTHER): Payer: Medicare Other

## 2017-10-09 DIAGNOSIS — E538 Deficiency of other specified B group vitamins: Secondary | ICD-10-CM

## 2017-10-09 MED ORDER — CYANOCOBALAMIN 1000 MCG/ML IJ SOLN
1000.0000 ug | Freq: Once | INTRAMUSCULAR | Status: AC
  Administered 2017-10-09: 1000 ug via INTRAMUSCULAR

## 2017-10-09 NOTE — Progress Notes (Signed)
Patient in today for B12 injection due to B12 deficiency. Administered in right arm. Patient tolerated well. 

## 2017-11-03 ENCOUNTER — Emergency Department (HOSPITAL_COMMUNITY)
Admission: EM | Admit: 2017-11-03 | Discharge: 2017-11-03 | Disposition: A | Discharge status: Home / Self Care | Payer: Medicare Other | Attending: Emergency Medicine | Admitting: Emergency Medicine

## 2017-11-03 ENCOUNTER — Encounter (HOSPITAL_COMMUNITY): Payer: Self-pay | Admitting: *Deleted

## 2017-11-03 DIAGNOSIS — Z7982 Long term (current) use of aspirin: Secondary | ICD-10-CM | POA: Insufficient documentation

## 2017-11-03 DIAGNOSIS — Y939 Activity, unspecified: Secondary | ICD-10-CM | POA: Insufficient documentation

## 2017-11-03 DIAGNOSIS — Y929 Unspecified place or not applicable: Secondary | ICD-10-CM | POA: Insufficient documentation

## 2017-11-03 DIAGNOSIS — I10 Essential (primary) hypertension: Secondary | ICD-10-CM | POA: Insufficient documentation

## 2017-11-03 DIAGNOSIS — Z79899 Other long term (current) drug therapy: Secondary | ICD-10-CM | POA: Insufficient documentation

## 2017-11-03 DIAGNOSIS — I2510 Atherosclerotic heart disease of native coronary artery without angina pectoris: Secondary | ICD-10-CM | POA: Insufficient documentation

## 2017-11-03 DIAGNOSIS — S61012A Laceration without foreign body of left thumb without damage to nail, initial encounter: Secondary | ICD-10-CM

## 2017-11-03 DIAGNOSIS — W230XXA Caught, crushed, jammed, or pinched between moving objects, initial encounter: Secondary | ICD-10-CM | POA: Diagnosis not present

## 2017-11-03 DIAGNOSIS — Y999 Unspecified external cause status: Secondary | ICD-10-CM | POA: Diagnosis not present

## 2017-11-03 NOTE — ED Provider Notes (Signed)
[facility] EMERGENCY DEPARTMENT Provider Note   CSN: [id number] Arrival date & time: 11/03/17  0920     History   Chief Complaint Chief Complaint  Patient presents with  . Finger Injury    HPI Wyatt Mason is a 84 y.o. male here for evaluation of wound to the left top of thumb sustained immediately PTA.  Patient states he was unlocking some outdoor windows when the top window fell directly on his thumb causing a laceration.  He rinsed the wound under water and wife applied to Band-Aids.  He has minimal pain.  Bleeding has stopped.  Tetanus 4 years ago.  He is not on any anticoagulants.  He is right-hand dominant.  He denies tingling or loss of sensation distally.  HPI  Past Medical History:  Diagnosis Date  . CAD (coronary artery disease) 04/15/2013   a. s/p DES x 2 to mid OM, residual RCA and LAD borderline CAD. b. Nuc 12/2014 - low risk.  Marland Kitchen Hyperglycemia   . Hyperlipidemia   . Hypertension   . Obesity   . Shingles 2005    Patient Active Problem List   Diagnosis Date Noted  . Allergic rhinitis 10/03/2014  . Hyperglycemia 04/24/2013  . Obesity 04/16/2013  . CAD (coronary artery disease), native coronary artery 04/16/2013  . NSTEMI (non-ST elevated myocardial infarction) (HCC) 04/15/2013  . ONYCHOMYCOSIS, TOENAILS 12/22/2009  . HYPERLIPIDEMIA 03/23/2007  . BPH without obstruction/lower urinary tract symptoms 03/23/2007  . POSTHERPETIC NEURALGIA 06/22/2006  . Essential hypertension 06/22/2006    Past Surgical History:  Procedure Laterality Date  . APPENDECTOMY    . CORONARY ANGIOPLASTY WITH STENT PLACEMENT  04/15/2013   sequential 90% then 95% mid OM lesions s/p DES x 2, residual 50% prox LAD and 50-70% mid RCA stenoses; LV gram deferred  . LEFT HEART CATHETERIZATION WITH CORONARY ANGIOGRAM N/A 04/15/2013   Procedure: LEFT HEART CATHETERIZATION WITH CORONARY ANGIOGRAM;  Surgeon: Micheline Chapman, MD;  Location: [facility] CATH LAB;  Service:  Cardiovascular;  Laterality: N/A;  . no colonoscopy     SOC reviewed 04/29/14  . TOTAL HIP ARTHROPLASTY Right 2005        Home Medications    Prior to Admission medications   Medication Sig Start Date End Date Taking? Authorizing Provider  amLODipine (NORVASC) 5 MG tablet Take 1 tablet (5 mg total) by mouth daily. 06/09/17 06/04/18  Tonny Bollman, MD  aspirin EC 81 MG tablet Take 81 mg by mouth daily.    [provider]  atorvastatin (LIPITOR) 80 MG tablet Take 1 tablet (80 mg total) by mouth daily. 06/09/17 06/04/18  Tonny Bollman, MD  Cholecalciferol (VITAMIN D-3) 1000 UNITS CAPS Take 1 capsule by mouth daily.     [provider]  ezetimibe (ZETIA) 10 MG tablet Take 1 tablet (10 mg total) by mouth daily. Patient taking differently: Take 5 mg by mouth daily.  06/15/17 06/10/18  Tonny Bollman, MD  ipratropium (ATROVENT) 0.06 % nasal spray Place 2 sprays into both nostrils 4 (four) times daily. 09/06/17   Ardith Dark, MD  isosorbide mononitrate (IMDUR) 30 MG 24 hr tablet Take 1 tablet (30 mg total) by mouth daily. 06/09/17 06/04/18  Tonny Bollman, MD  metoprolol tartrate (LOPRESSOR) 50 MG tablet Take 1 tablet (50 mg total) by mouth 2 (two) times daily. 06/09/17 06/04/18  Tonny Bollman, MD  Multiple Vitamin (MULTIVITAMIN WITH MINERALS) TABS tablet Take 1 tablet by mouth daily.    [provider]  nitroGLYCERIN (NITROSTAT)  0.4 MG SL tablet place 1 tablet under the tongue if needed every 5 minutes for 3 doses if needed for chest pain 07/27/17   Tonny Bollmanooper, Michael, MD    Family History Family History  Problem Relation Age of Onset  . Heart attack Father        onset in 30s; died @58   . CVA Neg Hx   . Cancer Neg Hx   . Diabetes Neg Hx   . Stroke Neg Hx     Social History Social History   Tobacco Use  . Smoking status: Never Smoker  . Smokeless tobacco: Never Used  Substance Use Topics  . Alcohol use: Yes    Alcohol/week: 1.2 oz    Types: 2 Shots of  liquor per week    Comment: 2 to 3 times a week  . Drug use: No     Allergies   Other   Review of Systems Review of Systems  Skin: Positive for wound.  All other systems reviewed and are negative.    Physical Exam Updated Vital Signs BP (!) 153/79 (BP Location: Right Arm)   Pulse 90   Temp 98.6 F (37 C) (Oral)   Resp 18   SpO2 96%   Physical Exam  Constitutional: He is oriented to person, place, and time. He appears well-developed and well-nourished.  Non-toxic appearance.  HENT:  Head: Normocephalic.  Right Ear: External ear normal.  Left Ear: External ear normal.  Nose: Nose normal.  Eyes: Conjunctivae and EOM are normal.  Neck: Full passive range of motion without pain.  Cardiovascular: Normal rate.  Pulmonary/Chest: Effort normal. No tachypnea. No respiratory distress.  Musculoskeletal: Normal range of motion.  Neurological: He is alert and oriented to person, place, and time.  Skin: Skin is warm and dry. Capillary refill takes less than 2 seconds.  There is a 1 cm laceration to dorsal/medial aspect of thumb.  Hemostatic. Edges well approximated. Full AROM of extremity without re-bleed or dehiscence. No nail or nail bed injury.   Psychiatric: His behavior is normal. Thought content normal.     ED Treatments / Results  Labs (all labs ordered are listed, but only abnormal results are displayed) Labs Reviewed - No data to display  EKG None  Radiology No results found.  Procedures Procedures (including critical care time)  Medications Ordered in ED Medications - No data to display   Initial Impression / Assessment and Plan / ED Course  I have reviewed the triage vital signs and the nursing notes.  Pertinent labs & imaging results that were available during my care of the patient were reviewed by me and considered in my medical decision making (see chart for details).     Patient is a 84 y.o. yo male that presents with laceration. Tdap UTD.  Pressure irrigation performed. No foreign bodies seen.  No tendon or nerve injury noted. Full ROM of affected extremity. Wound hemostatic without dehiscence during AROM.  No indication for suture repair.  Will apply pressure dressing. Pt has no co morbidities to effect normal wound healing. Discussed suture home care with pt and answered questions.   Final Clinical Impressions(s) / ED Diagnoses   Final diagnoses:  Laceration of left thumb without foreign body without damage to nail, initial encounter    ED Discharge Orders    None       Jerrell MylarGibbons, Tylicia Sherman J, PA-C 11/03/17 1116    Wynetta FinesMessick, Peter C, MD 11/05/17 1000

## 2017-11-03 NOTE — Discharge Instructions (Addendum)
You were seen in the ER for thumb injury.   Thankfully laceration was not bleeding and well approximated.    We will treat this with a pressure dressing and good wound care at home.   Keep dressing on for next 24-48 hours. After, rinse with clean water and soap, tap dry and apply a thin layer of antibiotic ointment at least once daily. Use gauze and coban as instructed to replace dressing. You can switch to regular bandaid when it no longer bleeds.   Monitor for signs of infection including increased pain, redness, warmth, swelling, pus, fevers.

## 2017-11-03 NOTE — ED Triage Notes (Signed)
Pt in stating he had a window fall on the tip of his left thumb, occurred just PTA

## 2017-11-03 NOTE — ED Notes (Signed)
ED Provider at bedside. 

## 2018-01-25 ENCOUNTER — Other Ambulatory Visit: Payer: Self-pay | Admitting: Family Medicine

## 2018-05-07 NOTE — Progress Notes (Signed)
Cardiology Office Note:    Date:  05/08/2018   ID:  Wyatt Ausharles R Holstad, DOB 03-04-1933, MRN [id number]  PCP:  Ardith DarkParker, Caleb M, MD  Cardiologist:  Tonny BollmanMichael Cooper, MD   Electrophysiologist:  None   Referring MD: Ardith DarkParker, Caleb M, MD   Chief Complaint  Patient presents with  . Follow-up    CAD     History of Present Illness:    Wyatt Mason is a 85 y.o. male with coronary artery disease s/p NSTEMI in 2014 treated with DES x 2 to OM, hypertension, hyperlipidemia.  He was last seen by Dr. Excell Seltzerooper in 05/2017.     Wyatt Mason returns for follow up.  He is here alone.  His wife has noticed swelling in his ankles.  He feels it is normal for him.  He has not had any chest pain, significant shortness of breath, syncope, paroxysmal nocturnal dyspnea.  He does eat out a lot.  His BPs at home avg 140s systolic.   Prior CV studies:   The following studies were reviewed today:  Nuclear stress test 01/12/15 EF 55-65; This study is significantly affected by artifacts causing decreased uptake in the basal and mid inferior and inferolateral walls and apical wall at rest, however they all improve with stress. There is no ischemia. Low risk study.   Echo 04/16/13 Mild LVH, EF 55-60, Gr 1 DD, mild BAE  Cardiac Catheterization 04/15/13 LM mild calcification LAD prox 50 with heavy calcification OM mid 90, then sequential 95 RCA mid 50-70 with heavy calcification PCI: 4 x 12 mm Promus Premier DES x 2 to OM  Past Medical History:  Diagnosis Date  . CAD (coronary artery disease) 04/15/2013   a. s/p DES x 2 to mid OM, residual RCA and LAD borderline CAD. b. Nuc 12/2014 - low risk.  Marland Kitchen. Hyperglycemia   . Hyperlipidemia   . Hypertension   . Obesity   . Shingles 2005   Surgical Hx: The patient  has a past surgical history that includes Total hip arthroplasty (Right, 2005); Appendectomy; Coronary angioplasty with stent (04/15/2013); left heart catheterization with coronary angiogram (N/A, 04/15/2013); and  no colonoscopy.   Current Medications: Current Meds  Medication Sig  . amLODipine (NORVASC) 5 MG tablet Take 1 tablet (5 mg total) by mouth daily.  Marland Kitchen. amoxicillin (AMOXIL) 500 MG capsule Take 500 mg by mouth as needed (for Dental appointments).   Marland Kitchen. aspirin EC 81 MG tablet Take 81 mg by mouth daily.  Marland Kitchen. atorvastatin (LIPITOR) 80 MG tablet Take 1 tablet (80 mg total) by mouth daily.  . Cholecalciferol (VITAMIN D-3) 1000 UNITS CAPS Take 1 capsule by mouth daily.   Marland Kitchen. ezetimibe (ZETIA) 10 MG tablet Take 1 tablet (10 mg total) by mouth daily.  . isosorbide mononitrate (IMDUR) 30 MG 24 hr tablet Take 1 tablet (30 mg total) by mouth daily.  . metoprolol tartrate (LOPRESSOR) 50 MG tablet Take 1 tablet (50 mg total) by mouth 2 (two) times daily.  . Multiple Vitamin (MULTIVITAMIN WITH MINERALS) TABS tablet Take 1 tablet by mouth daily.  . nitroGLYCERIN (NITROSTAT) 0.4 MG SL tablet place 1 tablet under the tongue if needed every 5 minutes for 3 doses if needed for chest pain  . [DISCONTINUED] ipratropium (ATROVENT) 0.06 % nasal spray INSTILL 2 SPRAYS INTO BOTH NOSTRILS FOUR TIMES DAILY  . [DISCONTINUED] nitroGLYCERIN (NITROSTAT) 0.4 MG SL tablet place 1 tablet under the tongue if needed every 5 minutes for 3 doses if needed for chest pain  Allergies:   Other   Social History   Tobacco Use  . Smoking status: Never Smoker  . Smokeless tobacco: Never Used  Substance Use Topics  . Alcohol use: Yes    Alcohol/week: 2.0 standard drinks    Types: 2 Shots of liquor per week    Comment: 2 to 3 times a week  . Drug use: No     Family Hx: The patient's family history includes Heart attack in his father. There is no history of CVA, Cancer, Diabetes, or Stroke.  ROS:   Please see the history of present illness.    Review of Systems  HENT: Positive for hearing loss.   Respiratory: Positive for cough.   Psychiatric/Behavioral: Positive for depression.   All other systems reviewed and are  negative.   EKGs/Labs/Other Test Reviewed:    EKG:  EKG is  ordered today.  The ekg ordered today demonstrates NSR, HR 62, normal axis, QTc 426, no changes.   Recent Labs: 06/09/2017: BUN 17; Creatinine, Ser 0.79; Hemoglobin 13.6; Platelets 194; Potassium 4.4; Sodium 140 09/06/2017: TSH 1.39 09/13/2017: ALT 17   Recent Lipid Panel Lab Results  Component Value Date/Time   CHOL 180 09/13/2017 08:03 AM   TRIG 87 09/13/2017 08:03 AM   HDL 78 09/13/2017 08:03 AM   CHOLHDL 2.3 09/13/2017 08:03 AM   CHOLHDL 1.8 04/08/2015 08:01 AM   LDLCALC 85 09/13/2017 08:03 AM    Physical Exam:    VS:  BP 140/82   Pulse 62   Ht 6\' 1"  (1.854 m)   Wt 275 lb (124.7 kg)   SpO2 94%   BMI 36.28 kg/m     Wt Readings from Last 3 Encounters:  05/08/18 275 lb (124.7 kg)  09/06/17 273 lb (123.8 kg)  06/09/17 264 lb 12.8 oz (120.1 kg)     Physical Exam  Constitutional: He is oriented to person, place, and time. He appears well-developed and well-nourished. No distress.  HENT:  Head: Normocephalic and atraumatic.  Eyes: No scleral icterus.  Neck: Neck supple. No JVD present. No thyromegaly present.  Cardiovascular: Normal rate, regular rhythm, S1 normal and S2 normal.  Murmur heard.  Systolic murmur is present with a grade of 1/6 at the upper left sternal border. Pulmonary/Chest: Breath sounds normal. He has no rales.  Abdominal: Soft. There is no hepatomegaly.  Musculoskeletal:        General: Edema (1+ bilat LE edema) present.  Lymphadenopathy:    He has no cervical adenopathy.  Neurological: He is alert and oriented to person, place, and time.  Skin: Skin is warm and dry.  Psychiatric: He has a normal mood and affect.    ASSESSMENT & PLAN:    Coronary artery disease involving native coronary artery of native heart with angina pectoris S/p NSTEMI in 2014 treated with DES x 2 to the OM.  He has residual disease with 50% proximal LAD stenosis and 50-70% mid RCA stenosis that is treated  medically.  A Nuclear stress test in 2016 was low risk.  He is not having any anginal symptoms on his current medical regimen.  Continue ASA, statin, amlodipine, metoprolol tartrate, isosorbide.  Follow up in 1 year.   Essential hypertension BP remains above target.  He probably has too much salt in his diet.  He also has some lower extremity swelling.  I have suggested adding HCTZ to his medical regimen to help with his swelling and blood pressure.    -Start HCTZ 12.5 mg QD  -  BMET 2 weeks  HYPERLIPIDEMIA LDL optimal on most recent lab work.  Continue current Rx.     Dispo:  Return in about 1 year (around 05/09/2019) for Routine Follow Up, w/ Dr. Excell Seltzer.   Medication Adjustments/Labs and Tests Ordered: Current medicines are reviewed at length with the patient today.  Concerns regarding medicines are outlined above.  Tests Ordered: Orders Placed This Encounter  Procedures  . Basic metabolic panel  . EKG 12-Lead   Medication Changes: Meds ordered this encounter  Medications  . hydrochlorothiazide (MICROZIDE) 12.5 MG capsule    Sig: Take 1 capsule (12.5 mg total) by mouth daily.    Dispense:  90 capsule    Refill:  3  . nitroGLYCERIN (NITROSTAT) 0.4 MG SL tablet    Sig: place 1 tablet under the tongue if needed every 5 minutes for 3 doses if needed for chest pain    Dispense:  25 tablet    Refill:  3    Signed, Tereso Newcomer, PA-C  05/08/2018 12:56 PM    [facility] [address] [place], [place], [place]  [address] Phone: [phone]; Fax: [phone]

## 2018-05-08 ENCOUNTER — Encounter: Payer: Self-pay | Admitting: Physician Assistant

## 2018-05-08 ENCOUNTER — Ambulatory Visit: Payer: Medicare Other | Admitting: Physician Assistant

## 2018-05-08 VITALS — BP 140/82 | HR 62 | Ht 73.0 in | Wt 275.0 lb

## 2018-05-08 DIAGNOSIS — I25119 Atherosclerotic heart disease of native coronary artery with unspecified angina pectoris: Secondary | ICD-10-CM

## 2018-05-08 DIAGNOSIS — E782 Mixed hyperlipidemia: Secondary | ICD-10-CM | POA: Diagnosis not present

## 2018-05-08 DIAGNOSIS — I10 Essential (primary) hypertension: Secondary | ICD-10-CM

## 2018-05-08 MED ORDER — HYDROCHLOROTHIAZIDE 12.5 MG PO CAPS: 12.5000 mg | ORAL_CAPSULE | Freq: Every day | ORAL | 3 refills | Status: DC

## 2018-05-08 MED ORDER — NITROGLYCERIN 0.4 MG SL SUBL: SUBLINGUAL_TABLET | SUBLINGUAL | 3 refills | Status: DC

## 2018-05-08 NOTE — Patient Instructions (Signed)
Medication Instructions:  Your physician has recommended you make the following change in your medication:  1. START HYDROCHLOROTHIAZIDE 12.5 MG DAILY.    If you need a refill on your cardiac medications before your next appointment, please call your pharmacy.   Lab work: TO BE DONE IN 2 WEEKS: BMET  If you have labs (blood work) drawn today and your tests are completely normal, you will receive your results only by: Marland Kitchen MyChart Message (if you have MyChart) OR . A paper copy in the mail If you have any lab test that is abnormal or we need to change your treatment, we will call you to review the results.  Testing/Procedures: NONE  Follow-Up: At [facility], you and your health needs are our priority.  As part of our continuing mission to provide you with exceptional heart care, we have created designated Provider Care Teams.  These Care Teams include your primary Cardiologist (physician) and Advanced Practice Providers (APPs -  Physician Assistants and Nurse Practitioners) who all work together to provide you with the care you need, when you need it. You will need a follow up appointment in:  12 months.  Please call our office 2 months in advance to schedule this appointment.  You may see Tonny Bollman, MD or one of the following Advanced Practice Providers on your designated Care Team: Tereso Newcomer, PA-C [place], [place] . Berton Bon, NP  Any Other Special Instructions Will Be Listed Below (If Applicable).

## 2018-05-22 ENCOUNTER — Other Ambulatory Visit: Payer: Medicare Other

## 2018-05-22 DIAGNOSIS — I10 Essential (primary) hypertension: Secondary | ICD-10-CM

## 2018-05-22 DIAGNOSIS — I25119 Atherosclerotic heart disease of native coronary artery with unspecified angina pectoris: Secondary | ICD-10-CM

## 2018-05-22 LAB — BASIC METABOLIC PANEL
BUN/Creatinine Ratio: 22 (ref 10–24)
BUN: 18 mg/dL (ref 8–27)
CALCIUM: 9.3 mg/dL (ref 8.6–10.2)
CO2: 28 mmol/L (ref 20–29)
Chloride: 99 mmol/L (ref 96–106)
Creatinine, Ser: 0.83 mg/dL (ref 0.76–1.27)
GFR calc Af Amer: 94 mL/min/{1.73_m2} (ref >59)
GFR calc non Af Amer: 81 mL/min/{1.73_m2} (ref >59)
Glucose: 108 mg/dL — ABNORMAL HIGH (ref 65–99)
POTASSIUM: 4.6 mmol/L (ref 3.5–5.2)
Sodium: 141 mmol/L (ref 134–144)

## 2018-07-24 ENCOUNTER — Other Ambulatory Visit: Payer: Self-pay | Admitting: Cardiovascular Disease

## 2018-10-08 ENCOUNTER — Encounter: Payer: Self-pay | Admitting: Cardiovascular Disease

## 2018-10-08 ENCOUNTER — Telehealth: Payer: Self-pay | Admitting: Cardiovascular Disease

## 2018-10-08 NOTE — Telephone Encounter (Signed)
   [facility] Pre-operative Risk Assessment    Request for surgical clearance:  1. What type of surgery is being performed? L knee arthroscopy   2. When is this surgery scheduled? 10/09/2018  3. What type of clearance is required (medical clearance vs. Pharmacy clearance to hold med vs. Both)? ASA - the patient has already held   4. Are there any medications that need to be held prior to surgery and how long? ASA- already holding   5. Practice name and name of physician performing surgery? Dr. Maureen Ralphs  6. What is your office phone number [phone] Claiborne Billings- Dr. Peri Maris nurse) - requests a call to confirm clearance ASAP   7.   What is your office fax number [phone]  8.   Anesthesia type (None, local, MAC, general) ? MAC

## 2018-10-08 NOTE — Telephone Encounter (Signed)
Confirmed with Claiborne Billings, RN that cardiac clearance has been obtained and is in Epic for viewing.  It has also been faxed. She was grateful for assistance.

## 2018-10-08 NOTE — Telephone Encounter (Signed)
This encounter was created in error - please disregard.

## 2018-10-08 NOTE — Telephone Encounter (Signed)
  Claiborne Billings from Dr Aluisio's office needs to speak with someone to get a medical clearance

## 2018-10-08 NOTE — Telephone Encounter (Signed)
Per Lenice Llamas, RN, she will call back since she just spoke with surgeon's office re: clearance. Will take out of preop call back.

## 2018-10-08 NOTE — Telephone Encounter (Signed)
   Primary Cardiologist: Sherren Mocha, MD  Chart reviewed as part of pre-operative protocol coverage. Given past medical history and time since last visit, based on ACC/AHA guidelines, Wyatt Mason would be at acceptable risk for the planned procedure without further cardiovascular testing.   I will route this recommendation to the requesting party via Epic fax function and remove from pre-op pool.  I will have my covering nurse to call regarding clearance.   Please call with questions.  [place], [place] 10/08/2018, 4:29 PM

## 2018-10-08 NOTE — Telephone Encounter (Signed)
New Message     Pt is calling because he says he is suppose to have surgery on his left knee tomorrow  The office performing says he needs a release form for the surgery from his cardiologist    Please call back

## 2018-11-28 ENCOUNTER — Ambulatory Visit (INDEPENDENT_AMBULATORY_CARE_PROVIDER_SITE_OTHER): Payer: Medicare Other | Admitting: Family Medicine

## 2018-11-28 ENCOUNTER — Encounter: Payer: Self-pay | Admitting: Family Medicine

## 2018-11-28 ENCOUNTER — Other Ambulatory Visit: Payer: Self-pay

## 2018-11-28 VITALS — BP 134/76 | HR 69 | Temp 98.3°F | Ht 73.0 in | Wt 252.2 lb

## 2018-11-28 DIAGNOSIS — N400 Enlarged prostate without lower urinary tract symptoms: Secondary | ICD-10-CM

## 2018-11-28 DIAGNOSIS — I25119 Atherosclerotic heart disease of native coronary artery with unspecified angina pectoris: Secondary | ICD-10-CM

## 2018-11-28 DIAGNOSIS — Z23 Encounter for immunization: Secondary | ICD-10-CM

## 2018-11-28 DIAGNOSIS — E782 Mixed hyperlipidemia: Secondary | ICD-10-CM

## 2018-11-28 DIAGNOSIS — R739 Hyperglycemia, unspecified: Secondary | ICD-10-CM

## 2018-11-28 DIAGNOSIS — I10 Essential (primary) hypertension: Secondary | ICD-10-CM | POA: Diagnosis not present

## 2018-11-28 DIAGNOSIS — Z6833 Body mass index (BMI) 33.0-33.9, adult: Secondary | ICD-10-CM

## 2018-11-28 DIAGNOSIS — Z0001 Encounter for general adult medical examination with abnormal findings: Secondary | ICD-10-CM

## 2018-11-28 DIAGNOSIS — E669 Obesity, unspecified: Secondary | ICD-10-CM

## 2018-11-28 DIAGNOSIS — J301 Allergic rhinitis due to pollen: Secondary | ICD-10-CM

## 2018-11-28 LAB — COMPREHENSIVE METABOLIC PANEL
ALT: 11 U/L (ref 0–53)
AST: 12 U/L (ref 0–37)
Albumin: 4.1 g/dL (ref 3.5–5.2)
Alkaline Phosphatase: 66 U/L (ref 39–117)
BUN: 17 mg/dL (ref 6–23)
CO2: 31 mEq/L (ref 19–32)
Calcium: 9.1 mg/dL (ref 8.4–10.5)
Chloride: 101 mEq/L (ref 96–112)
Creatinine, Ser: 0.7 mg/dL (ref 0.40–1.50)
GFR: 107.39 mL/min (ref >60.00)
Glucose, Bld: 117 mg/dL — ABNORMAL HIGH (ref 70–99)
Potassium: 4.2 mEq/L (ref 3.5–5.1)
Sodium: 140 mEq/L (ref 135–145)
Total Bilirubin: 0.8 mg/dL (ref 0.2–1.2)
Total Protein: 6.3 g/dL (ref 6.0–8.3)

## 2018-11-28 LAB — CBC
HCT: 37.3 % — ABNORMAL LOW (ref 39.0–52.0)
Hemoglobin: 12.6 g/dL — ABNORMAL LOW (ref 13.0–17.0)
MCHC: 33.9 g/dL (ref 30.0–36.0)
MCV: 93.1 fl (ref 78.0–100.0)
Platelets: 196 10*3/uL (ref 150.0–400.0)
RBC: 4.01 Mil/uL — ABNORMAL LOW (ref 4.22–5.81)
RDW: 13.7 % (ref 11.5–15.5)
WBC: 4.7 10*3/uL (ref 4.0–10.5)

## 2018-11-28 LAB — HEMOGLOBIN A1C: Hgb A1c MFr Bld: 5.7 % (ref 4.6–6.5)

## 2018-11-28 LAB — LIPID PANEL
Cholesterol: 203 mg/dL — ABNORMAL HIGH (ref 0–200)
HDL: 72.2 mg/dL (ref >39.00)
LDL Cholesterol: 111 mg/dL — ABNORMAL HIGH (ref 0–99)
NonHDL: 131.01
Total CHOL/HDL Ratio: 3
Triglycerides: 101 mg/dL (ref 0.0–149.0)
VLDL: 20.2 mg/dL (ref 0.0–40.0)

## 2018-11-28 LAB — TSH: TSH: 1.44 u[IU]/mL (ref 0.35–4.50)

## 2018-11-28 NOTE — Assessment & Plan Note (Signed)
Continue management per cardiology.  Check CBC, C met, TSH, and lipid panel.

## 2018-11-28 NOTE — Assessment & Plan Note (Signed)
Check lipid panel.  Continue Lipitor 80 mg daily and Zetia 10 mg daily. 

## 2018-11-28 NOTE — Progress Notes (Signed)
Chief Complaint:  Wyatt Mason is a 85 y.o. male who presents today for his annual comprehensive physical exam and subsequent medicare annual wellness visit.    Assessment/Plan:  Essential hypertension At goal.  Continue Norvasc 5 mg daily, metoprolol 50 mg twice daily, Imdur 30 mg daily, and HCTZ 12.5 mg daily.  Allergic rhinitis Stable.  Continue over-the-counter antihistamines as needed.  Hyperglycemia Check A1c.  CAD (coronary artery disease), native coronary artery Continue management per cardiology.  Check CBC, C met, TSH, and lipid panel.  HYPERLIPIDEMIA Check lipid panel.  Continue Lipitor 80 mg daily and Zetia 10 mg daily.   Body mass index is 33.28 kg/m. / Obesity BMI Metric Follow Up - 11/28/18 0949      BMI Metric Follow Up-Please document annually   BMI Metric Follow Up  Education provided        Preventative Healthcare: Prevnar given today. Due for flu vaccine later this year.   Patient Counseling(The following topics were reviewed and/or handout was given):  -Nutrition: Stressed importance of moderation in sodium/caffeine intake, saturated fat and cholesterol, caloric balance, sufficient intake of fresh fruits, vegetables, and fiber.  -Stressed the importance of regular exercise.   -Substance Abuse: Discussed cessation/primary prevention of tobacco, alcohol, or other drug use; driving or other dangerous activities under the influence; availability of treatment for abuse.   -Injury prevention: Discussed safety belts, safety helmets, smoke detector, smoking near bedding or upholstery.   -Sexuality: Discussed sexually transmitted diseases, partner selection, use of condoms, avoidance of unintended pregnancy and contraceptive alternatives.   -Dental health: Discussed importance of regular tooth brushing, flossing, and dental visits.  -Health maintenance and immunizations reviewed. Please refer to Health maintenance section.  During the course of the visit  the patient was educated and counseled about appropriate screening and preventive services including:        Fall prevention   Nutrition Physical Activity Weight Management Cognition  Return to care in 1 year for next preventative visit.     Subjective:  HPI:  Health Risk Assessment: Patient considers his overall health to be good. He has no difficulty performing the following: . Preparing food and eating . Bathing  . Getting dressed . Using the toilet . Shopping . Managing Finances . Moving around from place to place  He has not had any falls within the past year.   Depression screen PHQ 2/9 11/28/2018  Decreased Interest 0  Down, Depressed, Hopeless 0  PHQ - 2 Score 0  Altered sleeping -  Tired, decreased energy -  Change in appetite -  Feeling bad or failure about yourself  -  Trouble concentrating -  Moving slowly or fidgety/restless -  Suicidal thoughts -  PHQ-9 Score -  Difficult doing work/chores -   Lifestyle Factors: Diet: No specific diets or eating plans.  Exercise: Walks regularly around his neighborhood.   Patient Care Team: Vivi Barrack, MD as PCP - General (Family Medicine) Sherren Mocha, MD as PCP - Cardiology (Cardiology)   His stable, chronic medical conditions are outlined below:  # Essential Hypertension - On amlodipine 35m daily, HCTZ 12.533mdaily, imdur 3032maily, and metoprolol tartrate 84m54mice daily - ROS: No reported chest pain or shortness of breath  # Dyslipidemia / CAD - Follows with cardiology - On lipitor 80mg2mly and zetia 10mg 52my - ROS: No reported mylagias  # Allergic Rhinitis - Takes OTC allergy medications  ROS: Per HPI, otherwise a complete review of systems was negative.  PMH:  The following were reviewed and entered/updated in epic: Past Medical History:  Diagnosis Date  . CAD (coronary artery disease) 04/15/2013   a. s/p DES x 2 to mid OM, residual RCA and LAD borderline CAD. b. Nuc 12/2014 - low  risk.  Marland Kitchen Hyperglycemia   . Hyperlipidemia   . Hypertension   . Obesity   . Shingles 2005   Patient Active Problem List   Diagnosis Date Noted  . Allergic rhinitis 10/03/2014  . Hyperglycemia 04/24/2013  . CAD (coronary artery disease), native coronary artery 04/16/2013  . ONYCHOMYCOSIS, TOENAILS 12/22/2009  . HYPERLIPIDEMIA 03/23/2007  . POSTHERPETIC NEURALGIA 06/22/2006  . Essential hypertension 06/22/2006   Past Surgical History:  Procedure Laterality Date  . APPENDECTOMY    . CORONARY ANGIOPLASTY WITH STENT PLACEMENT  04/15/2013   sequential 90% then 95% mid OM lesions s/p DES x 2, residual 50% prox LAD and 50-70% mid RCA stenoses; LV gram deferred  . KNEE ARTHROSCOPY    . LEFT HEART CATHETERIZATION WITH CORONARY ANGIOGRAM N/A 04/15/2013   Procedure: LEFT HEART CATHETERIZATION WITH CORONARY ANGIOGRAM;  Surgeon: Blane Ohara, MD;  Location[facility] C[facility] LAB;  Service: Cardiovascular;  Laterality: N/A;  . no colonoscopy     SOC reviewed 04/29/14  . TOTAL HIP ARTHROPLASTY Right 2005    Family History  Problem Relation Age of Onset  . Heart attack Father        onset in 87s; died _0   . CVA Neg Hx   . Cancer Neg Hx   . Diabetes Neg Hx   . Stroke Neg Hx     Medications- reviewed and updated Current Outpatient Medications  Medication Sig Dispense Refill  . amLODipine (NORVASC) 5 MG tablet TAKE 1 TABLET BY MOUTH ONCE DAILY 90 tablet 3  . amoxicillin (AMOXIL) 500 MG capsule Take 500 mg by mouth as needed (for Dental appointments).     Marland Kitchen aspirin EC 81 MG tablet Take 81 mg by mouth daily.    Marland Kitchen atorvastatin (LIPITOR) 80 MG tablet TAKE 1 TABLET BY MOUTH ONCE DAILY 90 tablet 3  . Cholecalciferol (VITAMIN D-3) 1000 UNITS CAPS Take 1 capsule by mouth daily.     Marland Kitchen ezetimibe (ZETIA) 10 MG tablet TAKE 1 TABLET BY MOUTH ONCE DAILY 90 tablet 3  . hydrochlorothiazide (MICROZIDE) 12.5 MG capsule Take 1 capsule (12.5 mg total) by mouth daily. 90 capsule 3  . isosorbide mononitrate (IMDUR)  30 MG 24 hr tablet TAKE 1 TABLET BY MOUTH ONCE DAILY 90 tablet 3  . metoprolol tartrate (LOPRESSOR) 50 MG tablet TAKE 1 TABLET BY MOUTH TWICE A DAY 180 tablet 3  . Multiple Vitamin (MULTIVITAMIN WITH MINERALS) TABS tablet Take 1 tablet by mouth daily.    . nitroGLYCERIN (NITROSTAT) 0.4 MG SL tablet place 1 tablet under the tongue if needed every 5 minutes for 3 doses if needed for chest pain 25 tablet 3   No current facility-administered medications for this visit.     Allergies-reviewed and updated Allergies  Allergen Reactions  . Other     Environmental allergies    Social History   Socioeconomic History  . Marital status: Married    Spouse name: Not on file  . Number of children: Not on file  . Years of education: Not on file  . Highest education level: Not on file  Occupational History  . Not on file  Social Needs  . Financial resource strain: Not on file  . Food insecurity  Worry: Not on file    Inability: Not on file  . Transportation needs    Medical: Not on file    Non-medical: Not on file  Tobacco Use  . Smoking status: Never Smoker  . Smokeless tobacco: Never Used  Substance and Sexual Activity  . Alcohol use: Yes    Alcohol/week: 2.0 standard drinks    Types: 2 Shots of liquor per week    Comment: 2 to 3 times a week  . Drug use: No  . Sexual activity: Yes    Birth control/protection: None  Lifestyle  . Physical activity    Days per week: Not on file    Minutes per session: Not on file  . Stress: Not on file  Relationships  . Social Herbalist on phone: Not on file    Gets together: Not on file    Attends religious service: Not on file    Active member of club or organization: Not on file    Attends meetings of clubs or organizations: Not on file    Relationship status: Not on file  Other Topics Concern  . Not on file  Social History Narrative  . Not on file        Objective:  Physical Exam: BP 134/76 (BP Location: Left Arm,  Patient Position: Sitting, Cuff Size: Large)   Pulse 69   Temp 98.3 F (36.8 C) (Oral)   Ht 6' 1" (1.854 m)   Wt 252 lb 4 oz (114.4 kg)   SpO2 97%   BMI 33.28 kg/m   Body mass index is 33.28 kg/m. Wt Readings from Last 3 Encounters:  11/28/18 252 lb 4 oz (114.4 kg)  05/08/18 275 lb (124.7 kg)  09/06/17 273 lb (123.8 kg)   Gen: NAD, resting comfortably HEENT: TMs normal bilaterally. OP clear. No thyromegaly noted.  CV: RRR with no murmurs appreciated Pulm: NWOB, CTAB with no crackles, wheezes, or rhonchi GI: Normal bowel sounds present. Soft, Nontender, Nondistended. MSK: no edema, cyanosis, or clubbing noted Skin: warm, dry Neuro: CN2-12 grossly intact. Strength 5/5 in upper and lower extremities. Reflexes symmetric and intact bilaterally. No apparent cognitive impairment.  Psych: Normal affect and thought content     Caleb M. Jerline Pain, MD 11/28/2018 10:12 AM

## 2018-11-28 NOTE — Assessment & Plan Note (Signed)
Stable.  Continue over-the-counter antihistamines as needed. 

## 2018-11-28 NOTE — Assessment & Plan Note (Signed)
At goal.  Continue Norvasc 5 mg daily, metoprolol 50 mg twice daily, Imdur 30 mg daily, and HCTZ 12.5 mg daily.

## 2018-11-28 NOTE — Patient Instructions (Signed)
It was very nice to see you today!  Keep up the good work! We will check blood work today.  No other changes.  We will give you your pneumonia vaccine today.  Come back to see me in 1 year or sooner as needed.   Take care, Dr Jerline Pain  Please try these tips to maintain a healthy lifestyle:   Eat at least 3 REAL meals and 1-2 snacks per day.  Aim for no more than 5 hours between eating.  If you eat breakfast, please do so within one hour of getting up.    Obtain twice as many fruits/vegetables as protein or carbohydrate foods for both lunch and dinner. (Half of each meal should be fruits/vegetables, one quarter protein, and one quarter starchy carbs)   Cut down on sweet beverages. This includes juice, soda, and sweet tea.    Exercise at least 150 minutes every week.     Preventive Care 83 Years and Older, Male Preventive care refers to lifestyle choices and visits with your health care provider that can promote health and wellness. This includes:  A yearly physical exam. This is also called an annual well check.  Regular dental and eye exams.  Immunizations.  Screening for certain conditions.  Healthy lifestyle choices, such as diet and exercise. What can I expect for my preventive care visit? Physical exam Your health care provider will check:  Height and weight. These may be used to calculate body mass index (BMI), which is a measurement that tells if you are at a healthy weight.  Heart rate and blood pressure.  Your skin for abnormal spots. Counseling Your health care provider may ask you questions about:  Alcohol, tobacco, and drug use.  Emotional well-being.  Home and relationship well-being.  Sexual activity.  Eating habits.  History of falls.  Memory and ability to understand (cognition).  Work and work Statistician. What immunizations do I need?  Influenza (flu) vaccine  This is recommended every year. Tetanus, diphtheria, and pertussis  (Tdap) vaccine  You may need a Td booster every 10 years. Varicella (chickenpox) vaccine  You may need this vaccine if you have not already been vaccinated. Zoster (shingles) vaccine  You may need this after age 83. Pneumococcal conjugate (PCV13) vaccine  One dose is recommended after age 83. Pneumococcal polysaccharide (PPSV23) vaccine  One dose is recommended after age 83. Measles, mumps, and rubella (MMR) vaccine  You may need at least one dose of MMR if you were born in 1957 or later. You may also need a second dose. Meningococcal conjugate (MenACWY) vaccine  You may need this if you have certain conditions. Hepatitis A vaccine  You may need this if you have certain conditions or if you travel or work in places where you may be exposed to hepatitis A. Hepatitis B vaccine  You may need this if you have certain conditions or if you travel or work in places where you may be exposed to hepatitis B. Haemophilus influenzae type b (Hib) vaccine  You may need this if you have certain conditions. You may receive vaccines as individual doses or as more than one vaccine together in one shot (combination vaccines). Talk with your health care provider about the risks and benefits of combination vaccines. What tests do I need? Blood tests  Lipid and cholesterol levels. These may be checked every 5 years, or more frequently depending on your overall health.  Hepatitis C test.  Hepatitis B test. Screening  Lung cancer screening.  You may have this screening every year starting at age 83 if you have a 30-pack-year history of smoking and currently smoke or have quit within the past 15 years.  Colorectal cancer screening. All adults should have this screening starting at age 26 and continuing until age 14. Your health care provider may recommend screening at age 26 if you are at increased risk. You will have tests every 1-10 years, depending on your results and the type of screening test.   Prostate cancer screening. Recommendations will vary depending on your family history and other risks.  Diabetes screening. This is done by checking your blood sugar (glucose) after you have not eaten for a while (fasting). You may have this done every 1-3 years.  Abdominal aortic aneurysm (AAA) screening. You may need this if you are a current or former smoker.  Sexually transmitted disease (STD) testing. Follow these instructions at home: Eating and drinking  Eat a diet that includes fresh fruits and vegetables, whole grains, lean protein, and low-fat dairy products. Limit your intake of foods with high amounts of sugar, saturated fats, and salt.  Take vitamin and mineral supplements as recommended by your health care provider.  Do not drink alcohol if your health care provider tells you not to drink.  If you drink alcohol: ? Limit how much you have to 0-2 drinks a day. ? Be aware of how much alcohol is in your drink. In the [place] one drink equals one 12 oz bottle of beer (355 mL), one 5 oz glass of wine (148 mL), or one 1 oz glass of hard liquor (44 mL). Lifestyle  Take daily care of your teeth and gums.  Stay active. Exercise for at least 30 minutes on 5 or more days each week.  Do not use any products that contain nicotine or tobacco, such as cigarettes, e-cigarettes, and chewing tobacco. If you need help quitting, ask your health care provider.  If you are sexually active, practice safe sex. Use a condom or other form of protection to prevent STIs (sexually transmitted infections).  Talk with your health care provider about taking a low-dose aspirin or statin. What's next?  Visit your health care provider once a year for a well check visit.  Ask your health care provider how often you should have your eyes and teeth checked.  Stay up to date on all vaccines. This information is not intended to replace advice given to you by your health care provider. Make sure you  discuss any questions you have with your health care provider. Document Released: 05/08/2015 Document Revised: 04/05/2018 Document Reviewed: 04/05/2018 Elsevier Patient Education  2020 [employer].

## 2018-11-28 NOTE — Assessment & Plan Note (Signed)
Check A1c. 

## 2018-11-29 NOTE — Progress Notes (Signed)
Please inform patient of the following:  Blood work is all STABLE. Do not need to make any adjustments to his medications at this time. We can recheck again in a year.  Wyatt Mason. Jerline Pain, MD 11/29/2018 7:46 AM

## 2019-02-01 DIAGNOSIS — R531 Weakness: Secondary | ICD-10-CM | POA: Diagnosis not present

## 2019-02-01 DIAGNOSIS — W19XXXA Unspecified fall, initial encounter: Secondary | ICD-10-CM | POA: Diagnosis not present

## 2019-02-18 DIAGNOSIS — M25562 Pain in left knee: Secondary | ICD-10-CM | POA: Diagnosis not present

## 2019-02-26 DIAGNOSIS — M25562 Pain in left knee: Secondary | ICD-10-CM | POA: Diagnosis not present

## 2019-02-28 DIAGNOSIS — M25562 Pain in left knee: Secondary | ICD-10-CM | POA: Diagnosis not present

## 2019-03-05 DIAGNOSIS — M25562 Pain in left knee: Secondary | ICD-10-CM | POA: Diagnosis not present

## 2019-03-06 DIAGNOSIS — H02403 Unspecified ptosis of bilateral eyelids: Secondary | ICD-10-CM | POA: Diagnosis not present

## 2019-03-06 DIAGNOSIS — H02831 Dermatochalasis of right upper eyelid: Secondary | ICD-10-CM | POA: Diagnosis not present

## 2019-03-06 DIAGNOSIS — H11441 Conjunctival cysts, right eye: Secondary | ICD-10-CM | POA: Diagnosis not present

## 2019-03-06 DIAGNOSIS — H02834 Dermatochalasis of left upper eyelid: Secondary | ICD-10-CM | POA: Diagnosis not present

## 2019-03-06 DIAGNOSIS — Z961 Presence of intraocular lens: Secondary | ICD-10-CM | POA: Diagnosis not present

## 2019-03-07 DIAGNOSIS — M25562 Pain in left knee: Secondary | ICD-10-CM | POA: Diagnosis not present

## 2019-03-12 DIAGNOSIS — M25562 Pain in left knee: Secondary | ICD-10-CM | POA: Diagnosis not present

## 2019-03-14 DIAGNOSIS — M25562 Pain in left knee: Secondary | ICD-10-CM | POA: Diagnosis not present

## 2019-03-19 DIAGNOSIS — M1712 Unilateral primary osteoarthritis, left knee: Secondary | ICD-10-CM | POA: Diagnosis not present

## 2019-05-03 ENCOUNTER — Other Ambulatory Visit: Payer: Self-pay

## 2019-05-03 ENCOUNTER — Encounter: Payer: Self-pay | Admitting: Family Medicine

## 2019-05-03 ENCOUNTER — Ambulatory Visit (INDEPENDENT_AMBULATORY_CARE_PROVIDER_SITE_OTHER): Payer: Medicare Other | Admitting: Family Medicine

## 2019-05-03 VITALS — BP 124/64 | HR 69 | Temp 97.4°F | Ht 73.0 in | Wt 254.0 lb

## 2019-05-03 DIAGNOSIS — L723 Sebaceous cyst: Secondary | ICD-10-CM

## 2019-05-03 NOTE — Progress Notes (Signed)
   Wyatt Mason is a 86 y.o. male who presents today for an office visit.  Assessment/Plan:  New/Acute Problems: Inflamed sebaceous Cyst I&D performed today.  See below procedure note.  Tolerated well.  Can use over-the-counter analgesics as needed.  Discussed reasons return to care.    Subjective:  HPI:  Inflamed Cyst  Patient has had cyst on left back for several months.  Worsened within the last couple days.  Has become more inflamed and irritated.  No obvious precipitating events.       Objective:  Physical Exam: BP 124/64   Pulse 69   Temp (!) 97.4 F (36.3 C)   Ht 6\' 1"  (1.854 m)   Wt 254 lb (115.2 kg)   SpO2 97%   BMI 33.51 kg/m   Gen: No acute distress, resting comfortably Neuro: Grossly normal, moves all extremities Skin: Approximately 2 x 4 cm inflamed cyst on the left back. Psych: Normal affect and thought content  Sebaceous Cyst I&D with Excision Procedure Note  Pre-operative Diagnosis: Inflamed sebaceous cyst  Post-operative Diagnosis: Same  Locations: Left Back  Indications: Diagnostic and therapeutic  Anesthesia: Lidocaine 1% with epinephrine without added sodium bicarbonate  Procedure Details  History of allergy to iodine: No  Patient informed of the risks (including bleeding and infection) and benefits of the  procedure and Verbal informed consent obtained.  The lesion and surrounding area was given a sterile prep using betadyne and draped in the usual sterile fashion.  A 4 mm punch biopsy was used to penetrate the lesion.  Typical sebaceous material was then expressed from the incision site.  After successful expression of material, a hemostat was then inserted into the incision to break up any loculations. A hemostat was then used to gently remove the cyst wall from the incision.  Antibiotic ointment and a sterile dressing applied.  The patient tolerated the procedure well.  EBL: 4 ml  Findings: Inflamed sebaceous  cyst  Condition: Stable  Complications: none.  Plan: 1. Instructed to keep the wound dry and covered for 24-48h and clean thereafter. 2. Warning signs of infection were reviewed.   3. Recommended that the patient use OTC analgesics as needed for pain.      . Katina Degree, MD 05/03/2019 2:31 PM

## 2019-05-03 NOTE — Patient Instructions (Signed)
It was very nice to see you today!  We drained your cyst today.  I took out the cyst wall so hopefully will not come back.  Please leave the bandage in place for the next day or so.  You can use over-the-counter Tylenol or ibuprofen as needed.  Take care, Dr Jimmey Ralph  Please try these tips to maintain a healthy lifestyle:   Eat at least 3 REAL meals and 1-2 snacks per day.  Aim for no more than 5 hours between eating.  If you eat breakfast, please do so within one hour of getting up.    Each meal should contain half fruits/vegetables, one quarter protein, and one quarter carbs (no bigger than a computer mouse)   Cut down on sweet beverages. This includes juice, soda, and sweet tea.     Drink at least 1 glass of water with each meal and aim for at least 8 glasses per day   Exercise at least 150 minutes every week.

## 2019-05-08 ENCOUNTER — Encounter: Payer: Self-pay | Admitting: Physician Assistant

## 2019-05-08 ENCOUNTER — Other Ambulatory Visit: Payer: Self-pay

## 2019-05-08 ENCOUNTER — Ambulatory Visit (INDEPENDENT_AMBULATORY_CARE_PROVIDER_SITE_OTHER): Payer: Medicare Other | Admitting: Physician Assistant

## 2019-05-08 VITALS — BP 144/70 | HR 87 | Temp 98.3°F | Ht 73.0 in | Wt 255.5 lb

## 2019-05-08 DIAGNOSIS — L723 Sebaceous cyst: Secondary | ICD-10-CM

## 2019-05-08 MED ORDER — DOXYCYCLINE HYCLATE 100 MG PO TABS: 100.0000 mg | ORAL_TABLET | Freq: Two times a day (BID) | ORAL | 0 refills | Status: DC

## 2019-05-08 NOTE — Progress Notes (Signed)
Wyatt Mason is a 86 y.o. male is here to follow up on cyst Left shoulder.  I acted as a Education administrator for [employer], PA-C [employer], LPN  History of Present Illness:   Chief Complaint  Patient presents with  . sebaceous cyst    HPI    Sebaceous cyst Pt is 5 days post sebaceous I&D on 05/03/19 by Dr. Jerline Pain. Pt c/o pain left shoulder and draining a lot of tan/bloody discharge. States that last night was the "worst night so far" in terms of drainage and pain. He is taking Tylenol for the pain with some relief.   Denies: fevers, chills, n/v/d, malaise   There are no preventive care reminders to display for this patient.  Past Medical History:  Diagnosis Date  . CAD (coronary artery disease) 04/15/2013   a. s/p DES x 2 to mid OM, residual RCA and LAD borderline CAD. b. Nuc 12/2014 - low risk.  Marland Kitchen Hyperglycemia   . Hyperlipidemia   . Hypertension   . Obesity   . Shingles 2005     Social History   Socioeconomic History  . Marital status: Married    Spouse name: Not on file  . Number of children: Not on file  . Years of education: Not on file  . Highest education level: Not on file  Occupational History  . Not on file  Tobacco Use  . Smoking status: Never Smoker  . Smokeless tobacco: Never Used  Substance and Sexual Activity  . Alcohol use: Yes    Alcohol/week: 2.0 standard drinks    Types: 2 Shots of liquor per week    Comment: 2 to 3 times a week  . Drug use: No  . Sexual activity: Yes    Birth control/protection: None  Other Topics Concern  . Not on file  Social History Narrative  . Not on file   Social Determinants of Health   Financial Resource Strain:   . Difficulty of Paying Living Expenses: Not on file  Food Insecurity:   . Worried About Charity fundraiser in the Last Year: Not on file  . Ran Out of Food in the Last Year: Not on file  Transportation Needs:   . Lack of Transportation (Medical): Not on file  . Lack of Transportation  (Non-Medical): Not on file  Physical Activity:   . Days of Exercise per Week: Not on file  . Minutes of Exercise per Session: Not on file  Stress:   . Feeling of Stress : Not on file  Social Connections:   . Frequency of Communication with Friends and Family: Not on file  . Frequency of Social Gatherings with Friends and Family: Not on file  . Attends Religious Services: Not on file  . Active Member of Clubs or Organizations: Not on file  . Attends Archivist Meetings: Not on file  . Marital Status: Not on file  Intimate Partner Violence:   . Fear of Current or Ex-Partner: Not on file  . Emotionally Abused: Not on file  . Physically Abused: Not on file  . Sexually Abused: Not on file    Past Surgical History:  Procedure Laterality Date  . APPENDECTOMY    . CORONARY ANGIOPLASTY WITH STENT PLACEMENT  04/15/2013   sequential 90% then 95% mid OM lesions s/p DES x 2, residual 50% prox LAD and 50-70% mid RCA stenoses; LV gram deferred  . KNEE ARTHROSCOPY    . LEFT HEART CATHETERIZATION WITH CORONARY  ANGIOGRAM N/A 04/15/2013   Procedure: LEFT HEART CATHETERIZATION WITH CORONARY ANGIOGRAM;  Surgeon: Micheline Chapman, MD;  Location: [facility] CATH LAB;  Service: Cardiovascular;  Laterality: N/A;  . no colonoscopy     SOC reviewed 04/29/14  . TOTAL HIP ARTHROPLASTY Right 2005    Family History  Problem Relation Age of Onset  . Heart attack Father        onset in 30s; died @58   . CVA Neg Hx   . Cancer Neg Hx   . Diabetes Neg Hx   . Stroke Neg Hx     PMHx, SurgHx, SocialHx, FamHx, Medications, and Allergies were reviewed in the Visit Navigator and updated as appropriate.   Patient Active Problem List   Diagnosis Date Noted  . Allergic rhinitis 10/03/2014  . Hyperglycemia 04/24/2013  . CAD (coronary artery disease), native coronary artery 04/16/2013  . ONYCHOMYCOSIS, TOENAILS 12/22/2009  . HYPERLIPIDEMIA 03/23/2007  . POSTHERPETIC NEURALGIA 06/22/2006  . Essential  hypertension 06/22/2006    Social History   Tobacco Use  . Smoking status: Never Smoker  . Smokeless tobacco: Never Used  Substance Use Topics  . Alcohol use: Yes    Alcohol/week: 2.0 standard drinks    Types: 2 Shots of liquor per week    Comment: 2 to 3 times a week  . Drug use: No    Current Medications and Allergies:    Current Outpatient Medications:  .  amLODipine (NORVASC) 5 MG tablet, TAKE 1 TABLET BY MOUTH ONCE DAILY, Disp: 90 tablet, Rfl: 3 .  amoxicillin (AMOXIL) 500 MG capsule, Take 500 mg by mouth as needed (for Dental appointments). , Disp: , Rfl:  .  aspirin EC 81 MG tablet, Take 81 mg by mouth daily., Disp: , Rfl:  .  atorvastatin (LIPITOR) 80 MG tablet, TAKE 1 TABLET BY MOUTH ONCE DAILY, Disp: 90 tablet, Rfl: 3 .  Cholecalciferol (VITAMIN D-3) 1000 UNITS CAPS, Take 1 capsule by mouth daily. , Disp: , Rfl:  .  ezetimibe (ZETIA) 10 MG tablet, TAKE 1 TABLET BY MOUTH ONCE DAILY, Disp: 90 tablet, Rfl: 3 .  hydrochlorothiazide (MICROZIDE) 12.5 MG capsule, Take 1 capsule (12.5 mg total) by mouth daily., Disp: 90 capsule, Rfl: 3 .  isosorbide mononitrate (IMDUR) 30 MG 24 hr tablet, TAKE 1 TABLET BY MOUTH ONCE DAILY, Disp: 90 tablet, Rfl: 3 .  metoprolol tartrate (LOPRESSOR) 50 MG tablet, TAKE 1 TABLET BY MOUTH TWICE A DAY, Disp: 180 tablet, Rfl: 3 .  Multiple Vitamin (MULTIVITAMIN WITH MINERALS) TABS tablet, Take 1 tablet by mouth daily., Disp: , Rfl:  .  nitroGLYCERIN (NITROSTAT) 0.4 MG SL tablet, place 1 tablet under the tongue if needed every 5 minutes for 3 doses if needed for chest pain, Disp: 25 tablet, Rfl: 3 .  doxycycline (VIBRA-TABS) 100 MG tablet, Take 1 tablet (100 mg total) by mouth 2 (two) times daily., Disp: 20 tablet, Rfl: 0   Allergies  Allergen Reactions  . Other     Environmental allergies    Review of Systems   ROS  Negative unless otherwise specified per HPI.  Vitals:   Vitals:   05/08/19 1302  BP: (!) 144/70  Pulse: 87  Temp: 98.3 F  (36.8 C)  TempSrc: Temporal  SpO2: 95%  Weight: 255 lb 8 oz (115.9 kg)  Height: 6\' 1"  (1.854 m)     Body mass index is 33.71 kg/m.   Physical Exam:    Physical Exam Vitals and nursing note reviewed.  Constitutional:  General: He is not in acute distress.    Appearance: He is well-developed. He is not ill-appearing or toxic-appearing.  Cardiovascular:     Rate and Rhythm: Normal rate and regular rhythm.     Pulses: Normal pulses.     Heart sounds: Normal heart sounds, S1 normal and S2 normal.     Comments: No LE edema Pulmonary:     Effort: Pulmonary effort is normal.     Breath sounds: Normal breath sounds.  Skin:    General: Skin is warm and dry.     Comments: Prior punch biopsy hole with active purulent and serosanguinous drainage. Remains erythematous approximately 1 cm surrounding hole. Significant induration to 12 o'clock region. No fluctuance appreciated. Mild TTP.  Neurological:     Mental Status: He is alert.     GCS: GCS eye subscore is 4. GCS verbal subscore is 5. GCS motor subscore is 6.  Psychiatric:        Speech: Speech normal.        Behavior: Behavior normal. Behavior is cooperative.          Assessment and Plan:    Wyatt Mason was seen today for sebaceous cyst.  Diagnoses and all orders for this visit:  Inflamed sebaceous cyst No red flags on exam and patient in NAD. Gentle expression of drainage in office today without any issues. He declines medications for pain. Recommend starting antibiotics to cover for possible early infection and close follow-up with PCP on Friday for wound re-check. Patient verbalized understanding and in agreement.   Other orders -     doxycycline (VIBRA-TABS) 100 MG tablet; Take 1 tablet (100 mg total) by mouth 2 (two) times daily.   . Reviewed expectations re: course of current medical issues. . Discussed self-management of symptoms. . Outlined signs and symptoms indicating need for more acute  intervention. . Patient verbalized understanding and all questions were answered. . See orders for this visit as documented in the electronic medical record. . Patient received an After Visit Summary.  CMA or LPN served as scribe during this visit. History, Physical, and Plan performed by medical provider. The above documentation has been reviewed and is accurate and complete.   Jarold Motto, PA-C , [place] 05/08/2019  Follow-up: No follow-ups on file.

## 2019-05-08 NOTE — Patient Instructions (Signed)
It was great to see you!  It should continue to spontaneously drain, but lets keep a closer eye on it. Start the oral antibiotics and follow-up with Dr. Jimmey Ralph on Friday.  Take care,  Jarold Motto PA-C

## 2019-05-10 ENCOUNTER — Ambulatory Visit (INDEPENDENT_AMBULATORY_CARE_PROVIDER_SITE_OTHER): Payer: Medicare Other | Admitting: Family Medicine

## 2019-05-10 ENCOUNTER — Other Ambulatory Visit: Payer: Self-pay

## 2019-05-10 ENCOUNTER — Encounter: Payer: Self-pay | Admitting: Family Medicine

## 2019-05-10 VITALS — BP 162/78 | HR 74 | Temp 98.6°F | Ht 73.0 in | Wt 257.2 lb

## 2019-05-10 DIAGNOSIS — T8189XA Other complications of procedures, not elsewhere classified, initial encounter: Secondary | ICD-10-CM

## 2019-05-10 NOTE — Progress Notes (Addendum)
   Wyatt Mason is a 86 y.o. male who presents today for an office visit.  Assessment/Plan:  New/Acute Problems: Draining Wound Debrided pack today-see below procedure note.  He tolerated well.  We will leave packing in place for the next 3 to 5 days.  Encourage patient to change dressing at least 2-3 times per day.  Discussed warning signs for infection.  He will follow-up with me early next week if continues to have significant drainage.    Subjective:  HPI:  Patient seen 1 week ago for inflamed sebaceous cyst.  I&D was performed.  He tolerated well.  He followed up a few days later due to persistent drainage and pain.  Was started on antibiotics.  Symptoms seem to be improving though still has persistent drainage.       Objective:  Physical Exam: BP (!) 162/78   Pulse 74   Temp 98.6 F (37 C)   Ht 6\' 1"  (1.854 m)   Wt 257 lb 3.2 oz (116.7 kg)   SpO2 95%   BMI 33.93 kg/m   Gen: No acute distress, resting comfortably CV: Regular rate and rhythm with no murmurs appreciated Pulm: Normal work of breathing, clear to auscultation bilaterally with no crackles, wheezes, or rhonchi Neuro: Grossly normal, moves all extremities Psych: Normal affect and thought content Skin: Approximately 2 x 3 cm indurated area on left back with small amount of surrounding erythema.  4 mm open wound noted at the center of the indurated area.  Procedure Note Verbal informed consent was obtained.  His wound was expressed with removal of approximately 4 to 5 cc of purulent sebaceous material.  Patient had significant degree of pain.  Area was cleansed with alcohol pad and anesthetized with 2 cc of 1% lidocaine.  Necrotic tissue was debrided to with a max depth of 47mm to subcutaneous tissue.  Approximately 8 cm of iodoform gauze was then packed into his wound.  Area was bandaged with sterile gauze and tape.      1m. Katina Degree, MD 05/10/2019 12:21 PM

## 2019-05-10 NOTE — Patient Instructions (Signed)
It was very nice to see you today!  We packed your wound today.  Please change her dressing 2-3 times daily for the next several days.  You can remove the packing sometime early next week.  If you continue to have drainage by early next week, please come back for [place] to take another look.  Take care, Dr Jimmey Ralph  Please try these tips to maintain a healthy lifestyle:   Eat at least 3 REAL meals and 1-2 snacks per day.  Aim for no more than 5 hours between eating.  If you eat breakfast, please do so within one hour of getting up.    Each meal should contain half fruits/vegetables, one quarter protein, and one quarter carbs (no bigger than a computer mouse)   Cut down on sweet beverages. This includes juice, soda, and sweet tea.     Drink at least 1 glass of water with each meal and aim for at least 8 glasses per day   Exercise at least 150 minutes every week.

## 2019-05-27 ENCOUNTER — Ambulatory Visit: Payer: Medicare Other | Admitting: Cardiovascular Disease

## 2019-05-27 ENCOUNTER — Other Ambulatory Visit: Payer: Self-pay

## 2019-05-27 ENCOUNTER — Encounter: Payer: Self-pay | Admitting: Cardiovascular Disease

## 2019-05-27 VITALS — BP 140/80 | HR 69 | Ht 73.0 in | Wt 252.8 lb

## 2019-05-27 DIAGNOSIS — I10 Essential (primary) hypertension: Secondary | ICD-10-CM | POA: Diagnosis not present

## 2019-05-27 DIAGNOSIS — E782 Mixed hyperlipidemia: Secondary | ICD-10-CM

## 2019-05-27 DIAGNOSIS — I2510 Atherosclerotic heart disease of native coronary artery without angina pectoris: Secondary | ICD-10-CM | POA: Diagnosis not present

## 2019-05-27 NOTE — Progress Notes (Signed)
Cardiology Office Note:    Date:  05/27/2019   ID:  [name], DOB 09/25/1932, MRN [id number]  PCP:  Ardith Dark, MD  Cardiologist:  Tonny Bollman, MD  Electrophysiologist:  None   Referring MD: Ardith Dark, MD   Chief Complaint  Patient presents with  . Coronary Artery Disease    History of Present Illness:    Wyatt Mason is a 86 y.o. male with a hx of coronary artery disease, presenting for follow-up evaluation.  The patient presented in 2014 with a non-ST elevation MI and was treated with 2 drug-eluting stents in the obtuse marginal branch of the circumflex.  Comorbid conditions include hypertension and hyperlipidemia.  The patient is here alone today. Since he has been seen last, he had a knee arthroscopy. He's also had a cyst on the left shoulder that was lanced and it's still draining at times. He otherwise feels fine. Today, he denies symptoms of palpitations, chest pain, shortness of breath, orthopnea, PND, lower extremity edema, dizziness, or syncope.   Past Medical History:  Diagnosis Date  . CAD (coronary artery disease) 04/15/2013   a. s/p DES x 2 to mid OM, residual RCA and LAD borderline CAD. b. Nuc 12/2014 - low risk.  Marland Kitchen Hyperglycemia   . Hyperlipidemia   . Hypertension   . Obesity   . Shingles 2005    Past Surgical History:  Procedure Laterality Date  . APPENDECTOMY    . CORONARY ANGIOPLASTY WITH STENT PLACEMENT  04/15/2013   sequential 90% then 95% mid OM lesions s/p DES x 2, residual 50% prox LAD and 50-70% mid RCA stenoses; LV gram deferred  . KNEE ARTHROSCOPY    . LEFT HEART CATHETERIZATION WITH CORONARY ANGIOGRAM N/A 04/15/2013   Procedure: LEFT HEART CATHETERIZATION WITH CORONARY ANGIOGRAM;  Surgeon: Micheline Chapman, MD;  Location: [facility] CATH LAB;  Service: Cardiovascular;  Laterality: N/A;  . no colonoscopy     SOC reviewed 04/29/14  . TOTAL HIP ARTHROPLASTY Right 2005    Current Medications: Current Meds  Medication Sig  .  amLODipine (NORVASC) 5 MG tablet TAKE 1 TABLET BY MOUTH ONCE DAILY  . amoxicillin (AMOXIL) 500 MG capsule Take 500 mg by mouth as needed (for Dental appointments).   Marland Kitchen aspirin EC 81 MG tablet Take 81 mg by mouth daily.  Marland Kitchen atorvastatin (LIPITOR) 80 MG tablet TAKE 1 TABLET BY MOUTH ONCE DAILY  . Cholecalciferol (VITAMIN D-3) 1000 UNITS CAPS Take 1 capsule by mouth daily.   Marland Kitchen ezetimibe (ZETIA) 10 MG tablet TAKE 1 TABLET BY MOUTH ONCE DAILY  . isosorbide mononitrate (IMDUR) 30 MG 24 hr tablet TAKE 1 TABLET BY MOUTH ONCE DAILY  . metoprolol tartrate (LOPRESSOR) 50 MG tablet TAKE 1 TABLET BY MOUTH TWICE A DAY  . Multiple Vitamin (MULTIVITAMIN WITH MINERALS) TABS tablet Take 1 tablet by mouth daily.  . nitroGLYCERIN (NITROSTAT) 0.4 MG SL tablet place 1 tablet under the tongue if needed every 5 minutes for 3 doses if needed for chest pain     Allergies:   Other   Social History   Socioeconomic History  . Marital status: Married    Spouse name: Not on file  . Number of children: Not on file  . Years of education: Not on file  . Highest education level: Not on file  Occupational History  . Not on file  Tobacco Use  . Smoking status: Never Smoker  . Smokeless tobacco: Never Used  Substance and Sexual Activity  .  Alcohol use: Yes    Alcohol/week: 2.0 standard drinks    Types: 2 Shots of liquor per week    Comment: 2 to 3 times a week  . Drug use: No  . Sexual activity: Yes    Birth control/protection: None  Other Topics Concern  . Not on file  Social History Narrative  . Not on file   Social Determinants of Health   Financial Resource Strain:   . Difficulty of Paying Living Expenses: Not on file  Food Insecurity:   . Worried About Charity fundraiser in the Last Year: Not on file  . Ran Out of Food in the Last Year: Not on file  Transportation Needs:   . Lack of Transportation (Medical): Not on file  . Lack of Transportation (Non-Medical): Not on file  Physical Activity:   .  Days of Exercise per Week: Not on file  . Minutes of Exercise per Session: Not on file  Stress:   . Feeling of Stress : Not on file  Social Connections:   . Frequency of Communication with Friends and Family: Not on file  . Frequency of Social Gatherings with Friends and Family: Not on file  . Attends Religious Services: Not on file  . Active Member of Clubs or Organizations: Not on file  . Attends Archivist Meetings: Not on file  . Marital Status: Not on file     Family History: The patient's family history includes Heart attack in his father. There is no history of CVA, Cancer, Diabetes, or Stroke.  ROS:   Please see the history of present illness.    All other systems reviewed and are negative.  EKGs/Labs/Other Studies Reviewed:    EKG:  EKG is ordered today.  The ekg ordered today demonstrates NSR 65 bpm, within normal limits  Recent Labs: 11/28/2018: ALT 11; BUN 17; Creatinine, Ser 0.70; Hemoglobin 12.6; Platelets 196.0; Potassium 4.2; Sodium 140; TSH 1.44  Recent Lipid Panel    Component Value Date/Time   CHOL 203 (H) 11/28/2018 1000   CHOL 180 09/13/2017 0803   TRIG 101.0 11/28/2018 1000   HDL 72.20 11/28/2018 1000   HDL 78 09/13/2017 0803   CHOLHDL 3 11/28/2018 1000   VLDL 20.2 11/28/2018 1000   LDLCALC 111 (H) 11/28/2018 1000   LDLCALC 85 09/13/2017 0803    Physical Exam:    VS:  BP 140/80   Pulse 69   Ht 6\' 1"  (1.854 m)   Wt 252 lb 12.8 oz (114.7 kg)   SpO2 96%   BMI 33.35 kg/m     Wt Readings from Last 3 Encounters:  05/27/19 252 lb 12.8 oz (114.7 kg)  05/10/19 257 lb 3.2 oz (116.7 kg)  05/08/19 255 lb 8 oz (115.9 kg)    GEN:  Well nourished, well developed in no acute distress HEENT: Normal NECK: No JVD; No carotid bruits LYMPHATICS: No lymphadenopathy CARDIAC: RRR, no murmurs, rubs, gallops RESPIRATORY:  Clear to auscultation without rales, wheezing or rhonchi  ABDOMEN: Soft, non-tender, non-distended MUSCULOSKELETAL:  No edema; No  deformity  SKIN: Warm and dry NEUROLOGIC:  Alert and oriented x 3 PSYCHIATRIC:  Normal affect   ASSESSMENT:    1. Coronary artery disease involving native coronary artery of native heart without angina pectoris   2. Essential hypertension   3. Mixed hyperlipidemia    PLAN:    In order of problems listed above:  1. The patient is stable without symptoms of angina.  He will  continue on his current medical program which includes amlodipine, isosorbide, and metoprolol for antianginal therapy, aspirin for antiplatelet therapy, and a high intensity statin drug. 2. Blood pressure is controlled on isosorbide, metoprolol, and amlodipine. 3. Treated with a high intensity statin drug and Zetia 10 mg daily.  Overall he appears to be doing well from a cardiac perspective.  He had substernal chest pressure back in 2014 when he presented with ACS.  He understands to seek immediate medical attention if any of these symptoms recur.  Medication Adjustments/Labs and Tests Ordered: Current medicines are reviewed at length with the patient today.  Concerns regarding medicines are outlined above.  No orders of the defined types were placed in this encounter.  No orders of the defined types were placed in this encounter.   There are no Patient Instructions on file for this visit.   Signed, Tonny Bollman, MD  05/27/2019 2:11 PM    [facility]

## 2019-06-06 ENCOUNTER — Other Ambulatory Visit: Payer: Self-pay

## 2019-06-07 ENCOUNTER — Encounter: Payer: Self-pay | Admitting: Family Medicine

## 2019-06-07 ENCOUNTER — Ambulatory Visit (INDEPENDENT_AMBULATORY_CARE_PROVIDER_SITE_OTHER): Payer: Medicare Other | Admitting: Family Medicine

## 2019-06-07 VITALS — BP 142/80 | HR 70 | Temp 97.0°F | Ht 73.0 in | Wt 251.2 lb

## 2019-06-07 DIAGNOSIS — L723 Sebaceous cyst: Secondary | ICD-10-CM

## 2019-06-07 NOTE — Progress Notes (Signed)
   Wyatt Mason is a 86 y.o. male who presents today for an office visit.  Assessment/Plan:  New/Acute Problems: Sebaceous Cyst I&D performed today.  See below procedure note.  Tolerated well.    Subjective:  HPI:  Patient is here today with complain of draining cyst.  He would like to have it drained today.  He has had similar cyst in the past.  Has had to have surgery in the past due to sebaceous cyst.       Objective:  Physical Exam: BP (!) 142/80   Pulse 70   Temp (!) 97 F (36.1 C)   Ht 6\' 1"  (1.854 m)   Wt 251 lb 4 oz (114 kg)   SpO2 98%   BMI 33.15 kg/m   Gen: No acute distress, resting comfortably Skin: 3cm cyst on right flank with serosanguinous drainage.  Sebaceous Cyst I&D with Excision Procedure Note  Pre-operative Diagnosis: Inflamed sebaceous cyst  Post-operative Diagnosis: Same  Locations: Right Back  Indications: Diagnostic and therapeutic  Anesthesia: Lidocaine 1% with epinephrine without added sodium bicarbonate  Procedure Details  History of allergy to iodine: No  Patient informed of the risks (including bleeding and infection) and benefits of the  procedure and Written informed consent obtained.  The lesion and surrounding area was given a sterile prep using betadyne and draped in the usual sterile fashion.  A 4 mm punch biopsy was used to penetrate the lesion.  Typical sebaceous material was then expressed from the incision site.  After successful expression of material, a hemostat was then inserted into the incision to break up any loculations.  Wound was packed with iodoform gauze.  Antibiotic ointment and a sterile dressing applied. The patient tolerated the procedure well.  EBL: 2 ml  Findings: Sebaceous cyst  Condition: Stable  Complications: none.  Plan: 1. Instructed to keep the wound dry and covered for 24-48h and clean thereafter. 2. Warning signs of infection were reviewed.   3. Recommended that the patient use OTC  analgesics as needed for pain.       . Katina Degree, MD 06/07/2019 11:35 AM

## 2019-06-07 NOTE — Patient Instructions (Signed)
It was very nice to see you today!  Please take the packing out in 5-7 days.  Take care, Dr Jimmey Ralph  Please try these tips to maintain a healthy lifestyle:   Eat at least 3 REAL meals and 1-2 snacks per day.  Aim for no more than 5 hours between eating.  If you eat breakfast, please do so within one hour of getting up.    Each meal should contain half fruits/vegetables, one quarter protein, and one quarter carbs (no bigger than a computer mouse)   Cut down on sweet beverages. This includes juice, soda, and sweet tea.     Drink at least 1 glass of water with each meal and aim for at least 8 glasses per day   Exercise at least 150 minutes every week.

## 2019-07-31 ENCOUNTER — Other Ambulatory Visit: Payer: Self-pay | Admitting: Specialist

## 2019-07-31 DIAGNOSIS — Z96641 Presence of right artificial hip joint: Secondary | ICD-10-CM | POA: Diagnosis not present

## 2019-07-31 DIAGNOSIS — M545 Low back pain, unspecified: Secondary | ICD-10-CM

## 2019-07-31 DIAGNOSIS — M25552 Pain in left hip: Secondary | ICD-10-CM | POA: Diagnosis not present

## 2019-07-31 DIAGNOSIS — Z471 Aftercare following joint replacement surgery: Secondary | ICD-10-CM | POA: Diagnosis not present

## 2019-08-01 ENCOUNTER — Other Ambulatory Visit: Payer: Self-pay

## 2019-08-01 ENCOUNTER — Ambulatory Visit
Admission: RE | Admit: 2019-08-01 | Discharge: 2019-08-01 | Disposition: A | Discharge status: Home / Self Care | Payer: Medicare Other | Source: Ambulatory Visit | Attending: Specialist | Admitting: Specialist

## 2019-08-01 DIAGNOSIS — M545 Low back pain, unspecified: Secondary | ICD-10-CM

## 2019-08-01 DIAGNOSIS — S32010A Wedge compression fracture of first lumbar vertebra, initial encounter for closed fracture: Secondary | ICD-10-CM | POA: Diagnosis not present

## 2019-08-08 DIAGNOSIS — M545 Low back pain: Secondary | ICD-10-CM | POA: Diagnosis not present

## 2019-08-08 DIAGNOSIS — S32009A Unspecified fracture of unspecified lumbar vertebra, initial encounter for closed fracture: Secondary | ICD-10-CM | POA: Diagnosis not present

## 2019-08-08 DIAGNOSIS — M5136 Other intervertebral disc degeneration, lumbar region: Secondary | ICD-10-CM | POA: Diagnosis not present

## 2019-09-05 DIAGNOSIS — S32009A Unspecified fracture of unspecified lumbar vertebra, initial encounter for closed fracture: Secondary | ICD-10-CM | POA: Diagnosis not present

## 2019-10-17 ENCOUNTER — Other Ambulatory Visit: Payer: Self-pay | Admitting: Cardiovascular Disease

## 2019-10-17 DIAGNOSIS — S32009A Unspecified fracture of unspecified lumbar vertebra, initial encounter for closed fracture: Secondary | ICD-10-CM | POA: Diagnosis not present

## 2019-10-17 DIAGNOSIS — M545 Low back pain: Secondary | ICD-10-CM | POA: Diagnosis not present

## 2019-10-17 DIAGNOSIS — M5136 Other intervertebral disc degeneration, lumbar region: Secondary | ICD-10-CM | POA: Diagnosis not present

## 2019-11-20 DIAGNOSIS — M25561 Pain in right knee: Secondary | ICD-10-CM | POA: Diagnosis not present

## 2019-11-20 DIAGNOSIS — M25562 Pain in left knee: Secondary | ICD-10-CM | POA: Diagnosis not present

## 2019-11-20 DIAGNOSIS — S32010D Wedge compression fracture of first lumbar vertebra, subsequent encounter for fracture with routine healing: Secondary | ICD-10-CM | POA: Diagnosis not present

## 2019-11-20 DIAGNOSIS — M545 Low back pain: Secondary | ICD-10-CM | POA: Diagnosis not present

## 2019-11-20 DIAGNOSIS — M170 Bilateral primary osteoarthritis of knee: Secondary | ICD-10-CM | POA: Diagnosis not present

## 2019-12-02 ENCOUNTER — Ambulatory Visit (INDEPENDENT_AMBULATORY_CARE_PROVIDER_SITE_OTHER): Payer: Medicare Other

## 2019-12-02 VITALS — Wt 237.0 lb

## 2019-12-02 DIAGNOSIS — Z0000 Encounter for general adult medical examination without abnormal findings: Secondary | ICD-10-CM | POA: Diagnosis not present

## 2019-12-02 NOTE — Progress Notes (Signed)
Virtual Visit via Telephone Note  I connected with  Wyatt Mason on 12/02/19 at  8:45 AM EDT by telephone and verified that I am speaking with the correct person using two identifiers.  Medicare Annual Wellness visit completed telephonically due to Covid-19 pandemic.   Persons participating in this call: This Health Coach and this patient.   Location: Patient: Home Provider: Office   I discussed the limitations, risks, security and privacy concerns of performing an evaluation and management service by telephone and the availability of in person appointments. The patient expressed understanding and agreed to proceed.  Unable to perform video visit due to video visit attempted and failed and/or patient does not have video capability.   Some vital signs may be absent or patient reported.   Marzella Schlein, LPN    Subjective:   Wyatt Mason is a 86 y.o. male who presents for Medicare Annual/Subsequent preventive examination.  Review of Systems     Cardiac Risk Factors include: hypertension;male gender;dyslipidemia     Objective:    There were no vitals filed for this visit. There is no height or weight on file to calculate BMI.  Advanced Directives 12/02/2019 05/12/2015 01/12/2015 04/14/2013  Does Patient Have a Medical Advance Directive? Yes Yes Yes Patient has advance directive, copy not in chart  Type of Advance Directive Healthcare Power of [place];Living will - Living will Living will  Does patient want to make changes to medical advance directive? - - No - Patient declined No  Copy of Healthcare Power of Attorney in Chart? No - copy requested Yes No - copy requested Copy requested from family  Pre-existing out of facility DNR order (yellow form or pink MOST form) - - - No    Current Medications (verified) Outpatient Encounter Medications as of 12/02/2019  Medication Sig  . amLODipine (NORVASC) 5 MG tablet TAKE 1 TABLET BY MOUTH EVERY DAY  . aspirin EC 81 MG tablet  Take 81 mg by mouth daily.  Marland Kitchen atorvastatin (LIPITOR) 80 MG tablet TAKE 1 TABLET BY MOUTH ONCE DAILY  . Cholecalciferol (VITAMIN D-3) 1000 UNITS CAPS Take 1 capsule by mouth daily.   Marland Kitchen ezetimibe (ZETIA) 10 MG tablet TAKE 1 TABLET BY MOUTH EVERY DAY  . isosorbide mononitrate (IMDUR) 30 MG 24 hr tablet TAKE 1 TABLET BY MOUTH EVERY DAY  . metoprolol tartrate (LOPRESSOR) 50 MG tablet TAKE 1 TABLET BY MOUTH TWICE DAILY  . Multiple Vitamin (MULTIVITAMIN WITH MINERALS) TABS tablet Take 1 tablet by mouth daily.  . nitroGLYCERIN (NITROSTAT) 0.4 MG SL tablet place 1 tablet under the tongue if needed every 5 minutes for 3 doses if needed for chest pain  . amoxicillin (AMOXIL) 500 MG capsule Take 500 mg by mouth as needed (for Dental appointments).  (Patient not taking: Reported on 12/02/2019)   No facility-administered encounter medications on file as of 12/02/2019.    Allergies (verified) Other   History: Past Medical History:  Diagnosis Date  . CAD (coronary artery disease) 04/15/2013   a. s/p DES x 2 to mid OM, residual RCA and LAD borderline CAD. b. Nuc 12/2014 - low risk.  Marland Kitchen Hyperglycemia   . Hyperlipidemia   . Hypertension   . Obesity   . Shingles 2005   Past Surgical History:  Procedure Laterality Date  . APPENDECTOMY    . CORONARY ANGIOPLASTY WITH STENT PLACEMENT  04/15/2013   sequential 90% then 95% mid OM lesions s/p DES x 2, residual 50% prox LAD and 50-70% mid  RCA stenoses; LV gram deferred  . KNEE ARTHROSCOPY    . LEFT HEART CATHETERIZATION WITH CORONARY ANGIOGRAM N/A 04/15/2013   Procedure: LEFT HEART CATHETERIZATION WITH CORONARY ANGIOGRAM;  Surgeon: Micheline ChapmanMichael D Cooper, MD;  [facility];  Service: Cardiovascular;  Laterality: N/A;  . no colonoscopy     SOC reviewed 04/29/14  . TOTAL HIP ARTHROPLASTY Right 2005   Family History  Problem Relation Age of Onset  . Heart attack Father        onset in 30s; died @58   . CVA Neg Hx   . Cancer Neg Hx   . Diabetes Neg Hx   .  Stroke Neg Hx    Social History   Socioeconomic History  . Marital status: Married    Spouse name: Not on file  . Number of children: Not on file  . Years of education: Not on file  . Highest education level: Not on file  Occupational History  . Occupation: Retired  Tobacco Use  . Smoking status: Never Smoker  . Smokeless tobacco: Never Used  Vaping Use  . Vaping Use: Never used  Substance and Sexual Activity  . Alcohol use: Yes    Alcohol/week: 2.0 standard drinks    Types: 2 Shots of liquor per week    Comment: 2 to 3 times a week  . Drug use: No  . Sexual activity: Yes    Birth control/protection: None  Other Topics Concern  . Not on file  Social History Narrative  . Not on file   Social Determinants of Health   Financial Resource Strain: Low Risk   . Difficulty of Paying Living Expenses: Not hard at all  Food Insecurity: No Food Insecurity  . Worried About Programme researcher, broadcasting/film/videounning Out of Food in the Last Year: Never true  . Ran Out of Food in the Last Year: Never true  Transportation Needs: No Transportation Needs  . Lack of Transportation (Medical): No  . Lack of Transportation (Non-Medical): No  Physical Activity: Insufficiently Active  . Days of Exercise per Week: 3 days  . Minutes of Exercise per Session: 30 min  Stress: No Stress Concern Present  . Feeling of Stress : Not at all  Social Connections: Moderately Integrated  . Frequency of Communication with Friends and Family: More than three times a week  . Frequency of Social Gatherings with Friends and Family: Never  . Attends Religious Services: More than 4 times per year  . Active Member of Clubs or Organizations: No  . Attends BankerClub or Organization Meetings: Never  . Marital Status: Married    Tobacco Counseling Counseling given: Not Answered   Clinical Intake:  Pre-visit preparation completed: Yes  Pain : No/denies pain     BMI - recorded: 33.16 Nutritional Status: BMI > 30  Obese Diabetes: No  How  often do you need to have someone help you when you read instructions, pamphlets, or other written materials from your doctor or pharmacy?: 1 - Never  Diabetic?No  Interpreter Needed?: No  Information entered by :: Lanier Ensignina Channah Godeaux, LPN   Activities of Daily Living In your present state of health, do you have any difficulty performing the following activities: 12/02/2019  Hearing? N  Vision? N  Difficulty concentrating or making decisions? N  Walking or climbing stairs? N  Dressing or bathing? N  Doing errands, shopping? N  Preparing Food and eating ? N  Using the Toilet? N  In the past six months, have you accidently leaked urine?  N  Do you have problems with loss of bowel control? N  Managing your Medications? N  Managing your Finances? N  Housekeeping or managing your Housekeeping? N  Some recent data might be hidden    Patient Care Team: Ardith Dark, MD as PCP - General (Family Medicine) Tonny Bollman, MD as PCP - Cardiology (Cardiology)  Indicate any recent Medical Services you may have received from other than [facility] providers in the past year (date may be approximate).     Assessment:   This is a routine wellness examination for [place].  Hearing/Vision screen  Hearing Screening   125Hz  250Hz  500Hz  1000Hz  2000Hz  3000Hz  4000Hz  6000Hz  8000Hz   Right ear:           Left ear:           Comments: Pt denies any difficulty hearing   Vision Screening Comments: Followed up with [facility]/Dr for annual eye exams   Dietary issues and exercise activities discussed: Current Exercise Habits: Home exercise routine, Type of exercise: walking, Time (Minutes): 30, Frequency (Times/Week): 3, Weekly Exercise (Minutes/Week): 90, Intensity: Mild  Goals    .  patient (pt-stated)      Plan to go to the [place] more often;     .  Patient Stated      Stay healthy      Depression Screen PHQ 2/9 Scores 12/02/2019 11/28/2018 09/06/2017 05/12/2015 04/29/2014 05/06/2013 04/24/2013  PHQ -  2 Score 0 0 2 0 0 0 0  PHQ- 9 Score - - 5 - - - -    Fall Risk Fall Risk  12/02/2019 11/28/2018 09/06/2017 05/12/2015 04/29/2014  Falls in the past year? 1 0 No No No  Number falls in past yr: 1 - - - -  Injury with Fall? 1 - - - -  Comment compression FX to back - - - -  Risk for fall due to : History of fall(s);Impaired vision - - - -  Follow up Falls prevention discussed - - - -    Any stairs in or around the home? Yes  If so, are there any without handrails? No  Home free of loose throw rugs in walkways, pet beds, electrical cords, etc? Yes  Adequate lighting in your home to reduce risk of falls? Yes   ASSISTIVE DEVICES UTILIZED TO PREVENT FALLS:  Life alert? No  Use of a cane, walker or w/c? No  Grab bars in the bathroom? Yes  Shower chair or bench in shower? No  Elevated toilet seat or a handicapped toilet? Yes   TIMED UP AND GO:  Was the test performed? No .    Cognitive Function:     6CIT Screen 12/02/2019  What Year? 0 points  What month? 0 points  Count back from 20 0 points  Months in reverse 0 points  Repeat phrase 0 points    Immunizations Immunization History  Administered Date(s) Administered  . Influenza, Seasonal, Injecte, Preservative Fre 12/14/2018  . Influenza,inj,Quad PF,6+ Mos 01/12/2015  . Influenza,inj,quad, With Preservative 01/23/2017, 01/22/2018, 12/14/2018  . Influenza-Unspecified 01/23/2013, 01/21/2016  . PFIZER SARS-COV-2 Vaccination 05/19/2019, 06/06/2019  . Pneumococcal Conjugate-13 11/28/2018  . Pneumococcal Polysaccharide-23 01/12/2015  . Td 06/24/2002  . Tetanus 09/02/2014    TDAP status: Up to date Flu Vaccine status: Up to date Pneumococcal vaccine status: Up to date Covid-19 vaccine status: Completed vaccines  Qualifies for Shingles Vaccine? Yes   Zostavax completed No   Shingrix Completed?: No.    Education  has been provided regarding the importance of this vaccine. Patient has been advised to call insurance company to  determine out of pocket expense if they have not yet received this vaccine. Advised may also receive vaccine at local pharmacy or Health Dept. Verbalized acceptance and understanding.  Screening Tests Health Maintenance  Topic Date Due  . INFLUENZA VACCINE  11/24/2019  . TETANUS/TDAP  09/01/2024  . COVID-19 Vaccine  Completed  . PNA vac Low Risk Adult  Completed    Health Maintenance  Health Maintenance Due  Topic Date Due  . INFLUENZA VACCINE  11/24/2019    Colorectal cancer screening: No longer required.    Additional Screening:   Vision Screening: Recommended annual ophthalmology exams for early detection of glaucoma and other disorders of the eye. Is the patient up to date with their annual eye exam?  Yes  Who is the provider or what is the name of the office in which the patient attends annual eye exams? [facility]/ Dr mark G   Dental Screening: Recommended annual dental exams for proper oral hygiene  Community Resource Referral / Chronic Care Management: CRR required this visit?  No   CCM required this visit?  No      Plan:     I have personally reviewed and noted the following in the patient's chart:   . Medical and social history . Use of alcohol, tobacco or illicit drugs  . Current medications and supplements . Functional ability and status . Nutritional status . Physical activity . Advanced directives . List of other physicians . Hospitalizations, surgeries, and ER visits in previous 12 months . Vitals . Screenings to include cognitive, depression, and falls . Referrals and appointments  In addition, I have reviewed and discussed with patient certain preventive protocols, quality metrics, and best practice recommendations. A written personalized care plan for preventive services as well as general preventive health recommendations were provided to patient.     Marzella Schlein, LPN   04/26/4578   Nurse Notes: None

## 2019-12-02 NOTE — Patient Instructions (Addendum)
Mr. Wyatt Mason , Thank you for taking time to come for your Medicare Wellness Visit. I appreciate your ongoing commitment to your health goals. Please review the following plan we discussed and let me know if I can assist you in the future.   Screening recommendations/referrals: Colonoscopy: No longer required  Recommended yearly ophthalmology/optometry visit for glaucoma screening and checkup Recommended yearly dental visit for hygiene and checkup  Vaccinations: Influenza vaccine: Up to date Pneumococcal vaccine: Up to date Tdap vaccine: Up to date Shingles vaccine: Shingrix discussed. Please contact your pharmacy for coverage information.     Covid-19: Completed 1/24 & 06/06/19  Advanced directives: Please bring a copy of your health care power of attorney and living will to the office at your convenience.   Conditions/risks identified: Stay healthy  Next appointment: Follow up in one year for your annual wellness visit.   Preventive Care 35 Years and Older, Male Preventive care refers to lifestyle choices and visits with your health care provider that can promote health and wellness. What does preventive care include?  A yearly physical exam. This is also called an annual well check.  Dental exams once or twice a year.  Routine eye exams. Ask your health care provider how often you should have your eyes checked.  Personal lifestyle choices, including:  Daily care of your teeth and gums.  Regular physical activity.  Eating a healthy diet.  Avoiding tobacco and drug use.  Limiting alcohol use.  Practicing safe sex.  Taking low doses of aspirin every day.  Taking vitamin and mineral supplements as recommended by your health care provider. What happens during an annual well check? The services and screenings done by your health care provider during your annual well check will depend on your age, overall health, lifestyle risk factors, and family history of  disease. Counseling  Your health care provider may ask you questions about your:  Alcohol use.  Tobacco use.  Drug use.  Emotional well-being.  Home and relationship well-being.  Sexual activity.  Eating habits.  History of falls.  Memory and ability to understand (cognition).  Work and work Astronomer. Screening  You may have the following tests or measurements:  Height, weight, and BMI.  Blood pressure.  Lipid and cholesterol levels. These may be checked every 5 years, or more frequently if you are over 27 years old.  Skin check.  Lung cancer screening. You may have this screening every year starting at age 83 if you have a 30-pack-year history of smoking and currently smoke or have quit within the past 15 years.  Fecal occult blood test (FOBT) of the stool. You may have this test every year starting at age 16.  Flexible sigmoidoscopy or colonoscopy. You may have a sigmoidoscopy every 5 years or a colonoscopy every 10 years starting at age 59.  Prostate cancer screening. Recommendations will vary depending on your family history and other risks.  Hepatitis C blood test.  Hepatitis B blood test.  Sexually transmitted disease (STD) testing.  Diabetes screening. This is done by checking your blood sugar (glucose) after you have not eaten for a while (fasting). You may have this done every 1-3 years.  Abdominal aortic aneurysm (AAA) screening. You may need this if you are a current or former smoker.  Osteoporosis. You may be screened starting at age 47 if you are at high risk. Talk with your health care provider about your test results, treatment options, and if necessary, the need for more tests. Vaccines  Your health care provider may recommend certain vaccines, such as:  Influenza vaccine. This is recommended every year.  Tetanus, diphtheria, and acellular pertussis (Tdap, Td) vaccine. You may need a Td booster every 10 years.  Zoster vaccine. You may  need this after age 54.  Pneumococcal 13-valent conjugate (PCV13) vaccine. One dose is recommended after age 45.  Pneumococcal polysaccharide (PPSV23) vaccine. One dose is recommended after age 101. Talk to your health care provider about which screenings and vaccines you need and how often you need them. This information is not intended to replace advice given to you by your health care provider. Make sure you discuss any questions you have with your health care provider. Document Released: 05/08/2015 Document Revised: 12/30/2015 Document Reviewed: 02/10/2015 Elsevier Interactive Patient Education  2017 [place] Prevention in the Home Falls can cause injuries. They can happen to people of all ages. There are many things you can do to make your home safe and to help prevent falls. What can I do on the outside of my home?  Regularly fix the edges of walkways and driveways and fix any cracks.  Remove anything that might make you trip as you walk through a door, such as a raised step or threshold.  Trim any bushes or trees on the path to your home.  Use bright outdoor lighting.  Clear any walking paths of anything that might make someone trip, such as rocks or tools.  Regularly check to see if handrails are loose or broken. Make sure that both sides of any steps have handrails.  Any raised decks and porches should have guardrails on the edges.  Have any leaves, snow, or ice cleared regularly.  Use sand or salt on walking paths during winter.  Clean up any spills in your garage right away. This includes oil or grease spills. What can I do in the bathroom?  Use night lights.  Install grab bars by the toilet and in the tub and shower. Do not use towel bars as grab bars.  Use non-skid mats or decals in the tub or shower.  If you need to sit down in the shower, use a plastic, non-slip stool.  Keep the floor dry. Clean up any water that spills on the floor as soon as it  happens.  Remove soap buildup in the tub or shower regularly.  Attach bath mats securely with double-sided non-slip rug tape.  Do not have throw rugs and other things on the floor that can make you trip. What can I do in the bedroom?  Use night lights.  Make sure that you have a light by your bed that is easy to reach.  Do not use any sheets or blankets that are too big for your bed. They should not hang down onto the floor.  Have a firm chair that has side arms. You can use this for support while you get dressed.  Do not have throw rugs and other things on the floor that can make you trip. What can I do in the kitchen?  Clean up any spills right away.  Avoid walking on wet floors.  Keep items that you use a lot in easy-to-reach places.  If you need to reach something above you, use a strong step stool that has a grab bar.  Keep electrical cords out of the way.  Do not use floor polish or wax that makes floors slippery. If you must use wax, use non-skid floor wax.  Do  not have throw rugs and other things on the floor that can make you trip. What can I do with my stairs?  Do not leave any items on the stairs.  Make sure that there are handrails on both sides of the stairs and use them. Fix handrails that are broken or loose. Make sure that handrails are as long as the stairways.  Check any carpeting to make sure that it is firmly attached to the stairs. Fix any carpet that is loose or worn.  Avoid having throw rugs at the top or bottom of the stairs. If you do have throw rugs, attach them to the floor with carpet tape.  Make sure that you have a light switch at the top of the stairs and the bottom of the stairs. If you do not have them, ask someone to add them for you. What else can I do to help prevent falls?  Wear shoes that:  Do not have high heels.  Have rubber bottoms.  Are comfortable and fit you well.  Are closed at the toe. Do not wear sandals.  If you  use a stepladder:  Make sure that it is fully opened. Do not climb a closed stepladder.  Make sure that both sides of the stepladder are locked into place.  Ask someone to hold it for you, if possible.  Clearly mark and make sure that you can see:  Any grab bars or handrails.  First and last steps.  Where the edge of each step is.  Use tools that help you move around (mobility aids) if they are needed. These include:  Canes.  Walkers.  Scooters.  Crutches.  Turn on the lights when you go into a dark area. Replace any light bulbs as soon as they burn out.  Set up your furniture so you have a clear path. Avoid moving your furniture around.  If any of your floors are uneven, fix them.  If there are any pets around you, be aware of where they are.  Review your medicines with your doctor. Some medicines can make you feel dizzy. This can increase your chance of falling. Ask your doctor what other things that you can do to help prevent falls. This information is not intended to replace advice given to you by your health care provider. Make sure you discuss any questions you have with your health care provider. Document Released: 02/05/2009 Document Revised: 09/17/2015 Document Reviewed: 05/16/2014 Elsevier Interactive Patient Education  2017 [employer].

## 2019-12-06 DIAGNOSIS — M545 Low back pain: Secondary | ICD-10-CM | POA: Diagnosis not present

## 2019-12-09 DIAGNOSIS — M545 Low back pain: Secondary | ICD-10-CM | POA: Diagnosis not present

## 2019-12-11 DIAGNOSIS — M545 Low back pain: Secondary | ICD-10-CM | POA: Diagnosis not present

## 2019-12-17 DIAGNOSIS — M545 Low back pain: Secondary | ICD-10-CM | POA: Diagnosis not present

## 2019-12-26 DIAGNOSIS — M545 Low back pain: Secondary | ICD-10-CM | POA: Diagnosis not present

## 2020-01-01 DIAGNOSIS — M545 Low back pain: Secondary | ICD-10-CM | POA: Diagnosis not present

## 2020-01-02 DIAGNOSIS — M545 Low back pain: Secondary | ICD-10-CM | POA: Diagnosis not present

## 2020-04-06 ENCOUNTER — Other Ambulatory Visit: Payer: Self-pay | Admitting: Cardiovascular Disease

## 2020-04-29 DIAGNOSIS — M7061 Trochanteric bursitis, right hip: Secondary | ICD-10-CM | POA: Diagnosis not present

## 2020-05-26 DIAGNOSIS — M25551 Pain in right hip: Secondary | ICD-10-CM | POA: Diagnosis not present

## 2020-05-31 NOTE — Progress Notes (Signed)
Cardiology Office Note:    Date:  06/02/2020   ID:  [name], DOB Apr 08, 1933, MRN [id number]  PCP:  Ardith Dark, MD  [facility] Cardiologist:  Tonny Bollman, MD  [facility] Electrophysiologist:  None   Referring MD: Ardith Dark, MD   Chief Complaint  Patient presents with  . Coronary Artery Disease    History of Present Illness:    Wyatt Mason is a 87 y.o. male with a hx of coronary artery disease, presenting for follow-up evaluation.  The patient presented in 2014 with a non-ST elevation MI and was treated with 2 drug-eluting stents in the obtuse marginal branch of the circumflex.  Comorbid conditions include hypertension and hyperlipidemia.  In the past year he required left knee surgery and had a lumbar compression fracture which was managed conservatively with physical therapy. His wife has had progressive memory loss.   Today, he denies symptoms of palpitations, chest pain, shortness of breath, orthopnea, PND, lower extremity edema, dizziness, or syncope. Reports home BP's run 130-150/80's. He has intentionally lost 35 pounds in the past 2 years.   Past Medical History:  Diagnosis Date  . CAD (coronary artery disease) 04/15/2013   a. s/p DES x 2 to mid OM, residual RCA and LAD borderline CAD. b. Nuc 12/2014 - low risk.  Marland Kitchen Hyperglycemia   . Hyperlipidemia   . Hypertension   . Obesity   . Shingles 2005    Past Surgical History:  Procedure Laterality Date  . APPENDECTOMY    . CORONARY ANGIOPLASTY WITH STENT PLACEMENT  04/15/2013   sequential 90% then 95% mid OM lesions s/p DES x 2, residual 50% prox LAD and 50-70% mid RCA stenoses; LV gram deferred  . KNEE ARTHROSCOPY    . LEFT HEART CATHETERIZATION WITH CORONARY ANGIOGRAM N/A 04/15/2013   Procedure: LEFT HEART CATHETERIZATION WITH CORONARY ANGIOGRAM;  Surgeon: Micheline Chapman, MD;  Location: [facility] CATH LAB;  Service: Cardiovascular;  Laterality: N/A;  . no colonoscopy     SOC reviewed 04/29/14  .  TOTAL HIP ARTHROPLASTY Right 2005    Current Medications: Current Meds  Medication Sig  . amLODipine (NORVASC) 5 MG tablet TAKE 1 TABLET BY MOUTH EVERY DAY  . amoxicillin (AMOXIL) 500 MG capsule Take 500 mg by mouth as needed (for Dental appointments).  Marland Kitchen aspirin EC 81 MG tablet Take 81 mg by mouth daily.  Marland Kitchen atorvastatin (LIPITOR) 80 MG tablet TAKE 1 TABLET BY MOUTH EVERY DAY  . Cholecalciferol (VITAMIN D-3) 1000 UNITS CAPS Take 1 capsule by mouth daily.   Marland Kitchen ezetimibe (ZETIA) 10 MG tablet TAKE 1 TABLET BY MOUTH EVERY DAY  . isosorbide mononitrate (IMDUR) 30 MG 24 hr tablet TAKE 1 TABLET BY MOUTH EVERY DAY  . metoprolol tartrate (LOPRESSOR) 50 MG tablet TAKE 1 TABLET BY MOUTH TWICE DAILY  . Multiple Vitamin (MULTIVITAMIN WITH MINERALS) TABS tablet Take 1 tablet by mouth daily.  . [DISCONTINUED] nitroGLYCERIN (NITROSTAT) 0.4 MG SL tablet place 1 tablet under the tongue if needed every 5 minutes for 3 doses if needed for chest pain     Allergies:   Other   Social History   Socioeconomic History  . Marital status: Married    Spouse name: Not on file  . Number of children: Not on file  . Years of education: Not on file  . Highest education level: Not on file  Occupational History  . Occupation: Retired  Tobacco Use  . Smoking status: Never Smoker  .  Smokeless tobacco: Never Used  Vaping Use  . Vaping Use: Never used  Substance and Sexual Activity  . Alcohol use: Yes    Alcohol/week: 2.0 standard drinks    Types: 2 Shots of liquor per week    Comment: 2 to 3 times a week  . Drug use: No  . Sexual activity: Yes    Birth control/protection: None  Other Topics Concern  . Not on file  Social History Narrative  . Not on file   Social Determinants of Health   Financial Resource Strain: Low Risk   . Difficulty of Paying Living Expenses: Not hard at all  Food Insecurity: No Food Insecurity  . Worried About Programme researcher, broadcasting/film/video in the Last Year: Never true  . Ran Out of Food in  the Last Year: Never true  Transportation Needs: No Transportation Needs  . Lack of Transportation (Medical): No  . Lack of Transportation (Non-Medical): No  Physical Activity: Insufficiently Active  . Days of Exercise per Week: 3 days  . Minutes of Exercise per Session: 30 min  Stress: No Stress Concern Present  . Feeling of Stress : Not at all  Social Connections: Moderately Integrated  . Frequency of Communication with Friends and Family: More than three times a week  . Frequency of Social Gatherings with Friends and Family: Never  . Attends Religious Services: More than 4 times per year  . Active Member of Clubs or Organizations: No  . Attends Banker Meetings: Never  . Marital Status: Married     Family History: The patient's family history includes Heart attack in his father. There is no history of CVA, Cancer, Diabetes, or Stroke.  ROS:   Please see the history of present illness.    All other systems reviewed and are negative.  EKGs/Labs/Other Studies Reviewed:    EKG:  EKG is ordered today.  The ekg ordered today demonstrates 80 bpm, within normal limits  Recent Labs: 06/01/2020: ALT 15; BUN 14; Creatinine, Ser 0.72; Hemoglobin 13.4; Platelets 181; Potassium 4.5; Sodium 140  Recent Lipid Panel    Component Value Date/Time   CHOL 203 (H) 11/28/2018 1000   CHOL 180 09/13/2017 0803   TRIG 101.0 11/28/2018 1000   HDL 72.20 11/28/2018 1000   HDL 78 09/13/2017 0803   CHOLHDL 3 11/28/2018 1000   VLDL 20.2 11/28/2018 1000   LDLCALC 111 (H) 11/28/2018 1000   LDLCALC 85 09/13/2017 0803     Risk Assessment/Calculations:       Physical Exam:    VS:  BP 130/90   Pulse 80   Ht 6' (1.829 m)   Wt 239 lb 12.8 oz (108.8 kg)   SpO2 96%   BMI 32.52 kg/m     Wt Readings from Last 3 Encounters:  06/01/20 239 lb 12.8 oz (108.8 kg)  12/02/19 237 lb (107.5 kg)  06/07/19 251 lb 4 oz (114 kg)     GEN:  Well nourished, well developed in no acute  distress HEENT: Normal NECK: No JVD; No carotid bruits LYMPHATICS: No lymphadenopathy CARDIAC: RRR, no murmurs, rubs, gallops RESPIRATORY:  Clear to auscultation without rales, wheezing or rhonchi  ABDOMEN: Soft, non-tender, non-distended MUSCULOSKELETAL:  No edema; No deformity  SKIN: Warm and dry NEUROLOGIC:  Alert and oriented x 3 PSYCHIATRIC:  Normal affect   ASSESSMENT:    1. Coronary artery disease involving native coronary artery of native heart without angina pectoris   2. Essential hypertension   3. Mixed hyperlipidemia  PLAN:    In order of problems listed above:  1. The patient is stable and doing well at present.  He does not have any symptoms of angina.  He is treated with amlodipine, aspirin, isosorbide, metoprolol, and a high intensity statin drug. 2. Blood pressure is borderline.  See blood pressure readings from home described above.  Would continue current therapy.  He is doing a good job with weight loss and diet. 3. Treated with atorvastatin 80 mg and Zetia 10 mg.  Update labs.  The patient is doing well.  His last labs were in 2020.  Will update a CBC, metabolic panel, and lipid panel.  I will see him back in 1 year for follow-up evaluation.  He will keep an eye on his blood pressure at home.  Medication Adjustments/Labs and Tests Ordered: Current medicines are reviewed at length with the patient today.  Concerns regarding medicines are outlined above.  Orders Placed This Encounter  Procedures  . CBC with Differential/Platelet  . Comprehensive metabolic panel  . Hepatic function panel  . EKG 12-Lead   Meds ordered this encounter  Medications  . nitroGLYCERIN (NITROSTAT) 0.4 MG SL tablet    Sig: place 1 tablet under the tongue if needed every 5 minutes for 3 doses if needed for chest pain    Dispense:  25 tablet    Refill:  3    Patient Instructions  Medication Instructions:  Your provider recommends that you continue on your current medications as  directed. Please refer to the Current Medication list given to you today.   *If you need a refill on your cardiac medications before your next appointment, please call your pharmacy*  Follow-Up: At [facility], you and your health needs are our priority.  As part of our continuing mission to provide you with exceptional heart care, we have created designated Provider Care Teams.  These Care Teams include your primary Cardiologist (physician) and Advanced Practice Providers (APPs -  Physician Assistants and Nurse Practitioners) who all work together to provide you with the care you need, when you need it. Your next appointment:   12 month(s) The format for your next appointment:   In Person Provider:   You may see Tonny Bollman, MD or one of the following Advanced Practice Providers on your designated Care Team:    Tereso Newcomer, PA-C  Chelsea Aus, [place]      Signed, Tonny Bollman, MD  06/02/2020 5:12 PM    [facility]

## 2020-06-01 ENCOUNTER — Ambulatory Visit: Payer: Medicare Other | Admitting: Cardiovascular Disease

## 2020-06-01 ENCOUNTER — Other Ambulatory Visit: Payer: Self-pay

## 2020-06-01 ENCOUNTER — Encounter: Payer: Self-pay | Admitting: Cardiovascular Disease

## 2020-06-01 VITALS — BP 130/90 | HR 80 | Ht 72.0 in | Wt 239.8 lb

## 2020-06-01 DIAGNOSIS — E782 Mixed hyperlipidemia: Secondary | ICD-10-CM

## 2020-06-01 DIAGNOSIS — I2510 Atherosclerotic heart disease of native coronary artery without angina pectoris: Secondary | ICD-10-CM | POA: Diagnosis not present

## 2020-06-01 DIAGNOSIS — I10 Essential (primary) hypertension: Secondary | ICD-10-CM | POA: Diagnosis not present

## 2020-06-01 LAB — CBC WITH DIFFERENTIAL/PLATELET
Basophils Absolute: 0 10*3/uL (ref 0.0–0.2)
Basos: 1 %
EOS (ABSOLUTE): 0.3 10*3/uL (ref 0.0–0.4)
Eos: 6 %
Hematocrit: 38.5 % (ref 37.5–51.0)
Hemoglobin: 13.4 g/dL (ref 13.0–17.7)
Immature Grans (Abs): 0 10*3/uL (ref 0.0–0.1)
Immature Granulocytes: 0 %
Lymphocytes Absolute: 1.7 10*3/uL (ref 0.7–3.1)
Lymphs: 35 %
MCH: 31 pg (ref 26.6–33.0)
MCHC: 34.8 g/dL (ref 31.5–35.7)
MCV: 89 fL (ref 79–97)
Monocytes Absolute: 0.5 10*3/uL (ref 0.1–0.9)
Monocytes: 10 %
Neutrophils Absolute: 2.4 10*3/uL (ref 1.4–7.0)
Neutrophils: 48 %
Platelets: 181 10*3/uL (ref 150–450)
RBC: 4.32 x10E6/uL (ref 4.14–5.80)
RDW: 11.6 % (ref 11.6–15.4)
WBC: 4.9 10*3/uL (ref 3.4–10.8)

## 2020-06-01 LAB — COMPREHENSIVE METABOLIC PANEL
ALT: 15 IU/L (ref 0–44)
AST: 21 IU/L (ref 0–40)
Albumin/Globulin Ratio: 2 (ref 1.2–2.2)
Albumin: 4.5 g/dL (ref 3.6–4.6)
Alkaline Phosphatase: 78 IU/L (ref 44–121)
BUN/Creatinine Ratio: 19 (ref 10–24)
BUN: 14 mg/dL (ref 8–27)
Bilirubin Total: 0.7 mg/dL (ref 0.0–1.2)
CO2: 27 mmol/L (ref 20–29)
Calcium: 9 mg/dL (ref 8.6–10.2)
Chloride: 100 mmol/L (ref 96–106)
Creatinine, Ser: 0.72 mg/dL — ABNORMAL LOW (ref 0.76–1.27)
GFR calc Af Amer: 98 mL/min/{1.73_m2} (ref >59)
GFR calc non Af Amer: 85 mL/min/{1.73_m2} (ref >59)
Globulin, Total: 2.3 g/dL (ref 1.5–4.5)
Glucose: 110 mg/dL — ABNORMAL HIGH (ref 65–99)
Potassium: 4.5 mmol/L (ref 3.5–5.2)
Sodium: 140 mmol/L (ref 134–144)
Total Protein: 6.8 g/dL (ref 6.0–8.5)

## 2020-06-01 LAB — HEPATIC FUNCTION PANEL: Bilirubin, Direct: 0.21 mg/dL (ref 0.00–0.40)

## 2020-06-01 MED ORDER — NITROGLYCERIN 0.4 MG SL SUBL: SUBLINGUAL_TABLET | SUBLINGUAL | 3 refills | Status: DC

## 2020-06-01 NOTE — Patient Instructions (Signed)

## 2020-06-03 DIAGNOSIS — M25551 Pain in right hip: Secondary | ICD-10-CM | POA: Diagnosis not present

## 2020-06-10 DIAGNOSIS — M25551 Pain in right hip: Secondary | ICD-10-CM | POA: Diagnosis not present

## 2020-07-17 DIAGNOSIS — M7061 Trochanteric bursitis, right hip: Secondary | ICD-10-CM | POA: Diagnosis not present

## 2020-12-07 ENCOUNTER — Ambulatory Visit: Payer: Medicare Other

## 2021-01-09 ENCOUNTER — Other Ambulatory Visit: Payer: Self-pay | Admitting: Cardiovascular Disease

## 2021-06-07 NOTE — Progress Notes (Signed)
Cardiology Office Note:    Date:  06/08/2021   ID:  Wyatt Mason, DOB December 22, 1932, MRN [id number]  PCP:  Wyatt Barrack, MD  [facility] Providers Cardiologist:  Wyatt Mocha, MD    Referring MD: Wyatt Barrack, MD   Chief Complaint:  F/u for CAD    Patient Profile: Coronary artery disease  NSTEMI s/p DES x 2 to OM in 2014 Hypertension  Hyperlipidemia  Hx of lumbar compression fx S/p L knee surgery  Right Bundle Branch Block   Prior CV Studies: Nuclear stress test 01/12/15 EF 55-65; This study is significantly affected by artifacts causing decreased uptake in the basal and mid inferior and inferolateral walls and apical wall at rest, however they all improve with stress.  There is no ischemia. Low risk study.    Echo 04/16/13 Mild LVH, EF 55-60, Gr 1 DD, mild BAE   Cardiac Catheterization 04/15/13 LM mild calcification LAD prox 50 with heavy calcification OM mid 90, then sequential 95 RCA mid 50-70 with heavy calcification PCI: 4 x 12 mm Promus Premier DES x 2 to OM    History of Present Illness:   Wyatt Mason is a 88 y.o. male with the above problem list.  He was last seen by Dr. Burt Mason in 2/22.  He returns for f/u.  He is here alone.  He is doing well without chest pain, shortness of breath, syncope.  He is having difficulty with weakness and losing his balance.  He did fall several weeks ago.  He notes falling off of his steps.  He did not suffer any significant injury.  He has some mild lower extremity edema.  He has not had orthopnea.       Past Medical History:  Diagnosis Date   CAD (coronary artery disease) 04/15/2013   a. s/p DES x 2 to mid OM, residual RCA and LAD borderline CAD. b. Nuc 12/2014 - low risk.   Hyperglycemia    Hyperlipidemia    Hypertension    Obesity    Right bundle branch block 06/08/2021   Shingles 2005   Current Medications: Current Meds  Medication Sig   amoxicillin (AMOXIL) 500 MG capsule Take 500 mg by mouth as needed  (for Dental appointments).   aspirin EC 81 MG tablet Take 81 mg by mouth daily.   atorvastatin (LIPITOR) 80 MG tablet TAKE 1 TABLET BY MOUTH EVERY DAY   Cholecalciferol (VITAMIN D-3) 1000 UNITS CAPS Take 1 capsule by mouth daily.    ezetimibe (ZETIA) 10 MG tablet TAKE 1 TABLET BY MOUTH EVERY DAY   isosorbide mononitrate (IMDUR) 30 MG 24 hr tablet TAKE 1 TABLET BY MOUTH EVERY DAY   metoprolol tartrate (LOPRESSOR) 50 MG tablet TAKE 1 TABLET BY MOUTH TWICE DAILY   Multiple Vitamin (MULTIVITAMIN WITH MINERALS) TABS tablet Take 1 tablet by mouth daily.   nitroGLYCERIN (NITROSTAT) 0.4 MG SL tablet place 1 tablet under the tongue if needed every 5 minutes for 3 doses if needed for chest pain   [DISCONTINUED] amLODipine (NORVASC) 5 MG tablet TAKE 1 TABLET BY MOUTH EVERY DAY    Allergies:   Other   Social History   Tobacco Use   Smoking status: Never   Smokeless tobacco: Never  Vaping Use   Vaping Use: Never used  Substance Use Topics   Alcohol use: Yes    Alcohol/week: 2.0 standard drinks    Types: 2 Shots of liquor per week    Comment: 2 to 3 times  a week   Drug use: No    Family Hx: The patient's family history includes Heart attack in his father. There is no history of CVA, Cancer, Diabetes, or Stroke.  Review of Systems  Neurological:  Positive for loss of balance (+Fall several weeks ago - fell backwards off steps).    EKGs/Labs/Other Test Reviewed:    EKG:  EKG is   ordered today.  The ekg ordered today demonstrates NSR, HR 76, normal axis, RBBB, QTc 468  Recent Labs: No results found for requested labs within last 8760 hours.   Recent Lipid Panel No results for input(s): CHOL, TRIG, HDL, VLDL, LDLCALC, LDLDIRECT in the last 8760 hours.   Risk Assessment/Calculations:         Physical Exam:    VS:  BP (!) 164/80 (BP Location: Left Arm, Patient Position: Sitting, Cuff Size: Normal)    Pulse 76    Ht 6' (1.829 m)    Wt 240 lb 9.6 oz (109.1 kg)    SpO2 96%    BMI 32.63  kg/m     Wt Readings from Last 3 Encounters:  06/08/21 240 lb 9.6 oz (109.1 kg)  06/01/20 239 lb 12.8 oz (108.8 kg)  12/02/19 237 lb (107.5 kg)    Constitutional:      Appearance: Healthy appearance. Not in distress.  Neck:     Vascular: No JVR. JVD normal.  Pulmonary:     Effort: Pulmonary effort is normal.     Breath sounds: No wheezing. No rales.  Cardiovascular:     Normal rate. Regular rhythm. Normal S1. Normal S2.      Murmurs: There is a grade 1/6 systolic murmur at the URSB.  Edema:    Peripheral edema present.    Ankle: bilateral 1+ edema of the ankle. Abdominal:     Palpations: Abdomen is soft.  Skin:    General: Skin is warm and dry.  Neurological:     General: No focal deficit present.     Mental Status: Alert and oriented to person, place and time.     Cranial Nerves: Cranial nerves are intact.        ASSESSMENT & PLAN:   CAD (coronary artery disease), native coronary artery History of non-STEMI in 2014 she has a DES x2 to the OM.  He had moderate nonobstructive disease elsewhere at that time.  Stress test in 2016 was low risk.  He is currently doing well without anginal symptoms on his current medical regimen.  Continue amlodipine, isosorbide mononitrate 30 mg daily, metoprolol tartrate 50 mg twice daily, atorvastatin 80 mg daily, aspirin 81 mg daily.  Right bundle branch block This appears to be new.  He is not having any symptoms to suggest angina at this time.  We discussed proceeding with an echocardiogram +/- nuclear stress test.  He prefers to have the echocardiogram only. Arrange echocardiogram to rule out structural heart disease  Essential hypertension Blood pressure is uncontrolled.  Increase amlodipine to 10 mg daily.  Continue isosorbide mononitrate 30 mg daily, metoprolol tartrate 50 mg twice daily.  Obtain CMET today.  HYPERLIPIDEMIA Continue atorvastatin 80 mg daily.  Obtain fasting CMET, lipids today.  Impairment of balance He notes issues  with his balance.  He has fallen on at least a couple of occasions.  He suffered a lumbar compression fracture in 2021.  I have recommended he follow-up with primary care for possible referral to physical therapy.  Dispo:  Return in about 6 months (around 12/06/2021) for Routine Follow Up, w/ Dr. Burt Mason..   Medication Adjustments/Labs and Tests Ordered: Current medicines are reviewed at length with the patient today.  Concerns regarding medicines are outlined above.  Tests Ordered: Orders Placed This Encounter  Procedures   Comp Met (CMET)   Lipid Profile   EKG 12-Lead   ECHOCARDIOGRAM COMPLETE   Medication Changes: Meds ordered this encounter  Medications   amLODipine (NORVASC) 10 MG tablet    Sig: Take 1 tablet (10 mg total) by mouth daily.    Dispense:  90 tablet    Refill:  3   Signed, Richardson Dopp, PA-C  06/08/2021 10:04 AM    [address], [place], [place]  [address] Phone: [phone]; Fax: [phone]

## 2021-06-08 ENCOUNTER — Ambulatory Visit: Payer: Medicare Other | Admitting: Physician Assistant

## 2021-06-08 ENCOUNTER — Other Ambulatory Visit: Payer: Self-pay

## 2021-06-08 ENCOUNTER — Encounter: Payer: Self-pay | Admitting: Physician Assistant

## 2021-06-08 VITALS — BP 164/80 | HR 76 | Ht 72.0 in | Wt 240.6 lb

## 2021-06-08 DIAGNOSIS — I25119 Atherosclerotic heart disease of native coronary artery with unspecified angina pectoris: Secondary | ICD-10-CM | POA: Diagnosis not present

## 2021-06-08 DIAGNOSIS — I10 Essential (primary) hypertension: Secondary | ICD-10-CM | POA: Diagnosis not present

## 2021-06-08 DIAGNOSIS — R2689 Other abnormalities of gait and mobility: Secondary | ICD-10-CM | POA: Diagnosis not present

## 2021-06-08 DIAGNOSIS — E782 Mixed hyperlipidemia: Secondary | ICD-10-CM

## 2021-06-08 DIAGNOSIS — I4510 Unspecified right bundle-branch block: Secondary | ICD-10-CM | POA: Diagnosis not present

## 2021-06-08 HISTORY — DX: Unspecified right bundle-branch block: I45.10

## 2021-06-08 LAB — COMPREHENSIVE METABOLIC PANEL
ALT: 12 IU/L (ref 0–44)
AST: 18 IU/L (ref 0–40)
Albumin/Globulin Ratio: 1.8 (ref 1.2–2.2)
Albumin: 4.3 g/dL (ref 3.6–4.6)
Alkaline Phosphatase: 70 IU/L (ref 44–121)
BUN/Creatinine Ratio: 20 (ref 10–24)
BUN: 16 mg/dL (ref 8–27)
Bilirubin Total: 1 mg/dL (ref 0.0–1.2)
CO2: 28 mmol/L (ref 20–29)
Calcium: 9.1 mg/dL (ref 8.6–10.2)
Chloride: 101 mmol/L (ref 96–106)
Creatinine, Ser: 0.8 mg/dL (ref 0.76–1.27)
Globulin, Total: 2.4 g/dL (ref 1.5–4.5)
Glucose: 114 mg/dL — ABNORMAL HIGH (ref 70–99)
Potassium: 4.3 mmol/L (ref 3.5–5.2)
Sodium: 141 mmol/L (ref 134–144)
Total Protein: 6.7 g/dL (ref 6.0–8.5)
eGFR: 86 mL/min/{1.73_m2} (ref >59)

## 2021-06-08 LAB — LIPID PANEL
Chol/HDL Ratio: 2.6 ratio (ref 0.0–5.0)
Cholesterol, Total: 210 mg/dL — ABNORMAL HIGH (ref 100–199)
HDL: 82 mg/dL (ref >39)
LDL Chol Calc (NIH): 109 mg/dL — ABNORMAL HIGH (ref 0–99)
Triglycerides: 109 mg/dL (ref 0–149)
VLDL Cholesterol Cal: 19 mg/dL (ref 5–40)

## 2021-06-08 MED ORDER — AMLODIPINE BESYLATE 10 MG PO TABS: 10.0000 mg | ORAL_TABLET | Freq: Every day | ORAL | 3 refills | Status: DC

## 2021-06-08 NOTE — Assessment & Plan Note (Signed)
Continue atorvastatin 80 mg daily.  Obtain fasting CMET, lipids today. 

## 2021-06-08 NOTE — Assessment & Plan Note (Signed)
History of non-STEMI in 2014 she has a DES x2 to the OM.  He had moderate nonobstructive disease elsewhere at that time.  Stress test in 2016 was low risk.  He is currently doing well without anginal symptoms on his current medical regimen.  Continue amlodipine, isosorbide mononitrate 30 mg daily, metoprolol tartrate 50 mg twice daily, atorvastatin 80 mg daily, aspirin 81 mg daily.

## 2021-06-08 NOTE — Assessment & Plan Note (Signed)
He notes issues with his balance.  He has fallen on at least a couple of occasions.  He suffered a lumbar compression fracture in 2021.  I have recommended he follow-up with primary care for possible referral to physical therapy.

## 2021-06-08 NOTE — Assessment & Plan Note (Signed)
This appears to be new.  He is not having any symptoms to suggest angina at this time.  We discussed proceeding with an echocardiogram +/- nuclear stress test.  He prefers to have the echocardiogram only.  Arrange echocardiogram to rule out structural heart disease

## 2021-06-08 NOTE — Assessment & Plan Note (Signed)
Blood pressure is uncontrolled.  Increase amlodipine to 10 mg daily.  Continue isosorbide mononitrate 30 mg daily, metoprolol tartrate 50 mg twice daily.  Obtain CMET today.

## 2021-06-08 NOTE — Patient Instructions (Signed)
Medication Instructions:   INCREASE Amlodipine one (1) tablet by mouth ( 10 mg) daily.  You can two of your (5mg ) tablets at the same time to use them up.   *If you need a refill on your cardiac medications before your next appointment, please call your pharmacy*   Lab Work:  TODAY!!!  CMET/LIPID  If you have labs (blood work) drawn today and your tests are completely normal, you will receive your results only by: [place] (if you have MyChart) OR A paper copy in the mail If you have any lab test that is abnormal or we need to change your treatment, we will call you to review the results.   Testing/Procedures:  Your physician has requested that you have an echocardiogram. Echocardiography is a painless test that uses sound waves to create images of your heart. It provides your doctor with information about the size and shape of your heart and how well your hearts chambers and valves are working. This procedure takes approximately one hour. There are no restrictions for this procedure.    Follow-Up: At [facility], you and your health needs are our priority.  As part of our continuing mission to provide you with exceptional heart care, we have created designated Provider Care Teams.  These Care Teams include your primary Cardiologist (physician) and Advanced Practice Providers (APPs -  Physician Assistants and Nurse Practitioners) who all work together to provide you with the care you need, when you need it.  We recommend signing up for the patient portal called "MyChart".  Sign up information is provided on this After Visit Summary.  MyChart is used to connect with patients for Virtual Visits (Telemedicine).  Patients are able to view lab/test results, encounter notes, upcoming appointments, etc.  Non-urgent messages can be sent to your provider as well.   To learn more about what you can do with MyChart, go to [email or web address].    Your next appointment:   6  month(s)  The format for your next appointment:   In Person  Provider:   Sherren Mocha, MD

## 2021-06-09 ENCOUNTER — Other Ambulatory Visit: Payer: Self-pay | Admitting: Cardiovascular Disease

## 2021-06-11 ENCOUNTER — Telehealth: Payer: Self-pay

## 2021-06-11 ENCOUNTER — Ambulatory Visit (HOSPITAL_COMMUNITY): Payer: Medicare Other | Attending: Internal Medicine

## 2021-06-11 ENCOUNTER — Other Ambulatory Visit: Payer: Self-pay

## 2021-06-11 ENCOUNTER — Encounter: Payer: Self-pay | Admitting: Physician Assistant

## 2021-06-11 DIAGNOSIS — I10 Essential (primary) hypertension: Secondary | ICD-10-CM | POA: Insufficient documentation

## 2021-06-11 DIAGNOSIS — Z79899 Other long term (current) drug therapy: Secondary | ICD-10-CM

## 2021-06-11 DIAGNOSIS — E782 Mixed hyperlipidemia: Secondary | ICD-10-CM | POA: Diagnosis not present

## 2021-06-11 DIAGNOSIS — I7121 Aneurysm of the ascending aorta, without rupture: Secondary | ICD-10-CM

## 2021-06-11 DIAGNOSIS — I25119 Atherosclerotic heart disease of native coronary artery with unspecified angina pectoris: Secondary | ICD-10-CM | POA: Insufficient documentation

## 2021-06-11 DIAGNOSIS — I351 Nonrheumatic aortic (valve) insufficiency: Secondary | ICD-10-CM

## 2021-06-11 HISTORY — DX: Aneurysm of the ascending aorta, without rupture: I71.21

## 2021-06-11 HISTORY — DX: Nonrheumatic aortic (valve) insufficiency: I35.1

## 2021-06-11 LAB — ECHOCARDIOGRAM COMPLETE
Area-P 1/2: 2.5 cm2
P 1/2 time: 393 msec
S' Lateral: 3.2 cm

## 2021-06-11 MED ORDER — ROSUVASTATIN CALCIUM 40 MG PO TABS: 40.0000 mg | ORAL_TABLET | Freq: Every day | ORAL | 3 refills | Status: DC

## 2021-06-11 MED ORDER — METOPROLOL TARTRATE 50 MG PO TABS: 50.0000 mg | ORAL_TABLET | Freq: Two times a day (BID) | ORAL | 3 refills | Status: DC

## 2021-06-11 NOTE — Telephone Encounter (Signed)
Called and relayed results of labs and ECHO to patient. He is aware of need for CT scan and that someone will be calling him to schedule. Pt also told results of recent labs with need to change Lipitor to Crestor. New rx sent to pharmacy at this time, along with the Metoprolol refill that pt requested over the phone. Repeat labs ordered as well (lipids and lft's)   Wyatt Lecher, PA-C  06/09/2021 11:55 AM EST Back to Top    Glucose mildly elevated.  Creatinine, K+, LFTs normal.  LDL above goal.  Goal LDL <13. I think we should try to switch him from atorvastatin to rosuvastatin, which is more potent. PLAN:  -DC atorvastatin -Start rosuvastatin 40 mg daily. -Fasting lipids, LFTs 3 months Wyatt Newcomer, PA-C    06/09/2021 11:53 AM     Order placed for BMET for CTA to be done

## 2021-06-11 NOTE — Telephone Encounter (Signed)
-----   Message from Liliane Shi, [place] sent at 06/11/2021 12:50 PM EST ----- Echocardiogram shows:  EF normal.  Mild to mod AI.  Ascending thoracic aortic aneurysm.   PLAN:  -Continue current medications/treatment plan and follow up as scheduled.  -Arrange chest/aorta CTA (Dx thoracic aortic aneurysm) -Send copy to PCP -Repeat echocardiogram 1 year.  Richardson Dopp, PA-C    06/11/2021 12:46 PM

## 2021-06-11 NOTE — Telephone Encounter (Signed)
-----   Message from Scott T Weaver, PA-C sent at 06/11/2021 12:50 PM EST ----- °Echocardiogram shows:  EF normal.  Mild to mod AI.  Ascending thoracic aortic aneurysm.   °PLAN:  °-Continue current medications/treatment plan and follow up as scheduled.  °-Arrange chest/aorta CTA (Dx thoracic aortic aneurysm) °-Send copy to PCP °-Repeat echocardiogram 1 year.  °Scott Weaver, PA-C    °06/11/2021 12:46 PM   °

## 2021-06-11 NOTE — Telephone Encounter (Signed)
Repeat echo ordered at this time (1 year)

## 2021-06-14 ENCOUNTER — Other Ambulatory Visit: Payer: Self-pay | Admitting: *Deleted

## 2021-06-22 ENCOUNTER — Ambulatory Visit
Admission: RE | Admit: 2021-06-22 | Discharge: 2021-06-22 | Disposition: A | Discharge status: Home / Self Care | Payer: Medicare Other | Source: Ambulatory Visit | Attending: Physician Assistant | Admitting: Physician Assistant

## 2021-06-22 ENCOUNTER — Other Ambulatory Visit: Payer: Self-pay

## 2021-06-22 DIAGNOSIS — I7121 Aneurysm of the ascending aorta, without rupture: Secondary | ICD-10-CM

## 2021-06-22 MED ORDER — IOHEXOL 350 MG/ML SOLN
100.0000 mL | Freq: Once | INTRAVENOUS | Status: DC | PRN
Start: 2021-06-22 — End: 2021-06-23

## 2021-06-22 NOTE — Addendum Note (Signed)
Addended by: Ma Hillock on: 06/22/2021 11:25 AM   Modules accepted: Orders

## 2021-07-01 ENCOUNTER — Other Ambulatory Visit: Payer: Self-pay

## 2021-07-01 ENCOUNTER — Ambulatory Visit (HOSPITAL_COMMUNITY)
Admission: RE | Admit: 2021-07-01 | Discharge: 2021-07-01 | Disposition: A | Discharge status: Home / Self Care | Payer: Medicare Other | Source: Ambulatory Visit | Attending: Physician Assistant | Admitting: Physician Assistant

## 2021-07-01 DIAGNOSIS — I7121 Aneurysm of the ascending aorta, without rupture: Secondary | ICD-10-CM | POA: Diagnosis not present

## 2021-07-01 DIAGNOSIS — E041 Nontoxic single thyroid nodule: Secondary | ICD-10-CM | POA: Diagnosis not present

## 2021-07-01 MED ORDER — SODIUM CHLORIDE (PF) 0.9 % IJ SOLN
INTRAMUSCULAR | Status: AC
  Filled 2021-07-01: qty 50

## 2021-07-01 MED ORDER — IOHEXOL 350 MG/ML SOLN
100.0000 mL | Freq: Once | INTRAVENOUS | Status: AC | PRN
  Administered 2021-07-01: 09:00:00 100 mL via INTRAVENOUS

## 2021-07-02 ENCOUNTER — Telehealth: Payer: Self-pay

## 2021-07-02 DIAGNOSIS — I10 Essential (primary) hypertension: Secondary | ICD-10-CM

## 2021-07-02 DIAGNOSIS — I7121 Aneurysm of the ascending aorta, without rupture: Secondary | ICD-10-CM

## 2021-07-02 NOTE — Telephone Encounter (Signed)
-----   Message from Beatrice Lecher, [place] sent at 07/02/2021  2:12 PM EST ----- ?Chest CT demonstrates mild dilation of ascending thoracic aorta (41 mm). ?CT also demonstrates fluid and debris in the esophagus, possibly related to acid reflux.  There is also significant progression of L1 vertebral body compression fracture. ?PLAN:  ?-Continue current medications ?-Repeat chest/aorta CTA in 1 year (diagnosis: Thoracic aortic aneurysm) ?-Please arrange follow-up with PCP to address fluid/debris in esophagus and L1 vertebral compression fracture ?-I will fwd to PCP as FYI ?Tereso Newcomer, PA-C    ?07/02/2021 2:08 PM   ? ?

## 2021-07-02 NOTE — Telephone Encounter (Signed)
The patient has been notified of the result and verbalized understanding.  All questions (if any) were answered. ?Wyatt Burows, RN 07/02/2021 3:37 PM   ?Pt advised to set up f/u with PCP.  Order placed for CT aorta and BMP prior to CT.  Results forwarded to PCP via Epic.  ?

## 2021-07-22 ENCOUNTER — Ambulatory Visit (INDEPENDENT_AMBULATORY_CARE_PROVIDER_SITE_OTHER): Payer: Medicare Other | Admitting: Family Medicine

## 2021-07-22 ENCOUNTER — Encounter: Payer: Self-pay | Admitting: Family Medicine

## 2021-07-22 VITALS — BP 128/74 | HR 63 | Temp 98.4°F | Ht 72.0 in | Wt 249.8 lb

## 2021-07-22 DIAGNOSIS — I10 Essential (primary) hypertension: Secondary | ICD-10-CM | POA: Diagnosis not present

## 2021-07-22 DIAGNOSIS — E782 Mixed hyperlipidemia: Secondary | ICD-10-CM | POA: Diagnosis not present

## 2021-07-22 DIAGNOSIS — M1990 Unspecified osteoarthritis, unspecified site: Secondary | ICD-10-CM | POA: Diagnosis not present

## 2021-07-22 DIAGNOSIS — I7121 Aneurysm of the ascending aorta, without rupture: Secondary | ICD-10-CM | POA: Diagnosis not present

## 2021-07-22 MED ORDER — AMLODIPINE BESYLATE 5 MG PO TABS: 5.0000 mg | ORAL_TABLET | Freq: Every day | ORAL | 3 refills | Status: DC

## 2021-07-22 NOTE — Progress Notes (Signed)
? ?  Wyatt Mason is a 88 y.o. male who presents today for an office visit. ? ?Assessment/Plan:  ?Chronic Problems Addressed Today: ?Essential hypertension ?At goal today.  He is having some leg edema likely due to side effect of increased dose of amlodipine.  We will go back to 5 mg daily.  He will continue his other medications including Imdur 30 mg daily and metoprolol tartrate 50 mg twice daily.  He will continue to monitor at home and check in with me in a couple of weeks. ? ?Osteoarthritis ?Recommended over-the-counter Voltaren gel.  Would avoid NSAIDs due to cardiac history. ? ?Thoracic ascending aortic aneurysm ?Stable on recent CT angiogram.  Will need repeat next year. ? ?  ?Subjective:  ?HPI: ? ?He also complain leg swelling. Located on ankle. This started about 2 months ago. He saw his cardiologist on 06/08/2021. His blood pressure medication was increased from 5 mg to 10 mg. He is currently on amlodipine 10 mg which could be contributing to his swelling. He also taking isosorbide mononitrate 30 mg daily, metoprolol tartrate 50 mg twice daily. He is tolerating his medication. No side effects. ? ?He had CT scan few weeks ago. His CT scan results showed esophagus filled with fluid/debris. Today, he e denies any heart burn or pain.  ? ?He still has some arthritis pain. He has been taking tylenol with modest improvement.  ? ?   ?  ?Objective:  ?Physical Exam: ?BP 128/74 (BP Location: Left Arm)   Pulse 63   Temp 98.4 ?F (36.9 ?C) (Temporal)   Ht 6' (1.829 m)   Wt 249 lb 12.8 oz (113.3 kg)   SpO2 94%   BMI 33.88 kg/m?   ?Gen: No acute distress, resting comfortably ?CV: Regular rate and rhythm with no murmurs appreciated ?Pulm: Normal work of breathing, clear to auscultation bilaterally with no crackles, wheezes, or rhonchi ?MSK: 1+ pitting edema to knees.  ?Neuro: Grossly normal, moves all extremities ?Psych: Normal affect and thought content ? ?   ? ? ?I,Savera Zaman,acting as a Neurosurgeon for Jacquiline Doe, MD.,have documented all relevant documentation on the behalf of Jacquiline Doe, MD,as directed by  Jacquiline Doe, MD while in the presence of Jacquiline Doe, MD.  ? ?I, Jacquiline Doe, MD, have reviewed all documentation for this visit. The documentation on 07/22/21 for the exam, diagnosis, procedures, and orders are all accurate and complete. ? ?Katina Degree. Jimmey Ralph, MD ?07/22/2021 9:52 AM  ? ?

## 2021-07-22 NOTE — Patient Instructions (Signed)
It was very nice to see you today! ? ?I think your leg swelling is coming from the amlodipine.  Please go back to 5 mg daily. ? ?Keep an eye on your blood pressure and let me know how it is looking in a couple of weeks. ? ?I will see you back in 6 to 12 months.  Come back to see me sooner if needed. ? ?Take care, ?Dr Jerline Pain ? ?PLEASE NOTE: ? ?If you had any lab tests please let us know if you have not heard back within a few days. You may see your results on mychart before we have a chance to review them but we will give you a call once they are reviewed by [place]. If we ordered any referrals today, please let us know if you have not heard from their office within the next week.  ? ?Please try these tips to maintain a healthy lifestyle: ? ?Eat at least 3 REAL meals and 1-2 snacks per day.  Aim for no more than 5 hours between eating.  If you eat breakfast, please do so within one hour of getting up.  ? ?Each meal should contain half fruits/vegetables, one quarter protein, and one quarter carbs (no bigger than a computer mouse) ? ?Cut down on sweet beverages. This includes juice, soda, and sweet tea.  ? ?Drink at least 1 glass of water with each meal and aim for at least 8 glasses per day ? ?Exercise at least 150 minutes every week.   ?

## 2021-07-22 NOTE — Assessment & Plan Note (Signed)
Stable on recent CT angiogram.  Will need repeat next year. ?

## 2021-07-22 NOTE — Assessment & Plan Note (Signed)
At goal today.  He is having some leg edema likely due to side effect of increased dose of amlodipine.  We will go back to 5 mg daily.  He will continue his other medications including Imdur 30 mg daily and metoprolol tartrate 50 mg twice daily.  He will continue to monitor at home and check in with me in a couple of weeks. ?

## 2021-07-22 NOTE — Assessment & Plan Note (Signed)
Recommended over-the-counter Voltaren gel.  Would avoid NSAIDs due to cardiac history. ?

## 2021-08-20 DIAGNOSIS — H11441 Conjunctival cysts, right eye: Secondary | ICD-10-CM | POA: Diagnosis not present

## 2021-08-20 DIAGNOSIS — H02831 Dermatochalasis of right upper eyelid: Secondary | ICD-10-CM | POA: Diagnosis not present

## 2021-08-20 DIAGNOSIS — H02834 Dermatochalasis of left upper eyelid: Secondary | ICD-10-CM | POA: Diagnosis not present

## 2021-08-20 DIAGNOSIS — H04221 Epiphora due to insufficient drainage, right lacrimal gland: Secondary | ICD-10-CM | POA: Diagnosis not present

## 2021-08-20 DIAGNOSIS — Z961 Presence of intraocular lens: Secondary | ICD-10-CM | POA: Diagnosis not present

## 2021-08-20 DIAGNOSIS — H02403 Unspecified ptosis of bilateral eyelids: Secondary | ICD-10-CM | POA: Diagnosis not present

## 2021-09-09 ENCOUNTER — Other Ambulatory Visit: Payer: Medicare Other

## 2021-09-09 DIAGNOSIS — I10 Essential (primary) hypertension: Secondary | ICD-10-CM

## 2021-09-09 DIAGNOSIS — Z79899 Other long term (current) drug therapy: Secondary | ICD-10-CM

## 2021-09-09 DIAGNOSIS — E782 Mixed hyperlipidemia: Secondary | ICD-10-CM

## 2021-09-09 LAB — HEPATIC FUNCTION PANEL
ALT: 17 IU/L (ref 0–44)
AST: 20 IU/L (ref 0–40)
Albumin: 4.4 g/dL (ref 3.6–4.6)
Alkaline Phosphatase: 70 IU/L (ref 44–121)
Bilirubin Total: 0.4 mg/dL (ref 0.0–1.2)
Bilirubin, Direct: 0.1 mg/dL (ref 0.00–0.40)
Total Protein: 6.5 g/dL (ref 6.0–8.5)

## 2021-09-09 LAB — LIPID PANEL
Chol/HDL Ratio: 2.1 ratio (ref 0.0–5.0)
Cholesterol, Total: 166 mg/dL (ref 100–199)
HDL: 78 mg/dL (ref >39)
LDL Chol Calc (NIH): 62 mg/dL (ref 0–99)
Triglycerides: 161 mg/dL — ABNORMAL HIGH (ref 0–149)
VLDL Cholesterol Cal: 26 mg/dL (ref 5–40)

## 2021-09-10 NOTE — Progress Notes (Signed)
Pt has been made aware of normal result and verbalized understanding.  jw

## 2021-11-03 ENCOUNTER — Telehealth: Payer: Self-pay | Admitting: Family Medicine

## 2021-11-03 NOTE — Telephone Encounter (Signed)
Copied from CRM [id number]. Topic: Medicare AWV >> Nov 03, 2021 10:18 AM Payton Doughty wrote: Reason for CRM: Called patient to schedule Annual Wellness Visit.  Please schedule with Nurse Health Advisor Lanier Ensign, RN at [facility]. This appt can be telephone or office visit. Please call [phone] ask for [facility]

## 2021-12-02 IMAGING — MR MR LUMBAR SPINE W/O CM
4 of 5 series · 33 of 48 positions shown · non-contrast
Comparison: Lumbar radiographs 09/02/2005.

CLINICAL DATA: 86-year-old male status post fall 1 week ago,
compression fracture. Severe pain.

EXAM:
MRI LUMBAR SPINE WITHOUT CONTRAST
TECHNIQUE: Multiplanar, multisequence MR imaging of the lumbar spine was
performed. No intravenous contrast was administered.

[Series 5: T2 · sagittal · 4.0mm · 0.81mm/px · 8 of 19 slices shown (1 of 2)]
[im 1/19]
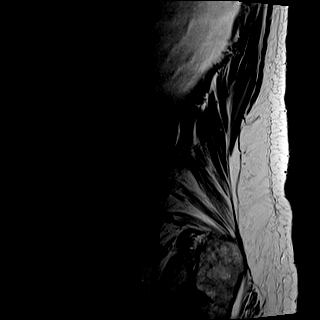
[im 3/19]
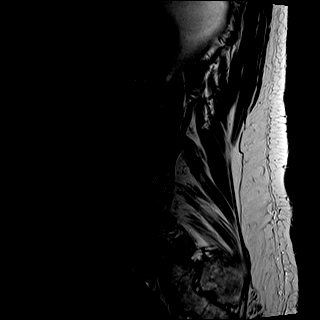
[im 6/19]
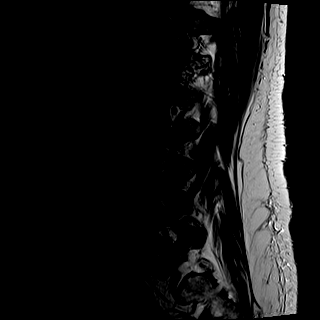
[im 8/19]
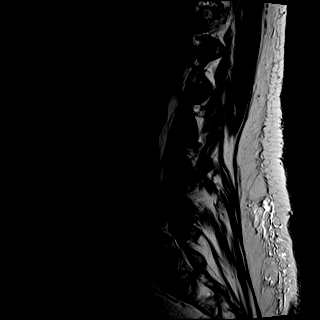
[im 11/19]
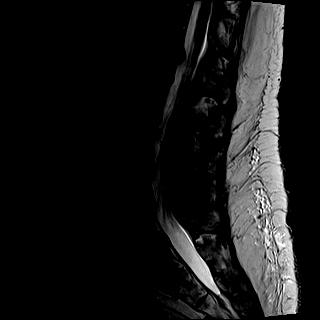
[im 13/19]
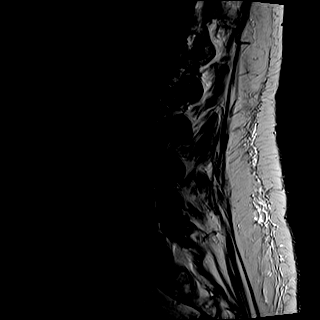
[im 16/19]
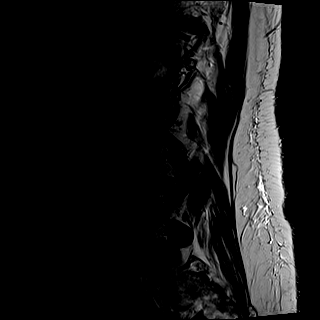
[im 19/19]
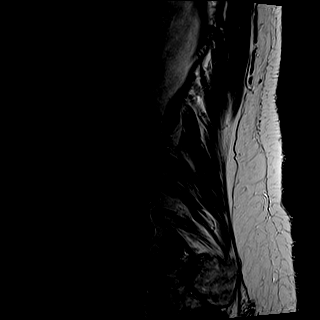

[Series 6: T1 · sagittal · 4.0mm · 0.81mm/px · 7 of 19 slices shown (1 of 2)]
[im 1/19]
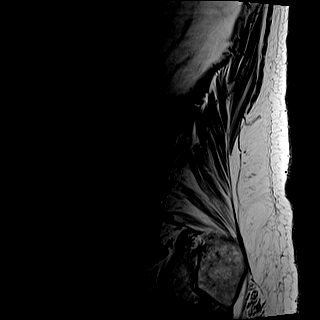
[im 4/19]
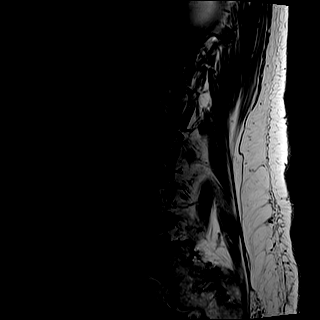
[im 7/19]
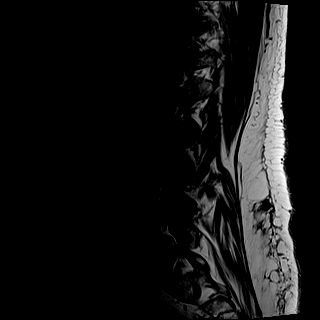
[im 10/19]
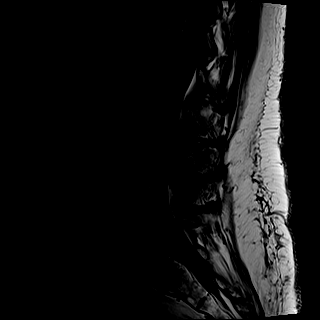
[im 13/19]
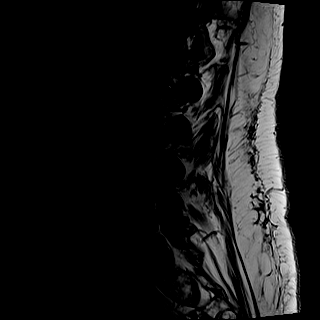
[im 16/19]
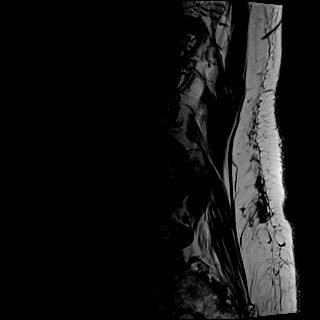
[im 19/19]
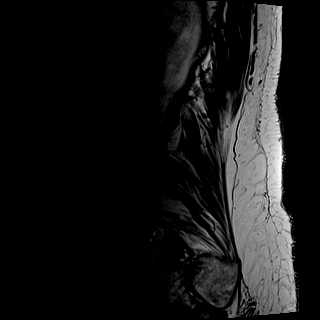

[Series 8: T2 · axial · 4.0mm · 0.78mm/px · z∈[-69,+146]mm · 9 of 36 slices shown (2 of 2)]
[im 1/36]
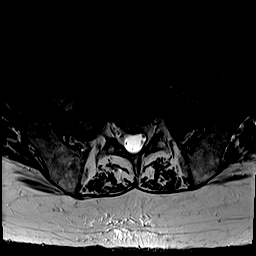
[im 6/36]
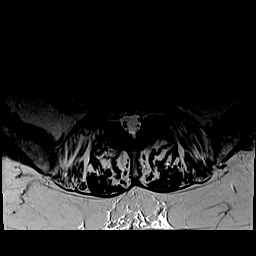
[im 12/36]
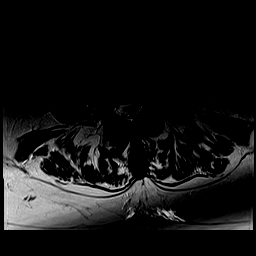
[im 15/36]
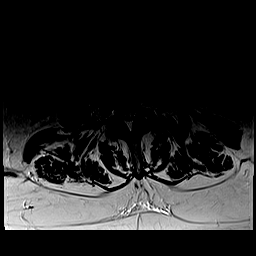
[im 18/36]
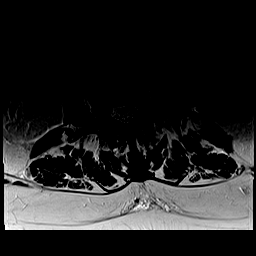
[im 21/36]
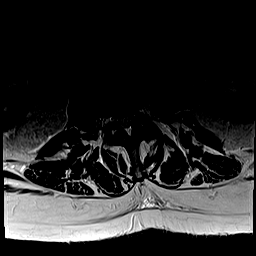
[im 24/36]
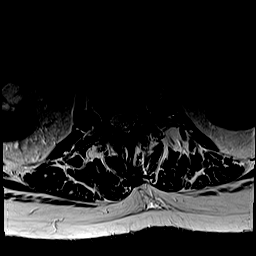
[im 30/36]
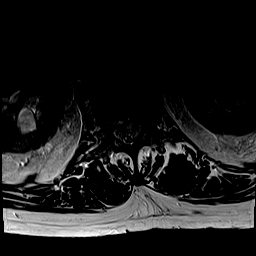
[im 36/36]
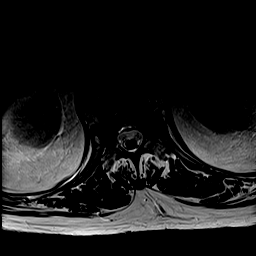

[Series 9: T1 · axial · 4.0mm · 0.39mm/px · z∈[-69,+146]mm · 9 of 36 slices shown (2 of 2)]
[im 1/36]
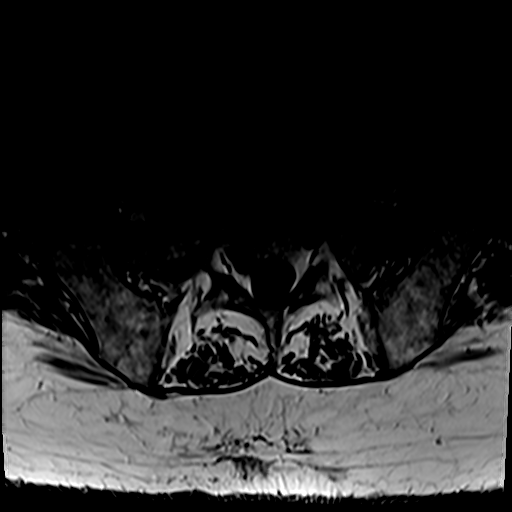
[im 6/36]
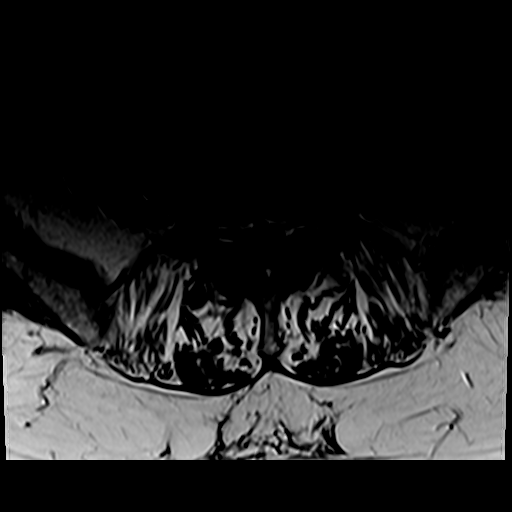
[im 12/36]
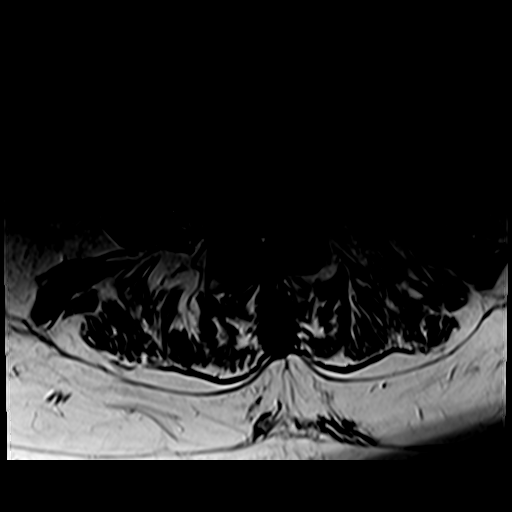
[im 15/36]
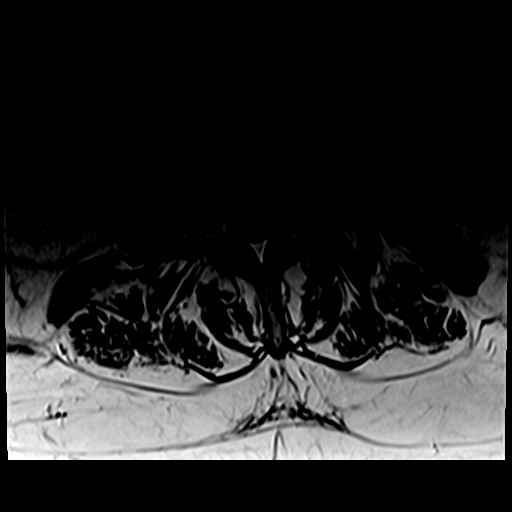
[im 18/36]
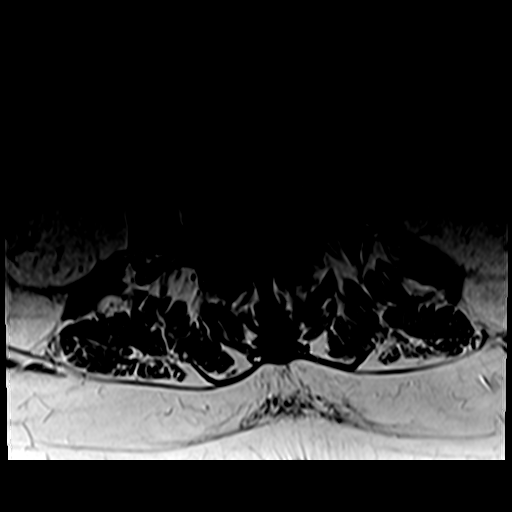
[im 21/36]
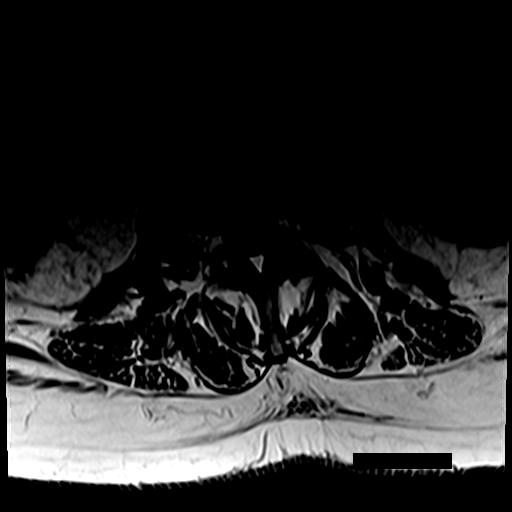
[im 24/36]
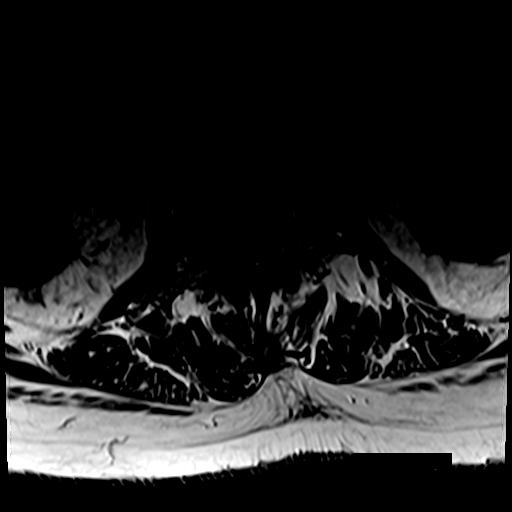
[im 30/36]
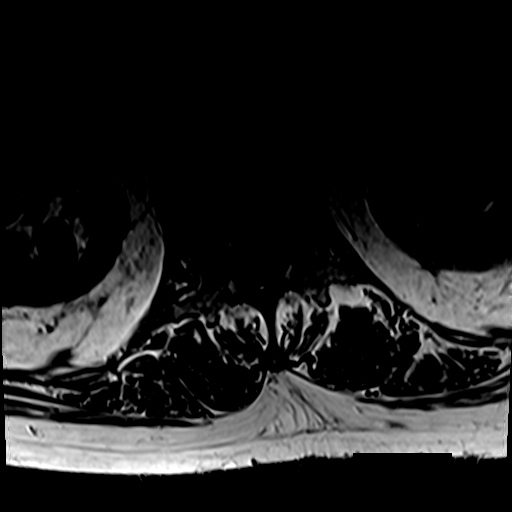
[im 36/36]
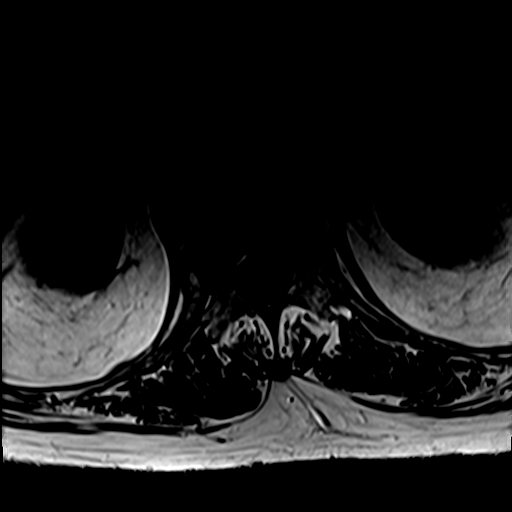

[33 of 48 positions shown; findings below may reference images not displayed]

FINDINGS: Segmentation:  Normal on the comparison radiographs.

Alignment:  Preserved overall lumbar lordosis.

Vertebrae: Compression of the L1 vertebral body with approximately
35% loss of height. Comminuted superior endplate. Mild retropulsion
of the posterosuperior body resulting in mild spinal stenosis at the
level of the conus (series 5, image 10). There is mild marrow edema
in both pedicles, but the L1 posterior elements appear intact.

The T12 level and other lumbar vertebrae remain intact. There is
faint degenerative appearing endplate marrow edema superiorly at L5
on the right, where there is evidence of a small superior endplate
Schmorl's node on series 5, image 8. Normal background bone marrow
signal. Intact visible sacrum and SI joints.

Conus medullaris and cauda equina: Conus extends to the T12-L1
level. No lower spinal cord or conus signal abnormality.

Paraspinal and other soft tissues: Negative visible abdominal
viscera, multiple benign renal parapelvic cysts. Moderate distension
of the urinary bladder (series 7, image 9. Mild L1 level
paravertebral soft tissue edema which is greater on the right.

Disc levels:

Outside of the L1 findings above there is mild/age-appropriate
lumbar spine degeneration. No significant degenerative lumbar spinal
stenosis.
IMPRESSION: 1. Acute to subacute L1 compression fracture with 35% loss of height
and retropulsed bone resulting in mild spinal stenosis at the level
of the conus.

2. Other visible spinal levels appear intact. Mild degenerative
endplate edema suspected at L5.

## 2021-12-13 ENCOUNTER — Ambulatory Visit: Payer: Medicare Other | Admitting: Cardiovascular Disease

## 2021-12-13 ENCOUNTER — Encounter: Payer: Self-pay | Admitting: Cardiovascular Disease

## 2021-12-13 VITALS — BP 138/74 | HR 75 | Ht 72.0 in | Wt 251.2 lb

## 2021-12-13 DIAGNOSIS — I7121 Aneurysm of the ascending aorta, without rupture: Secondary | ICD-10-CM

## 2021-12-13 DIAGNOSIS — I10 Essential (primary) hypertension: Secondary | ICD-10-CM

## 2021-12-13 DIAGNOSIS — E782 Mixed hyperlipidemia: Secondary | ICD-10-CM | POA: Diagnosis not present

## 2021-12-13 DIAGNOSIS — I351 Nonrheumatic aortic (valve) insufficiency: Secondary | ICD-10-CM | POA: Diagnosis not present

## 2021-12-13 DIAGNOSIS — I25119 Atherosclerotic heart disease of native coronary artery with unspecified angina pectoris: Secondary | ICD-10-CM | POA: Diagnosis not present

## 2021-12-13 MED ORDER — METOPROLOL TARTRATE 100 MG PO TABS: 100.0000 mg | ORAL_TABLET | Freq: Once | ORAL | 0 refills | Status: DC

## 2021-12-13 NOTE — Patient Instructions (Addendum)
Medication Instructions:  Your physician recommends that you continue on your current medications as directed. Please refer to the Current Medication list given to you today.  *If you need a refill on your cardiac medications before your next appointment, please call your pharmacy*   Lab Work: BMET (prior to CT)  If you have labs (blood work) drawn today and your tests are completely normal, you will receive your results only by: MyChart Message (if you have MyChart) OR A paper copy in the mail If you have any lab test that is abnormal or we need to change your treatment, we will call you to review the results.   Testing/Procedures: Your physician has requested that you have Coronary cardiac CT in 5 months. Cardiac computed tomography (CT) is a painless test that uses an x-ray machine to take clear, detailed pictures of your heart. For further information please visit [email or web address] Please follow instruction sheet as given.   Follow-Up: At [facility], you and your health needs are our priority.  As part of our continuing mission to provide you with exceptional heart care, we have created designated Provider Care Teams.  These Care Teams include your primary Cardiologist (physician) and Advanced Practice Providers (APPs -  Physician Assistants and Nurse Practitioners) who all work together to provide you with the care you need, when you need it.  We recommend signing up for the patient portal called "MyChart".  Sign up information is provided on this After Visit Summary.  MyChart is used to connect with patients for Virtual Visits (Telemedicine).  Patients are able to view lab/test results, encounter notes, upcoming appointments, etc.  Non-urgent messages can be sent to your provider as well.   To learn more about what you can do with MyChart, go to [email or web address].    Your next appointment:   6 month(s)  The format for your next appointment:   In Person  Provider:    Tonny Bollman, MD     Other Instructions    Your cardiac CT will be scheduled at one of the below locations:   [facility] [address] [place], [place] [address] [phone]   Please arrive at the [facility] and Children's Entrance (Entrance C2) of [facility] 30 minutes prior to test start time. You can use the FREE valet parking offered at entrance C (encouraged to control the heart rate for the test)  Proceed to the [facility] Department (first floor) to check-in and test prep.  All radiology patients and guests should use entrance C2 at [facility], accessed from [facility], even though the hospital's physical address listed is [address].    Please follow these instructions carefully (unless otherwise directed):  Hold all erectile dysfunction medications at least 3 days (72 hrs) prior to test.  On the Night Before the Test: Be sure to Drink plenty of water. Do not consume any caffeinated/decaffeinated beverages or chocolate 12 hours prior to your test. Do not take any antihistamines 12 hours prior to your test.  On the Day of the Test: Drink plenty of water until 1 hour prior to the test. Do not eat any food 4 hours prior to the test. You may take your regular medications prior to the test.  Take metoprolol (Lopressor) two hours prior to test.       After the Test: Drink plenty of water. After receiving IV contrast, you may experience a mild flushed feeling. This is normal. On occasion,  you may experience a mild rash up to 24 hours after the test. This is not dangerous. If this occurs, you can take Benadryl 25 mg and increase your fluid intake. If you experience trouble breathing, this can be serious. If it is severe call 911 IMMEDIATELY. If it is mild, please call our office. If you take any of these medications: Glipizide/Metformin, Avandament, Glucavance, please do not take 48 hours after  completing test unless otherwise instructed.  We will call to schedule your test 2-4 weeks out understanding that some insurance companies will need an authorization prior to the service being performed.   For non-scheduling related questions, please contact the cardiac imaging nurse navigator should you have any questions/concerns: Rockwell Alexandria, Cardiac Imaging Nurse Navigator Larey Brick, Cardiac Imaging Nurse Navigator [facility] Direct Office Dial: [phone]   For scheduling needs, including cancellations and rescheduling, please call [place], [phone].

## 2021-12-13 NOTE — Progress Notes (Signed)
Cardiology Office Note:    Date:  12/13/2021   ID:  Wyatt Mason, DOB 30-Jul-1932, MRN [id number]  PCP:  Vivi Barrack, [facility] Cardiologist:  Sherren Mocha, MD     Referring MD: Vivi Barrack, MD   Chief Complaint  Patient presents with   Coronary Artery Disease    History of Present Illness:    Wyatt Mason is a 88 y.o. male with a hx of: Coronary artery disease  NSTEMI s/p DES x 2 to OM in 2014 Hypertension  Hyperlipidemia  Hx of lumbar compression fx S/p L knee surgery  Right Bundle Branch Block   The patient is here alone today.  He has been doing fairly well from a cardiac perspective.  He does not have any specific cardiac-related complaints.  He denies chest pain, chest pressure, shortness of breath, or heart palpitations.  He has noticed some mild leg swelling at times with his sock line.  Otherwise no complaints.  He reports no recent medicine changes.  Past Medical History:  Diagnosis Date   Aortic insufficiency 06/11/2021   Echo 2/23: EF 60-65, no RWMA, mild LVH, GR 1 DD, GLS -23.2, normal RVSF, normal PASP, mild-moderate AI, AV sclerosis without stenosis, ascending aortic aneurysm 4.3 cm   CAD (coronary artery disease) 04/15/2013   a. s/p DES x 2 to mid OM, residual RCA and LAD borderline CAD. b. Nuc 12/2014 - low risk.   Hyperglycemia    Hyperlipidemia    Hypertension    Obesity    Right bundle branch block 06/08/2021   Shingles 2005   Thoracic ascending aortic aneurysm ([place]) 06/11/2021   Echo 2/23:  ascending aortic aneurysm 4.3 cm    Past Surgical History:  Procedure Laterality Date   APPENDECTOMY     CORONARY ANGIOPLASTY WITH STENT PLACEMENT  04/15/2013   sequential 90% then 95% mid OM lesions s/p DES x 2, residual 50% prox LAD and 50-70% mid RCA stenoses; LV gram deferred   KNEE ARTHROSCOPY     LEFT HEART CATHETERIZATION WITH CORONARY ANGIOGRAM N/A 04/15/2013   Procedure: LEFT HEART CATHETERIZATION WITH CORONARY  ANGIOGRAM;  Surgeon: Blane Ohara, MD;  Location: [facility] CATH LAB;  Service: Cardiovascular;  Laterality: N/A;   no colonoscopy     SOC reviewed 04/29/14   TOTAL HIP ARTHROPLASTY Right 2005    Current Medications: Current Meds  Medication Sig   amLODipine (NORVASC) 5 MG tablet Take 1 tablet (5 mg total) by mouth daily.   amoxicillin (AMOXIL) 500 MG capsule Take 500 mg by mouth as needed (for Dental appointments).   aspirin EC 81 MG tablet Take 81 mg by mouth daily.   Cholecalciferol (VITAMIN D-3) 1000 UNITS CAPS Take 1 capsule by mouth daily.    ezetimibe (ZETIA) 10 MG tablet TAKE 1 TABLET BY MOUTH EVERY DAY   isosorbide mononitrate (IMDUR) 30 MG 24 hr tablet TAKE 1 TABLET BY MOUTH EVERY DAY   metoprolol tartrate (LOPRESSOR) 100 MG tablet Take 1 tablet (100 mg total) by mouth once for 1 dose. Take 1 tablet (100 mg total) two hours prior to CT scan.   metoprolol tartrate (LOPRESSOR) 50 MG tablet Take 1 tablet (50 mg total) by mouth 2 (two) times daily.   Multiple Vitamin (MULTIVITAMIN WITH MINERALS) TABS tablet Take 1 tablet by mouth daily.   nitroGLYCERIN (NITROSTAT) 0.4 MG SL tablet place 1 tablet under the tongue if needed every 5 minutes for 3 doses if needed for  chest pain   rosuvastatin (CRESTOR) 40 MG tablet Take 1 tablet (40 mg total) by mouth daily.     Allergies:   Other   Social History   Socioeconomic History   Marital status: Married    Spouse name: Not on file   Number of children: Not on file   Years of education: Not on file   Highest education level: Not on file  Occupational History   Occupation: Retired  Tobacco Use   Smoking status: Never   Smokeless tobacco: Never  Vaping Use   Vaping Use: Never used  Substance and Sexual Activity   Alcohol use: Yes    Alcohol/week: 2.0 standard drinks of alcohol    Types: 2 Shots of liquor per week    Comment: 2 to 3 times a week   Drug use: No   Sexual activity: Yes    Birth control/protection: None  Other Topics  Concern   Not on file  Social History Narrative   Not on file   Social Determinants of Health   Financial Resource Strain: Low Risk  (12/02/2019)   Overall Financial Resource Strain (CARDIA)    Difficulty of Paying Living Expenses: Not hard at all  Food Insecurity: No Food Insecurity (12/02/2019)   Hunger Vital Sign    Worried About Running Out of Food in the Last Year: Never true    Ran Out of Food in the Last Year: Never true  Transportation Needs: No Transportation Needs (12/02/2019)   PRAPARE - Hydrologist (Medical): No    Lack of Transportation (Non-Medical): No  Physical Activity: Insufficiently Active (12/02/2019)   Exercise Vital Sign    Days of Exercise per Week: 3 days    Minutes of Exercise per Session: 30 min  Stress: No Stress Concern Present (12/02/2019)   [place]    Feeling of Stress : Not at all  Social Connections: Moderately Integrated (12/02/2019)   Social Connection and Isolation Panel [NHANES]    Frequency of Communication with Friends and Family: More than three times a week    Frequency of Social Gatherings with Friends and Family: Never    Attends Religious Services: More than 4 times per year    Active Member of [employer] or Organizations: No    Attends Archivist Meetings: Never    Marital Status: Married     Family History: The patient's family history includes Heart attack in his father. There is no history of CVA, Cancer, Diabetes, or Stroke.  ROS:   Please see the history of present illness.    All other systems reviewed and are negative.  EKGs/Labs/Other Studies Reviewed:    The following studies were reviewed today: Echo 06/11/21: 1. Left ventricular ejection fraction, by estimation, is 60 to 65%. The  left ventricle has normal function. The left ventricle has no regional  wall motion abnormalities. There is mild left ventricular hypertrophy.   Left ventricular diastolic parameters  are consistent with Grade I diastolic dysfunction (impaired relaxation).  The average left ventricular global longitudinal strain is -23.2 %. The  global longitudinal strain is normal.   2. Right ventricular systolic function is normal. The right ventricular  size is normal. There is normal pulmonary artery systolic pressure.   3. The mitral valve is grossly normal. No evidence of mitral valve  regurgitation.   4. Aortic valve regurgitation is mild to moderate. Aortic valve  sclerosis/calcification is present, without  any evidence of aortic  stenosis.   5. Aortic ascending aortic aneurysm 4.3 cm.   6. The inferior vena cava is normal in size with greater than 50%  respiratory variability, suggesting right atrial pressure of 3 mmHg.   Comparison(s): No prior Echocardiogram.   CTA 07/01/21: IMPRESSION: 1. Mild aneurysmal dilation of the ascending thoracic aorta measuring up to 4.1 cm. Recommend annual imaging followup by CTA or MRA. This recommendation follows 2010 ACCF/AHA/AATS/ACR/ASA/SCA/SCAI/SIR/STS/SVM Guidelines for the Diagnosis and Management of Patients with Thoracic Aortic Disease. Circulation. 2010; 121: P824-M353. Aortic aneurysm NOS (ICD10-I71.9) 2. Coronary artery calcifications. 3. Fluid/debris in the esophagus. Correlate with any history of reflux. 4. Compression fracture of the L1 vertebral body is incompletely imaged but appears significantly progressed since 08/01/2019, now with near-complete loss of vertebral body height anteriorly with bony retropulsion.  EKG:  EKG is not ordered today.    Recent Labs: 06/08/2021: BUN 16; Creatinine, Ser 0.80; Potassium 4.3; Sodium 141 09/09/2021: ALT 17  Recent Lipid Panel    Component Value Date/Time   CHOL 166 09/09/2021 1141   TRIG 161 (H) 09/09/2021 1141   HDL 78 09/09/2021 1141   CHOLHDL 2.1 09/09/2021 1141   CHOLHDL 3 11/28/2018 1000   VLDL 20.2 11/28/2018 1000   LDLCALC 62  09/09/2021 1141     Risk Assessment/Calculations:                Physical Exam:    VS:  BP 138/74   Pulse 75   Ht 6' (1.829 m)   Wt 251 lb 3.2 oz (113.9 kg)   SpO2 95%   BMI 34.07 kg/m     Wt Readings from Last 3 Encounters:  12/13/21 251 lb 3.2 oz (113.9 kg)  07/22/21 249 lb 12.8 oz (113.3 kg)  06/08/21 240 lb 9.6 oz (109.1 kg)     GEN:  Well nourished, well developed in no acute distress HEENT: Normal NECK: No JVD; No carotid bruits LYMPHATICS: No lymphadenopathy CARDIAC: RRR, 1/6 ejection murmur at the right upper sternal border, no diastolic murmur RESPIRATORY:  Clear to auscultation without rales, wheezing or rhonchi  ABDOMEN: Soft, non-tender, non-distended MUSCULOSKELETAL:  No edema; No deformity  SKIN: Warm and dry NEUROLOGIC:  Alert and oriented x 3 PSYCHIATRIC:  Normal affect   ASSESSMENT:    1. Coronary artery disease involving native coronary artery of native heart with angina pectoris (HCC)   2. Mixed hyperlipidemia   3. Essential hypertension   4. Aneurysm of ascending aorta without rupture (HCC)   5. Aortic valve insufficiency, etiology of cardiac valve disease unspecified    PLAN:    In order of problems listed above:  The patient appears stable with no symptoms of angina on a background medical program of amlodipine, isosorbide, and metoprolol for antianginal therapy.  He remains on aspirin for antiplatelet therapy.  His lipid-lowering drugs include rosuvastatin and ezetimibe.  We discussed lifestyle modification.  No changes recommended in his medications today. Total cholesterol 166, HDL 78, LDL 62.  Continue current treatment with ezetimibe and rosuvastatin. Blood pressure is well controlled on his current regimen which is reviewed today.  I suspect his amlodipine might be contributing to the mild leg edema that he is experienced from time to time.  I am not inclined to change his medications as he seems to be doing very well. Incidentally  noted on a follow-up echocardiogram.  His CTA study showed mild aneurysmal dilatation of the ascending aorta at 41 mm.  We will order  a repeat scan next year. Not audible on exam, appears asymptomatic, unlikely to cause issues for him but we will keep an eye on this over time with serial echo studies.           Medication Adjustments/Labs and Tests Ordered: Current medicines are reviewed at length with the patient today.  Concerns regarding medicines are outlined above.  Orders Placed This Encounter  Procedures   CT CORONARY MORPH W/CTA COR W/SCORE W/CA W/CM &/OR WO/CM   Basic metabolic panel   Meds ordered this encounter  Medications   metoprolol tartrate (LOPRESSOR) 100 MG tablet    Sig: Take 1 tablet (100 mg total) by mouth once for 1 dose. Take 1 tablet (100 mg total) two hours prior to CT scan.    Dispense:  1 tablet    Refill:  0    Patient Instructions  Medication Instructions:  Your physician recommends that you continue on your current medications as directed. Please refer to the Current Medication list given to you today.  *If you need a refill on your cardiac medications before your next appointment, please call your pharmacy*   Lab Work: BMET (prior to CT)  If you have labs (blood work) drawn today and your tests are completely normal, you will receive your results only by: [place] (if you have MyChart) OR A paper copy in the mail If you have any lab test that is abnormal or we need to change your treatment, we will call you to review the results.   Testing/Procedures: Your physician has requested that you have Coronary cardiac CT in 5 months. Cardiac computed tomography (CT) is a painless test that uses an x-ray machine to take clear, detailed pictures of your heart. For further information please visit [email or web address]. Please follow instruction sheet as given.   Follow-Up: At [facility], you and your health needs are our priority.  As part of  our continuing mission to provide you with exceptional heart care, we have created designated Provider Care Teams.  These Care Teams include your primary Cardiologist (physician) and Advanced Practice Providers (APPs -  Physician Assistants and Nurse Practitioners) who all work together to provide you with the care you need, when you need it.  We recommend signing up for the patient portal called "MyChart".  Sign up information is provided on this After Visit Summary.  MyChart is used to connect with patients for Virtual Visits (Telemedicine).  Patients are able to view lab/test results, encounter notes, upcoming appointments, etc.  Non-urgent messages can be sent to your provider as well.   To learn more about what you can do with MyChart, go to [email or web address].    Your next appointment:   6 month(s)  The format for your next appointment:   In Person  Provider:   Sherren Mocha, MD     Other Instructions    Your cardiac CT will be scheduled at one of the below locations:   [facility] [address] [place], [place] [address] [phone]   Please arrive at the [facility] and [facility]'s Entrance (Entrance C2) of [facility] 30 minutes prior to test start time. You can use the FREE valet parking offered at entrance C (encouraged to control the heart rate for the test)  Proceed to the [facility] Radiology Department (first floor) to check-in and test prep.  All radiology patients and guests should use entrance C2 at [facility], accessed from [facility], even though the hospital's  physical address listed is [address]    Please follow these instructions carefully (unless otherwise directed):  Hold all erectile dysfunction medications at least 3 days (72 hrs) prior to test.  On the Night Before the Test: Be sure to Drink plenty of water. Do not consume any caffeinated/decaffeinated beverages or chocolate 12 hours  prior to your test. Do not take any antihistamines 12 hours prior to your test.  On the Day of the Test: Drink plenty of water until 1 hour prior to the test. Do not eat any food 4 hours prior to the test. You may take your regular medications prior to the test.  Take metoprolol (Lopressor) two hours prior to test.       After the Test: Drink plenty of water. After receiving IV contrast, you may experience a mild flushed feeling. This is normal. On occasion, you may experience a mild rash up to 24 hours after the test. This is not dangerous. If this occurs, you can take Benadryl 25 mg and increase your fluid intake. If you experience trouble breathing, this can be serious. If it is severe call 911 IMMEDIATELY. If it is mild, please call our office. If you take any of these medications: Glipizide/Metformin, Avandament, Glucavance, please do not take 48 hours after completing test unless otherwise instructed.  We will call to schedule your test 2-4 weeks out understanding that some insurance companies will need an authorization prior to the service being performed.   For non-scheduling related questions, please contact the cardiac imaging nurse navigator should you have any questions/concerns: [place], Cardiac Imaging Nurse Navigator Larey Brick, Cardiac Imaging Nurse Navigator [facility] Direct Office Dial: [phone]   For scheduling needs, including cancellations and rescheduling, please call [place], [phone].         Signed, Tonny Bollman, MD  12/13/2021 1:31 PM    [facility]

## 2022-03-14 ENCOUNTER — Ambulatory Visit (INDEPENDENT_AMBULATORY_CARE_PROVIDER_SITE_OTHER): Payer: Medicare Other

## 2022-03-14 VITALS — Wt 251.0 lb

## 2022-03-14 DIAGNOSIS — Z0000 Encounter for general adult medical examination without abnormal findings: Secondary | ICD-10-CM | POA: Diagnosis not present

## 2022-03-14 NOTE — Progress Notes (Signed)
I connected with  Wyatt Mason on 03/14/22 by a audio enabled telemedicine application and verified that I am speaking with the correct person using two identifiers.  Patient Location: Home  Provider Location: Office/Clinic  I discussed the limitations of evaluation and management by telemedicine. The patient expressed understanding and agreed to proceed.   Subjective:   Wyatt Mason is a 88 y.o. male who presents for Medicare Annual/Subsequent preventive examination.  Review of Systems     Cardiac Risk Factors include: advanced age (>40men, >47 women);obesity (BMI >30kg/m2);hypertension;dyslipidemia;male gender     Objective:    Today's Vitals   03/14/22 1053  Weight: 251 lb (113.9 kg)   Body mass index is 34.04 kg/m.     03/14/2022   10:57 AM 12/02/2019    9:02 AM 05/12/2015   12:46 PM 01/12/2015    6:42 AM 04/14/2013    8:14 PM  Advanced Directives  Does Patient Have a Medical Advance Directive? Yes Yes Yes Yes Patient has advance directive, copy not in chart  Type of Advance Directive [place];Living will [place];Living will  Living will Living will  Does patient want to make changes to medical advance directive?    No - Patient declined No  Copy of Healthcare Power of Attorney in Chart? No - copy requested No - copy requested Yes No - copy requested Copy requested from family  Pre-existing out of facility DNR order (yellow form or pink MOST form)     No    Current Medications (verified) Outpatient Encounter Medications as of 03/14/2022  Medication Sig   amLODipine (NORVASC) 5 MG tablet Take 1 tablet (5 mg total) by mouth daily.   aspirin EC 81 MG tablet Take 81 mg by mouth daily.   Cholecalciferol (VITAMIN D-3) 1000 UNITS CAPS Take 1 capsule by mouth daily.    ezetimibe (ZETIA) 10 MG tablet TAKE 1 TABLET BY MOUTH EVERY DAY   isosorbide mononitrate (IMDUR) 30 MG 24 hr tablet TAKE 1 TABLET BY MOUTH EVERY DAY   metoprolol  tartrate (LOPRESSOR) 50 MG tablet Take 1 tablet (50 mg total) by mouth 2 (two) times daily.   Multiple Vitamin (MULTIVITAMIN WITH MINERALS) TABS tablet Take 1 tablet by mouth daily.   nitroGLYCERIN (NITROSTAT) 0.4 MG SL tablet place 1 tablet under the tongue if needed every 5 minutes for 3 doses if needed for chest pain   rosuvastatin (CRESTOR) 40 MG tablet Take 1 tablet (40 mg total) by mouth daily.   amoxicillin (AMOXIL) 500 MG capsule Take 500 mg by mouth as needed (for Dental appointments).   metoprolol tartrate (LOPRESSOR) 100 MG tablet Take 1 tablet (100 mg total) by mouth once for 1 dose. Take 1 tablet (100 mg total) two hours prior to CT scan.   No facility-administered encounter medications on file as of 03/14/2022.    Allergies (verified) Other   History: Past Medical History:  Diagnosis Date   Aortic insufficiency 06/11/2021   Echo 2/23: EF 60-65, no RWMA, mild LVH, GR 1 DD, GLS -23.2, normal RVSF, normal PASP, mild-moderate AI, AV sclerosis without stenosis, ascending aortic aneurysm 4.3 cm   CAD (coronary artery disease) 04/15/2013   a. s/p DES x 2 to mid OM, residual RCA and LAD borderline CAD. b. Nuc 12/2014 - low risk.   Hyperglycemia    Hyperlipidemia    Hypertension    Obesity    Right bundle branch block 06/08/2021   Shingles 2005   Thoracic ascending  aortic aneurysm ([place]) 06/11/2021   Echo 2/23:  ascending aortic aneurysm 4.3 cm   Past Surgical History:  Procedure Laterality Date   APPENDECTOMY     CORONARY ANGIOPLASTY WITH STENT PLACEMENT  04/15/2013   sequential 90% then 95% mid OM lesions s/p DES x 2, residual 50% prox LAD and 50-70% mid RCA stenoses; LV gram deferred   KNEE ARTHROSCOPY     LEFT HEART CATHETERIZATION WITH CORONARY ANGIOGRAM N/A 04/15/2013   Procedure: LEFT HEART CATHETERIZATION WITH CORONARY ANGIOGRAM;  Surgeon: Blane Ohara, MD;  Location: [facility] CATH LAB;  Service: Cardiovascular;  Laterality: N/A;   no colonoscopy     SOC reviewed 04/29/14    TOTAL HIP ARTHROPLASTY Right 2005   Family History  Problem Relation Age of Onset   Heart attack Father        onset in 37s; died @58    CVA Neg Hx    Cancer Neg Hx    Diabetes Neg Hx    Stroke Neg Hx    Social History   Socioeconomic History   Marital status: Married    Spouse name: Not on file   Number of children: Not on file   Years of education: Not on file   Highest education level: Not on file  Occupational History   Occupation: Retired  Tobacco Use   Smoking status: Never   Smokeless tobacco: Never  Vaping Use   Vaping Use: Never used  Substance and Sexual Activity   Alcohol use: Yes    Alcohol/week: 2.0 standard drinks of alcohol    Types: 2 Shots of liquor per week    Comment: 2 to 3 times a week   Drug use: No   Sexual activity: Yes    Birth control/protection: None  Other Topics Concern   Not on file  Social History Narrative   Not on file   Social Determinants of Health   Financial Resource Strain: Low Risk  (03/14/2022)   Overall Financial Resource Strain (CARDIA)    Difficulty of Paying Living Expenses: Not hard at all  Food Insecurity: No Food Insecurity (03/14/2022)   Hunger Vital Sign    Worried About Running Out of Food in the Last Year: Never true    Ran Out of Food in the Last Year: Never true  Transportation Needs: No Transportation Needs (03/14/2022)   PRAPARE - Hydrologist (Medical): No    Lack of Transportation (Non-Medical): No  Physical Activity: Inactive (03/14/2022)   Exercise Vital Sign    Days of Exercise per Week: 0 days    Minutes of Exercise per Session: 0 min  Stress: No Stress Concern Present (03/14/2022)   [place]    Feeling of Stress : Not at all  Social Connections: Moderately Integrated (03/14/2022)   Social Connection and Isolation Panel [NHANES]    Frequency of Communication with Friends and Family: More than three  times a week    Frequency of Social Gatherings with Friends and Family: More than three times a week    Attends Religious Services: More than 4 times per year    Active Member of [employer] or Organizations: No    Attends Archivist Meetings: Never    Marital Status: Married    Tobacco Counseling Counseling given: Not Answered   Clinical Intake:  Pre-visit preparation completed: Yes  Pain : No/denies pain     BMI - recorded: 34.04 Nutritional Status: BMI >  30  Obese Nutritional Risks: None Diabetes: No  How often do you need to have someone help you when you read instructions, pamphlets, or other written materials from your doctor or pharmacy?: 1 - Never  Diabetic?no  Interpreter Needed?: No  Information entered by :: Charlott Rakes, LPN   Activities of Daily Living    03/14/2022   10:59 AM  In your present state of health, do you have any difficulty performing the following activities:  Hearing? 0  Vision? 0  Difficulty concentrating or making decisions? 0  Walking or climbing stairs? 0  Dressing or bathing? 0  Doing errands, shopping? 0  Preparing Food and eating ? N  Using the Toilet? N  In the past six months, have you accidently leaked urine? N  Do you have problems with loss of bowel control? N  Managing your Medications? N  Managing your Finances? N  Housekeeping or managing your Housekeeping? N    Patient Care Team: Vivi Barrack, MD as PCP - General (Family Medicine) Sherren Mocha, MD as PCP - Cardiology (Cardiology)  Indicate any recent Medical Services you may have received from other than [facility] providers in the past year (date may be approximate).     Assessment:   This is a routine wellness examination for [place].  Hearing/Vision screen Hearing Screening - Comments:: Pt denies any hearing issues  Vision Screening - Comments:: Pt follows up with Dr Gerrit Heck for annual eye exams   Dietary issues and exercise activities  discussed: Current Exercise Habits: The patient does not participate in regular exercise at present   Goals Addressed   None    Depression Screen    03/14/2022   10:56 AM 12/02/2019    9:00 AM 11/28/2018    9:11 AM 09/06/2017   10:22 AM 05/12/2015   12:48 PM 04/29/2014    9:47 AM 05/06/2013    9:24 AM  PHQ 2/9 Scores  PHQ - 2 Score 0 0 0 2 0 0 0  PHQ- 9 Score    5       Fall Risk    03/14/2022   10:59 AM 12/02/2019    9:04 AM 11/28/2018    9:11 AM 09/06/2017   10:22 AM 05/12/2015   12:48 PM  Fall Risk   Falls in the past year? 0 1 0 No No  Number falls in past yr: 0 1     Injury with Fall? 0 1     Comment  compression FX to back     Risk for fall due to : No Fall Risks History of fall(s);Impaired vision     Follow up Falls prevention discussed Falls prevention discussed       FALL RISK PREVENTION PERTAINING TO THE HOME:  Any stairs in or around the home? Yes  If so, are there any without handrails? No  Home free of loose throw rugs in walkways, pet beds, electrical cords, etc? Yes  Adequate lighting in your home to reduce risk of falls? Yes   ASSISTIVE DEVICES UTILIZED TO PREVENT FALLS:  Life alert? Yes Use of a cane, walker or w/c? No  Grab bars in the bathroom? Yes  Shower chair or bench in shower? Yes  Elevated toilet seat or a handicapped toilet? No   TIMED UP AND GO:  Was the test performed? No .   Cognitive Function:        03/14/2022   10:59 AM 12/02/2019    9:07 AM  6CIT Screen  What Year? 0 points 0 points  What month? 0 points 0 points  What time? 0 points   Count back from 20 0 points 0 points  Months in reverse 0 points 0 points  Repeat phrase 0 points 0 points  Total Score 0 points     Immunizations Immunization History  Administered Date(s) Administered   Fluad Quad(high Dose 65+) 02/03/2020   Influenza, Seasonal, Injecte, Preservative Fre 12/14/2018   Influenza,inj,Quad PF,6+ Mos 01/12/2015   Influenza,inj,quad, With Preservative  01/23/2017, 01/22/2018, 12/14/2018   Influenza-Unspecified 01/21/2016, 02/22/2021, 01/21/2022   PFIZER(Purple Top)SARS-COV-2 Vaccination 05/19/2019, 06/06/2019, 02/03/2020   Pneumococcal Conjugate-13 11/28/2018   Pneumococcal Polysaccharide-23 01/12/2015   Td 06/24/2002   Tetanus 09/02/2014    TDAP status: Due, Education has been provided regarding the importance of this vaccine. Advised may receive this vaccine at local pharmacy or Health Dept. Aware to provide a copy of the vaccination record if obtained from local pharmacy or Health Dept. Verbalized acceptance and understanding.  Flu Vaccine status: Up to date  Pneumococcal vaccine status: Up to date  Covid-19 vaccine status: Completed vaccines  Qualifies for Shingles Vaccine? Yes   Zostavax completed No   Shingrix Completed?: No.    Education has been provided regarding the importance of this vaccine. Patient has been advised to call insurance company to determine out of pocket expense if they have not yet received this vaccine. Advised may also receive vaccine at local pharmacy or Health Dept. Verbalized acceptance and understanding.  Screening Tests Health Maintenance  Topic Date Due   Zoster Vaccines- Shingrix (1 of 2) Never done   COVID-19 Vaccine (4 - Pfizer risk series) 03/30/2020   Medicare Annual Wellness (AWV)  03/15/2023   Pneumonia Vaccine 60+ Years old  Completed   INFLUENZA VACCINE  Completed   HPV VACCINES  Aged Out    Health Maintenance  Health Maintenance Due  Topic Date Due   Zoster Vaccines- Shingrix (1 of 2) Never done   COVID-19 Vaccine (4 - Pfizer risk series) 03/30/2020    Colorectal cancer screening: No longer required.   Additional Screening:   Vision Screening: Recommended annual ophthalmology exams for early detection of glaucoma and other disorders of the eye. Is the patient up to date with their annual eye exam?  Yes  Who is the provider or what is the name of the office in which the  patient attends annual eye exams? Dr  If pt is not established with a provider, would they like to be referred to a provider to establish care? No .   Dental Screening: Recommended annual dental exams for proper oral hygiene  Community Resource Referral / Chronic Care Management: CRR required this visit?  No   CCM required this visit?  No      Plan:     I have personally reviewed and noted the following in the patient's chart:   Medical and social history Use of alcohol, tobacco or illicit drugs  Current medications and supplements including opioid prescriptions. Patient is not currently taking opioid prescriptions. Functional ability and status Nutritional status Physical activity Advanced directives List of other physicians Hospitalizations, surgeries, and ER visits in previous 12 months Vitals Screenings to include cognitive, depression, and falls Referrals and appointments  In addition, I have reviewed and discussed with patient certain preventive protocols, quality metrics, and best practice recommendations. A written personalized care plan for preventive services as well as general preventive health recommendations were provided to patient.     Landry Corporal  Wallice Granville, LPN   [id number]/[id number]   Nurse Notes: none

## 2022-03-16 DIAGNOSIS — M25562 Pain in left knee: Secondary | ICD-10-CM | POA: Diagnosis not present

## 2022-03-21 NOTE — Patient Instructions (Signed)
Wyatt Mason , Thank you for taking time to come for your Medicare Wellness Visit. I appreciate your ongoing commitment to your health goals. Please review the following plan we discussed and let me know if I can assist you in the future.   These are the goals we discussed:  Goals       patient (pt-stated)      Plan to go to the [facility] more often;       Patient Stated      Stay healthy        This is a list of the screening recommended for you and due dates:  Health Maintenance  Topic Date Due   Zoster (Shingles) Vaccine (1 of 2) Never done   COVID-19 Vaccine (4 - 2023-24 season) 12/24/2021   Medicare Annual Wellness Visit  03/15/2023   Pneumonia Vaccine  Completed   Flu Shot  Completed   HPV Vaccine  Aged Out    Advanced directives: Please bring a copy of your health care power of attorney and living will to the office at your convenience.  Conditions/risks identified: stay healthy   Next appointment: Follow up in one year for your annual wellness visit.   Preventive Care 52 Years and Older, Male  Preventive care refers to lifestyle choices and visits with your health care provider that can promote health and wellness. What does preventive care include? A yearly physical exam. This is also called an annual well check. Dental exams once or twice a year. Routine eye exams. Ask your health care provider how often you should have your eyes checked. Personal lifestyle choices, including: Daily care of your teeth and gums. Regular physical activity. Eating a healthy diet. Avoiding tobacco and drug use. Limiting alcohol use. Practicing safe sex. Taking low doses of aspirin every day. Taking vitamin and mineral supplements as recommended by your health care provider. What happens during an annual well check? The services and screenings done by your health care provider during your annual well check will depend on your age, overall health, lifestyle risk factors, and family  history of disease. Counseling  Your health care provider may ask you questions about your: Alcohol use. Tobacco use. Drug use. Emotional well-being. Home and relationship well-being. Sexual activity. Eating habits. History of falls. Memory and ability to understand (cognition). Work and work Astronomer. Screening  You may have the following tests or measurements: Height, weight, and BMI. Blood pressure. Lipid and cholesterol levels. These may be checked every 5 years, or more frequently if you are over 54 years old. Skin check. Lung cancer screening. You may have this screening every year starting at age 20 if you have a 30-pack-year history of smoking and currently smoke or have quit within the past 15 years. Fecal occult blood test (FOBT) of the stool. You may have this test every year starting at age 47. Flexible sigmoidoscopy or colonoscopy. You may have a sigmoidoscopy every 5 years or a colonoscopy every 10 years starting at age 1. Prostate cancer screening. Recommendations will vary depending on your family history and other risks. Hepatitis C blood test. Hepatitis B blood test. Sexually transmitted disease (STD) testing. Diabetes screening. This is done by checking your blood sugar (glucose) after you have not eaten for a while (fasting). You may have this done every 1-3 years. Abdominal aortic aneurysm (AAA) screening. You may need this if you are a current or former smoker. Osteoporosis. You may be screened starting at age 56 if you are at  high risk. Talk with your health care provider about your test results, treatment options, and if necessary, the need for more tests. Vaccines  Your health care provider may recommend certain vaccines, such as: Influenza vaccine. This is recommended every year. Tetanus, diphtheria, and acellular pertussis (Tdap, Td) vaccine. You may need a Td booster every 10 years. Zoster vaccine. You may need this after age 43. Pneumococcal  13-valent conjugate (PCV13) vaccine. One dose is recommended after age 50. Pneumococcal polysaccharide (PPSV23) vaccine. One dose is recommended after age 11. Talk to your health care provider about which screenings and vaccines you need and how often you need them. This information is not intended to replace advice given to you by your health care provider. Make sure you discuss any questions you have with your health care provider. Document Released: 05/08/2015 Document Revised: 12/30/2015 Document Reviewed: 02/10/2015 Elsevier Interactive Patient Education  2017 [place] Prevention in the Home Falls can cause injuries. They can happen to people of all ages. There are many things you can do to make your home safe and to help prevent falls. What can I do on the outside of my home? Regularly fix the edges of walkways and driveways and fix any cracks. Remove anything that might make you trip as you walk through a door, such as a raised step or threshold. Trim any bushes or trees on the path to your home. Use bright outdoor lighting. Clear any walking paths of anything that might make someone trip, such as rocks or tools. Regularly check to see if handrails are loose or broken. Make sure that both sides of any steps have handrails. Any raised decks and porches should have guardrails on the edges. Have any leaves, snow, or ice cleared regularly. Use sand or salt on walking paths during winter. Clean up any spills in your garage right away. This includes oil or grease spills. What can I do in the bathroom? Use night lights. Install grab bars by the toilet and in the tub and shower. Do not use towel bars as grab bars. Use non-skid mats or decals in the tub or shower. If you need to sit down in the shower, use a plastic, non-slip stool. Keep the floor dry. Clean up any water that spills on the floor as soon as it happens. Remove soap buildup in the tub or shower regularly. Attach  bath mats securely with double-sided non-slip rug tape. Do not have throw rugs and other things on the floor that can make you trip. What can I do in the bedroom? Use night lights. Make sure that you have a light by your bed that is easy to reach. Do not use any sheets or blankets that are too big for your bed. They should not hang down onto the floor. Have a firm chair that has side arms. You can use this for support while you get dressed. Do not have throw rugs and other things on the floor that can make you trip. What can I do in the kitchen? Clean up any spills right away. Avoid walking on wet floors. Keep items that you use a lot in easy-to-reach places. If you need to reach something above you, use a strong step stool that has a grab bar. Keep electrical cords out of the way. Do not use floor polish or wax that makes floors slippery. If you must use wax, use non-skid floor wax. Do not have throw rugs and other things on the floor that  can make you trip. What can I do with my stairs? Do not leave any items on the stairs. Make sure that there are handrails on both sides of the stairs and use them. Fix handrails that are broken or loose. Make sure that handrails are as long as the stairways. Check any carpeting to make sure that it is firmly attached to the stairs. Fix any carpet that is loose or worn. Avoid having throw rugs at the top or bottom of the stairs. If you do have throw rugs, attach them to the floor with carpet tape. Make sure that you have a light switch at the top of the stairs and the bottom of the stairs. If you do not have them, ask someone to add them for you. What else can I do to help prevent falls? Wear shoes that: Do not have high heels. Have rubber bottoms. Are comfortable and fit you well. Are closed at the toe. Do not wear sandals. If you use a stepladder: Make sure that it is fully opened. Do not climb a closed stepladder. Make sure that both sides of the  stepladder are locked into place. Ask someone to hold it for you, if possible. Clearly mark and make sure that you can see: Any grab bars or handrails. First and last steps. Where the edge of each step is. Use tools that help you move around (mobility aids) if they are needed. These include: Canes. Walkers. Scooters. Crutches. Turn on the lights when you go into a dark area. Replace any light bulbs as soon as they burn out. Set up your furniture so you have a clear path. Avoid moving your furniture around. If any of your floors are uneven, fix them. If there are any pets around you, be aware of where they are. Review your medicines with your doctor. Some medicines can make you feel dizzy. This can increase your chance of falling. Ask your doctor what other things that you can do to help prevent falls. This information is not intended to replace advice given to you by your health care provider. Make sure you discuss any questions you have with your health care provider. Document Released: 02/05/2009 Document Revised: 09/17/2015 Document Reviewed: 05/16/2014 Elsevier Interactive Patient Education  2017 [employer].

## 2022-05-25 ENCOUNTER — Telehealth (HOSPITAL_COMMUNITY): Payer: Self-pay | Admitting: *Deleted

## 2022-05-25 ENCOUNTER — Other Ambulatory Visit: Payer: Self-pay | Admitting: Cardiovascular Disease

## 2022-05-25 DIAGNOSIS — I77819 Aortic ectasia, unspecified site: Secondary | ICD-10-CM

## 2022-05-25 NOTE — Telephone Encounter (Signed)
Calling patient to tell him that he does not need his cardiac CT scan. Instead we have changed his CT scan to CTA aorta at the same time as his CCTA. Patient is aware he is to arrive at 9am and only to take his daily medications. He denies allergy to IV dye.   Gordy Clement RN Navigator [facility] [phone] Office [phone] Cell

## 2022-05-26 ENCOUNTER — Ambulatory Visit (HOSPITAL_COMMUNITY): Payer: Medicare Other

## 2022-05-26 ENCOUNTER — Ambulatory Visit (HOSPITAL_COMMUNITY): Admission: RE | Admit: 2022-05-26 | Payer: Medicare Other | Source: Ambulatory Visit

## 2022-05-26 ENCOUNTER — Ambulatory Visit (HOSPITAL_COMMUNITY)
Admission: RE | Admit: 2022-05-26 | Discharge: 2022-05-26 | Disposition: A | Discharge status: Home / Self Care | Payer: Medicare Other | Source: Ambulatory Visit | Attending: Cardiovascular Disease | Admitting: Cardiovascular Disease

## 2022-05-26 ENCOUNTER — Other Ambulatory Visit (HOSPITAL_COMMUNITY): Payer: Medicare Other

## 2022-05-26 ENCOUNTER — Ambulatory Visit (HOSPITAL_BASED_OUTPATIENT_CLINIC_OR_DEPARTMENT_OTHER)
Admission: RE | Admit: 2022-05-26 | Discharge: 2022-05-26 | Disposition: A | Discharge status: Home / Self Care | Payer: Medicare Other | Source: Ambulatory Visit | Attending: Physician Assistant | Admitting: Physician Assistant

## 2022-05-26 DIAGNOSIS — I77819 Aortic ectasia, unspecified site: Secondary | ICD-10-CM | POA: Diagnosis not present

## 2022-05-26 DIAGNOSIS — I7789 Other specified disorders of arteries and arterioles: Secondary | ICD-10-CM

## 2022-05-26 DIAGNOSIS — I351 Nonrheumatic aortic (valve) insufficiency: Secondary | ICD-10-CM

## 2022-05-26 DIAGNOSIS — I350 Nonrheumatic aortic (valve) stenosis: Secondary | ICD-10-CM | POA: Diagnosis not present

## 2022-05-26 LAB — ECHOCARDIOGRAM COMPLETE
Area-P 1/2: 6.48 cm2
P 1/2 time: 1026 msec
S' Lateral: 3.4 cm

## 2022-05-26 MED ORDER — IOHEXOL 350 MG/ML SOLN
75.0000 mL | Freq: Once | INTRAVENOUS | Status: AC | PRN
  Administered 2022-05-26: 75 mL via INTRAVENOUS

## 2022-06-09 NOTE — Progress Notes (Signed)
Cardiology Office Note:    Date:  06/10/2022   ID:  Wyatt Mason, DOB 1932/07/20, MRN [id number]  PCP:  Vivi Barrack, MD  [place] Prov[facility] Cardiologist:  Sherren Mocha, MD    Referring MD: Vivi Barrack, MD   Patient Profile: Coronary artery disease  NSTEMI s/p DES x 2 to OM in 2014 Myoview 01/12/2015: No ischemia, low risk, EF 55-65 Aortic insufficiency TTE 05/26/2022: EF 60-65, no RWMA, GR 1 DD, normal RVSF, normal PASP, mild BAE, trivial MR, mild AI, ascending aorta 43 mm, RAP 3 Thoracic aortic aneurysm Chest CTA 05/26/2022: Ascending thoracic aorta 4.1 cm Hypertension  Hyperlipidemia  Hx of lumbar compression fx S/p L knee surgery  Right Bundle Branch Block      History of Present Illness:   Wyatt Mason is a 89 y.o. male with the above problem list.  He was last seen by Dr. Burt Knack 12/13/2021.  He returns for follow-up on CAD, thoracic aortic aneurysm.  He is here alone.  We reviewed the findings of his recent CT scan and echocardiogram.  He is doing well without chest pain, significant shortness of breath, syncope, orthopnea.  He has chronic lower extremity without significant change.  EKG: NSR, HR 80, normal axis, right bundle branch block, QTc 491, no change from prior tracing   Subjective    Reviewed and updated this encounter:  Tobacco  Allergies  Meds  Problems  Med Hx  Surg Hx  Fam Hx     ROS  Objective   Labs/Other Test Reviewed:   Recent Labs: 09/09/2021: ALT 17   Recent Lipid Panel Recent Labs    09/09/21 1141  CHOL 166  TRIG 161*  HDL 78  LDLCALC 62     Risk Assessment/Calculations/Metrics:         HYPERTENSION CONTROL Vitals:   06/10/22 0818 06/10/22 0856  BP: (!) 143/62 (!) 150/80    The patient's blood pressure is elevated above target today.  In order to address the patient's elevated BP: Blood pressure will be monitored at home to determine if medication changes need to be made.      Physical Exam:   VS:   BP (!) 150/80   Pulse 80   Ht 6' (1.829 m)   Wt 247 lb 3.2 oz (112.1 kg)   SpO2 97%   BMI 33.53 kg/m    Wt Readings from Last 3 Encounters:  06/10/22 247 lb 3.2 oz (112.1 kg)  03/14/22 251 lb (113.9 kg)  12/13/21 251 lb 3.2 oz (113.9 kg)    Constitutional:      Appearance: Healthy appearance. Not in distress.  Neck:     Vascular: JVD normal.  Pulmonary:     Breath sounds: Normal breath sounds. No wheezing. No rales.  Cardiovascular:     Regular rhythm. Normal S1. Normal S2.      Murmurs: There is a grade 1/6 systolic murmur at the LLSB.  Edema:    Peripheral edema present.    Ankle: bilateral 1+ edema of the ankle. Abdominal:     Palpations: Abdomen is soft.     Assessment & Plan    ASSESSMENT & PLAN:   CAD (coronary artery disease), native coronary artery History of non-STEMI in 2014 treated with DES x2 to the OM.  He had moderate nonobstructive disease elsewhere at that time.  Stress test in 2016 was low risk.  He is not currently having chest symptoms to suggest angina.  Echocardiogram done last  year for new right bundle branch block demonstrated normal LV function.  Continue amlodipine 5 mg daily, aspirin 81 mg daily, Imdur 30 mg daily, metoprolol tartrate 50 mg twice daily, Crestor 40 mg daily.  Follow-up in 6 months.  HYPERLIPIDEMIA LDL in May 2023 was 62.  He is fasting today.  Obtain follow-up fasting CMET, lipids.  Continue Zetia 10 mg daily, Crestor 40 mg daily.  Essential hypertension Blood pressure above target.  He has not had any medications yet today.  I have asked him to monitor his blood pressure over the next couple weeks.  If his blood pressure remains above target, consider increasing isosorbide.  Continue current dose of amlodipine, isosorbide, metoprolol tartrate.  Aortic insufficiency Mild by recent echocardiogram.  Consider repeat echocardiogram in 2 to 3 years.  Thoracic ascending aortic aneurysm 4.1 cm by recent CT scan.  Consider repeat CT scan  again in 2 to 3 years.             Dispo:  Return in about 6 months (around 12/09/2022) for Routine Follow Up, w/ Dr. Burt Knack.   Signed, Richardson Dopp, PA-C  06/10/2022 9:01 AM    [facility] [address], [place], [place]  [address] Phone: [phone]; Fax: [phone]

## 2022-06-10 ENCOUNTER — Ambulatory Visit: Payer: Medicare Other | Attending: Physician Assistant | Admitting: Physician Assistant

## 2022-06-10 ENCOUNTER — Encounter: Payer: Self-pay | Admitting: Physician Assistant

## 2022-06-10 VITALS — BP 150/80 | HR 80 | Ht 72.0 in | Wt 247.2 lb

## 2022-06-10 DIAGNOSIS — I351 Nonrheumatic aortic (valve) insufficiency: Secondary | ICD-10-CM | POA: Diagnosis not present

## 2022-06-10 DIAGNOSIS — I10 Essential (primary) hypertension: Secondary | ICD-10-CM

## 2022-06-10 DIAGNOSIS — I25119 Atherosclerotic heart disease of native coronary artery with unspecified angina pectoris: Secondary | ICD-10-CM | POA: Diagnosis not present

## 2022-06-10 DIAGNOSIS — I7121 Aneurysm of the ascending aorta, without rupture: Secondary | ICD-10-CM | POA: Diagnosis not present

## 2022-06-10 DIAGNOSIS — E782 Mixed hyperlipidemia: Secondary | ICD-10-CM | POA: Diagnosis not present

## 2022-06-10 NOTE — Patient Instructions (Addendum)
Medication Instructions:  Your physician recommends that you continue on your current medications as directed. Please refer to the Current Medication list given to you today.  *If you need a refill on your cardiac medications before your next appointment, please call your pharmacy*   Lab Work: TODAY:  CMET & LIPID  If you have labs (blood work) drawn today and your tests are completely normal, you will receive your results only by: [place] (if you have MyChart) OR A paper copy in the mail If you have any lab test that is abnormal or we need to change your treatment, we will call you to review the results.   Testing/Procedures: None ordered   Follow-Up: At [facility], you and your health needs are our priority.  As part of our continuing mission to provide you with exceptional heart care, we have created designated Provider Care Teams.  These Care Teams include your primary Cardiologist (physician) and Advanced Practice Providers (APPs -  Physician Assistants and Nurse Practitioners) who all work together to provide you with the care you need, when you need it.  We recommend signing up for the patient portal called "MyChart".  Sign up information is provided on this After Visit Summary.  MyChart is used to connect with patients for Virtual Visits (Telemedicine).  Patients are able to view lab/test results, encounter notes, upcoming appointments, etc.  Non-urgent messages can be sent to your provider as well.   To learn more about what you can do with MyChart, go to [email or web address].    Your next appointment:   6 month(s)  Provider:   Sherren Mocha, MD     Other Instructions  Your physician has requested that you regularly monitor and record your blood pressure readings at home. Please use the same machine at the same time of day to check your readings and record them to bring to your follow-up visit.   Please monitor blood pressures and keep a log of your  readings for 2 weeks and call them into me.    Make sure to check 2 hours after your medications.    AVOID these things for 30 minutes before checking your blood pressure: No Drinking caffeine. No Drinking alcohol. No Eating. No Smoking. No Exercising.   Five minutes before checking your blood pressure: Pee. Sit in a dining chair. Avoid sitting in a soft couch or armchair. Be quiet. Do not talk

## 2022-06-10 NOTE — Assessment & Plan Note (Signed)
LDL in May 2023 was 62.  He is fasting today.  Obtain follow-up fasting CMET, lipids.  Continue Zetia 10 mg daily, Crestor 40 mg daily.

## 2022-06-10 NOTE — Assessment & Plan Note (Signed)
4.1 cm by recent CT scan.  Consider repeat CT scan again in 2 to 3 years.

## 2022-06-10 NOTE — Assessment & Plan Note (Signed)
History of non-STEMI in 2014 treated with DES x2 to the OM.  He had moderate nonobstructive disease elsewhere at that time.  Stress test in 2016 was low risk.  He is not currently having chest symptoms to suggest angina.  Echocardiogram done last year for new right bundle branch block demonstrated normal LV function.  Continue amlodipine 5 mg daily, aspirin 81 mg daily, Imdur 30 mg daily, metoprolol tartrate 50 mg twice daily, Crestor 40 mg daily.  Follow-up in 6 months.

## 2022-06-10 NOTE — Assessment & Plan Note (Signed)
Mild by recent echocardiogram.  Consider repeat echocardiogram in 2 to 3 years.

## 2022-06-10 NOTE — Assessment & Plan Note (Signed)
Blood pressure above target.  He has not had any medications yet today.  I have asked him to monitor his blood pressure over the next couple weeks.  If his blood pressure remains above target, consider increasing isosorbide.  Continue current dose of amlodipine, isosorbide, metoprolol tartrate.

## 2022-06-11 LAB — COMPREHENSIVE METABOLIC PANEL
ALT: 19 IU/L (ref 0–44)
AST: 21 IU/L (ref 0–40)
Albumin/Globulin Ratio: 2 (ref 1.2–2.2)
Albumin: 4.3 g/dL (ref 3.7–4.7)
Alkaline Phosphatase: 68 IU/L (ref 44–121)
BUN/Creatinine Ratio: 22 (ref 10–24)
BUN: 18 mg/dL (ref 8–27)
Bilirubin Total: 0.9 mg/dL (ref 0.0–1.2)
CO2: 27 mmol/L (ref 20–29)
Calcium: 8.9 mg/dL (ref 8.6–10.2)
Chloride: 103 mmol/L (ref 96–106)
Creatinine, Ser: 0.81 mg/dL (ref 0.76–1.27)
Globulin, Total: 2.1 g/dL (ref 1.5–4.5)
Glucose: 108 mg/dL — ABNORMAL HIGH (ref 70–99)
Potassium: 4.1 mmol/L (ref 3.5–5.2)
Sodium: 143 mmol/L (ref 134–144)
Total Protein: 6.4 g/dL (ref 6.0–8.5)
eGFR: 85 mL/min/{1.73_m2} (ref >59)

## 2022-06-11 LAB — LIPID PANEL
Chol/HDL Ratio: 2.1 ratio (ref 0.0–5.0)
Cholesterol, Total: 196 mg/dL (ref 100–199)
HDL: 94 mg/dL (ref >39)
LDL Chol Calc (NIH): 92 mg/dL (ref 0–99)
Triglycerides: 53 mg/dL (ref 0–149)
VLDL Cholesterol Cal: 10 mg/dL (ref 5–40)

## 2022-06-17 ENCOUNTER — Other Ambulatory Visit: Payer: Medicare Other

## 2022-06-24 ENCOUNTER — Inpatient Hospital Stay: Admission: RE | Admit: 2022-06-24 | Payer: Medicare Other | Source: Ambulatory Visit

## 2022-06-29 ENCOUNTER — Telehealth: Payer: Self-pay | Admitting: *Deleted

## 2022-06-29 DIAGNOSIS — E782 Mixed hyperlipidemia: Secondary | ICD-10-CM

## 2022-06-29 NOTE — Telephone Encounter (Signed)
-----   Message from Liliane Shi, [place] sent at 06/11/2022 12:15 PM EST ----- Creatinine, K+, LFTs normal.  LDL increased since last year and now above goal. I would not change medications unless his LDL remains above target on follow-up PLAN:  -Continue current medications -Continue to work on diet -Repeat fasting lipids in 6 months Richardson Dopp, PA-C    06/11/2022 12:14 PM

## 2022-06-29 NOTE — Telephone Encounter (Signed)
Pt has been made aware of his lab results.

## 2022-07-01 ENCOUNTER — Telehealth: Payer: Self-pay | Admitting: *Deleted

## 2022-07-01 NOTE — Telephone Encounter (Signed)
A message was left re: scheduling his ct chest.

## 2022-07-03 ENCOUNTER — Other Ambulatory Visit: Payer: Self-pay | Admitting: Cardiovascular Disease

## 2022-07-06 NOTE — Telephone Encounter (Signed)
Patient returned RN's call. 

## 2022-07-27 DIAGNOSIS — L570 Actinic keratosis: Secondary | ICD-10-CM | POA: Diagnosis not present

## 2022-07-27 DIAGNOSIS — Z85828 Personal history of other malignant neoplasm of skin: Secondary | ICD-10-CM | POA: Diagnosis not present

## 2022-07-27 DIAGNOSIS — L218 Other seborrheic dermatitis: Secondary | ICD-10-CM | POA: Diagnosis not present

## 2022-07-27 DIAGNOSIS — L821 Other seborrheic keratosis: Secondary | ICD-10-CM | POA: Diagnosis not present

## 2022-08-19 ENCOUNTER — Other Ambulatory Visit: Payer: Self-pay | Admitting: Physician Assistant

## 2022-08-19 DIAGNOSIS — M1712 Unilateral primary osteoarthritis, left knee: Secondary | ICD-10-CM | POA: Diagnosis not present

## 2022-08-29 ENCOUNTER — Other Ambulatory Visit: Payer: Self-pay | Admitting: Physician Assistant

## 2022-11-10 DIAGNOSIS — R42 Dizziness and giddiness: Secondary | ICD-10-CM | POA: Diagnosis not present

## 2022-11-10 DIAGNOSIS — I4510 Unspecified right bundle-branch block: Secondary | ICD-10-CM | POA: Diagnosis not present

## 2022-11-10 DIAGNOSIS — R55 Syncope and collapse: Secondary | ICD-10-CM | POA: Diagnosis not present

## 2022-11-10 DIAGNOSIS — I10 Essential (primary) hypertension: Secondary | ICD-10-CM | POA: Diagnosis not present

## 2022-12-09 ENCOUNTER — Telehealth: Payer: Self-pay | Admitting: Family Medicine

## 2022-12-09 NOTE — Telephone Encounter (Signed)
 Called patient to schedule next available cpe. No answer left VM

## 2022-12-15 ENCOUNTER — Encounter: Payer: Self-pay | Admitting: Cardiovascular Disease

## 2022-12-15 ENCOUNTER — Ambulatory Visit: Payer: Medicare Other | Admitting: Cardiovascular Disease

## 2022-12-15 VITALS — BP 195/98 | HR 87 | Ht 72.0 in | Wt 250.0 lb

## 2022-12-15 DIAGNOSIS — I7121 Aneurysm of the ascending aorta, without rupture: Secondary | ICD-10-CM

## 2022-12-15 DIAGNOSIS — I10 Essential (primary) hypertension: Secondary | ICD-10-CM

## 2022-12-15 DIAGNOSIS — I25119 Atherosclerotic heart disease of native coronary artery with unspecified angina pectoris: Secondary | ICD-10-CM

## 2022-12-15 DIAGNOSIS — E782 Mixed hyperlipidemia: Secondary | ICD-10-CM

## 2022-12-15 DIAGNOSIS — I351 Nonrheumatic aortic (valve) insufficiency: Secondary | ICD-10-CM

## 2022-12-15 MED ORDER — LOSARTAN POTASSIUM 50 MG PO TABS: 50.0000 mg | ORAL_TABLET | Freq: Every day | ORAL | 3 refills | Status: DC

## 2022-12-15 MED ORDER — EZETIMIBE 10 MG PO TABS: 10.0000 mg | ORAL_TABLET | Freq: Every day | ORAL | 3 refills | Status: DC

## 2022-12-15 MED ORDER — ISOSORBIDE MONONITRATE ER 30 MG PO TB24: 30.0000 mg | ORAL_TABLET | Freq: Every day | ORAL | 3 refills | Status: DC

## 2022-12-15 MED ORDER — AMLODIPINE BESYLATE 5 MG PO TABS: 5.0000 mg | ORAL_TABLET | Freq: Every day | ORAL | 3 refills | Status: DC

## 2022-12-15 NOTE — Progress Notes (Signed)
Cardiology Office Note:    Date:  12/15/2022   ID:  Wyatt Mason, DOB 01-05-1933, MRN [id number]  PCP:  Ardith Dark, MD   [facility] Providers Cardiologist:  Tonny Bollman, MD     Referring MD: Ardith Dark, MD   Chief Complaint  Patient presents with   Coronary Artery Disease    History of Present Illness:    Wyatt Mason is a 89 y.o. male with a hx of:  Coronary artery disease  NSTEMI s/p DES x 2 to OM in 2014 Myoview 01/12/2015: No ischemia, low risk, EF 55-65 Aortic insufficiency TTE 05/26/2022: EF 60-65, no RWMA, GR 1 DD, normal RVSF, normal PASP, mild BAE, trivial MR, mild AI, ascending aorta 43 mm, RAP 3 Thoracic aortic aneurysm Chest CTA 05/26/2022: Ascending thoracic aorta 4.1 cm Hypertension  Hyperlipidemia  Hx of lumbar compression fx S/p L knee surgery  Right Bundle Branch Block   The patient is here alone today.  He has been having a difficult time because of his wife's progressive dementia.  He has not been taking his antihypertensive medications and has been inconsistent with all of his medicines.  He denies symptoms of chest pain, chest pressure, or shortness of breath with any of his normal activities.  He denies leg edema, orthopnea, or PND.  Past Medical History:  Diagnosis Date   Aortic insufficiency 06/11/2021   Echo 2/23: EF 60-65, no RWMA, mild LVH, GR 1 DD, GLS -23.2, normal RVSF, normal PASP, mild-moderate AI, AV sclerosis without stenosis, ascending aortic aneurysm 4.3 cm   CAD (coronary artery disease) 04/15/2013   a. s/p DES x 2 to mid OM, residual RCA and LAD borderline CAD. b. Nuc 12/2014 - low risk.   Hyperglycemia    Hyperlipidemia    Hypertension    Obesity    Right bundle branch block 06/08/2021   Shingles 2005   Thoracic ascending aortic aneurysm (HCC) 06/11/2021   Echo 2/23:  ascending aortic aneurysm 4.3 cm    Past Surgical History:  Procedure Laterality Date   APPENDECTOMY     CORONARY ANGIOPLASTY WITH STENT  PLACEMENT  04/15/2013   sequential 90% then 95% mid OM lesions s/p DES x 2, residual 50% prox LAD and 50-70% mid RCA stenoses; LV gram deferred   KNEE ARTHROSCOPY     LEFT HEART CATHETERIZATION WITH CORONARY ANGIOGRAM N/A 04/15/2013   Procedure: LEFT HEART CATHETERIZATION WITH CORONARY ANGIOGRAM;  Surgeon: Micheline Chapman, MD;  Location: [facility] CATH LAB;  Service: Cardiovascular;  Laterality: N/A;   no colonoscopy     SOC reviewed 04/29/14   TOTAL HIP ARTHROPLASTY Right 2005    Current Medications: Current Meds  Medication Sig   amoxicillin (AMOXIL) 500 MG capsule Take 500 mg by mouth as needed (for Dental appointments).   aspirin EC 81 MG tablet Take 81 mg by mouth daily.   Cholecalciferol (VITAMIN D-3) 1000 UNITS CAPS Take 1 capsule by mouth daily.    losartan (COZAAR) 50 MG tablet Take 1 tablet (50 mg total) by mouth daily.   metoprolol tartrate (LOPRESSOR) 50 MG tablet Take 1 tablet (50 mg total) by mouth 2 (two) times daily.   Multiple Vitamin (MULTIVITAMIN WITH MINERALS) TABS tablet Take 1 tablet by mouth daily.   nitroGLYCERIN (NITROSTAT) 0.4 MG SL tablet place 1 tablet under the tongue if needed every 5 minutes for 3 doses if needed for chest pain   rosuvastatin (CRESTOR) 40 MG tablet Take 1 tablet (40 mg total)  by mouth daily.   [DISCONTINUED] amLODipine (NORVASC) 5 MG tablet Take 1 tablet (5 mg total) by mouth daily.   [DISCONTINUED] ezetimibe (ZETIA) 10 MG tablet TAKE 1 TABLET BY MOUTH EVERY DAY   [DISCONTINUED] isosorbide mononitrate (IMDUR) 30 MG 24 hr tablet TAKE 1 TABLET BY MOUTH EVERY DAY     Allergies:   Other   Social History   Socioeconomic History   Marital status: Married    Spouse name: Not on file   Number of children: Not on file   Years of education: Not on file   Highest education level: Not on file  Occupational History   Occupation: Retired  Tobacco Use   Smoking status: Never   Smokeless tobacco: Never  Vaping Use   Vaping status: Never Used   Substance and Sexual Activity   Alcohol use: Yes    Alcohol/week: 2.0 standard drinks of alcohol    Types: 2 Shots of liquor per week    Comment: 2 to 3 times a week   Drug use: No   Sexual activity: Yes    Birth control/protection: None  Other Topics Concern   Not on file  Social History Narrative   Not on file   Social Determinants of Health   Financial Resource Strain: Low Risk  (03/14/2022)   Overall Financial Resource Strain (CARDIA)    Difficulty of Paying Living Expenses: Not hard at all  Food Insecurity: No Food Insecurity (03/14/2022)   Hunger Vital Sign    Worried About Running Out of Food in the Last Year: Never true    Ran Out of Food in the Last Year: Never true  Transportation Needs: No Transportation Needs (03/14/2022)   PRAPARE - Administrator, Civil Service (Medical): No    Lack of Transportation (Non-Medical): No  Physical Activity: Inactive (03/14/2022)   Exercise Vital Sign    Days of Exercise per Week: 0 days    Minutes of Exercise per Session: 0 min  Stress: No Stress Concern Present (03/14/2022)   [employer] of Occupational Health - Occupational Stress Questionnaire    Feeling of Stress : Not at all  Social Connections: Moderately Integrated (03/14/2022)   Social Connection and Isolation Panel [NHANES]    Frequency of Communication with Friends and Family: More than three times a week    Frequency of Social Gatherings with Friends and Family: More than three times a week    Attends Religious Services: More than 4 times per year    Active Member of [employer] or Organizations: No    Attends Banker Meetings: Never    Marital Status: Married     Family History: The patient's family history includes Heart attack in his father. There is no history of CVA, Cancer, Diabetes, or Stroke.  ROS:   Please see the history of present illness.    All other systems reviewed and are negative.  EKGs/Labs/Other Studies Reviewed:     The following studies were reviewed today: Echo 05/26/2022:  1. Left ventricular ejection fraction, by estimation, is 60 to 65%. The  left ventricle has normal function. The left ventricle has no regional  wall motion abnormalities. Left ventricular diastolic parameters are  consistent with Grade I diastolic  dysfunction (impaired relaxation).   2. Right ventricular systolic function is normal. The right ventricular  size is normal. There is normal pulmonary artery systolic pressure.   3. Left atrial size was mildly dilated.   4. Right atrial size was mildly  dilated.   5. The mitral valve is normal in structure. Trivial mitral valve  regurgitation. No evidence of mitral stenosis.   6. The aortic valve is tricuspid. There is mild calcification of the  aortic valve. Aortic valve regurgitation is mild. No aortic stenosis is  present. Aortic regurgitation PHT measures 1026 msec.   7. Aortic dilatation noted. There is mild dilatation of the ascending  aorta, measuring 43 mm.   8. The inferior vena cava is normal in size with greater than 50%  respiratory variability, suggesting right atrial pressure of 3 mmHg.       CTA Chest 05/26/2022: IMPRESSION: Relatively unchanged size/configuration of the ascending aorta, estimated 4.1 cm on the current CT angiogram. Recommend annual imaging followup by CTA or MRA. This recommendation follows 2010 ACCF/AHA/AATS/ACR/ASA/SCA/SCAI/SIR/STS/SVM Guidelines for the Diagnosis and Management of Patients with Thoracic Aortic Disease. Circulation. 2010; 121: G401-U272. Aortic aneurysm NOS (ICD10-I71.9)   Native coronary artery disease and aortic atherosclerosis. Aortic Atherosclerosis (ICD10-I70.0).  Recent Labs: 06/10/2022: ALT 19; BUN 18; Creatinine, Ser 0.81; Potassium 4.1; Sodium 143  Recent Lipid Panel    Component Value Date/Time   CHOL 196 06/10/2022 0908   TRIG 53 06/10/2022 0908   HDL 94 06/10/2022 0908   CHOLHDL 2.1 06/10/2022 0908    CHOLHDL 3 11/28/2018 1000   VLDL 20.2 11/28/2018 1000   LDLCALC 92 06/10/2022 0908     Risk Assessment/Calculations:      HYPERTENSION CONTROL Vitals:   12/15/22 0848 12/15/22 0938  BP: (!) 193/96 (!) 195/98    The patient's blood pressure is elevated above target today.  In order to address the patient's elevated BP: A new medication was prescribed today.; A current anti-hypertensive medication was adjusted today.; A referral to the PharmD Hypertension Clinic will be placed.            Physical Exam:    VS:  BP (!) 195/98   Pulse 87   Ht 6' (1.829 m)   Wt 250 lb (113.4 kg)   SpO2 95%   BMI 33.91 kg/m     Wt Readings from Last 3 Encounters:  12/15/22 250 lb (113.4 kg)  06/10/22 247 lb 3.2 oz (112.1 kg)  03/14/22 251 lb (113.9 kg)     GEN:  Well nourished, well developed pleasant elderly male in no acute distress HEENT: Normal NECK: No JVD; No carotid bruits LYMPHATICS: No lymphadenopathy CARDIAC: RRR, no murmurs, rubs, gallops RESPIRATORY:  Clear to auscultation without rales, wheezing or rhonchi  ABDOMEN: Soft, non-tender, non-distended MUSCULOSKELETAL:  No edema; No deformity  SKIN: Warm and dry NEUROLOGIC:  Alert and oriented x 3 PSYCHIATRIC:  Normal affect   ASSESSMENT:    1. Coronary artery disease involving native coronary artery of native heart with angina pectoris (HCC)   2. Mixed hyperlipidemia   3. Essential hypertension   4. Nonrheumatic aortic valve insufficiency   5. Aneurysm of ascending aorta without rupture (HCC)    PLAN:    In order of problems listed above:  Stable on current medical regimen which includes isosorbide, metoprolol, and amlodipine for antianginal therapy.  He remains on aspirin and a high intensity statin drug.  See below for medication changes. Treated with atorvastatin and ezetimibe.  Continue current management.  Goal LDL cholesterol less than 70 mg/dL. Blood pressure is uncontrolled.  States that he has been off of  amlodipine for greater than 1 month.  Recommend that he stay on isosorbide and metoprolol at his current doses, restart amlodipine 5 mg  daily, and add losartan 50 mg daily.  Advised him to stop at the pharmacy on the way home to pick up his prescriptions and start back on his antihypertensive medicines.  Discussed the importance of medication adherence.  Arrange Pharm.D. clinic hypertension follow-up in 2 weeks.  The patient has no symptoms of hypertensive urgency. Mild, recent echo reviewed, clinical follow-up appropriate. Mild aortic dilatation noted.  Recommend clinical follow-up at his advanced age.  Depending on his health, could consider reimaging in 2 years.     Medication Adjustments/Labs and Tests Ordered: Current medicines are reviewed at length with the patient today.  Concerns regarding medicines are outlined above.  Orders Placed This Encounter  Procedures   AMB Referral to [facility] Pharm[facility]   Meds ordered this encounter  Medications   amLODipine (NORVASC) 5 MG tablet    Sig: Take 1 tablet (5 mg total) by mouth daily.    Dispense:  90 tablet    Refill:  3   ezetimibe (ZETIA) 10 MG tablet    Sig: Take 1 tablet (10 mg total) by mouth daily.    Dispense:  90 tablet    Refill:  3   isosorbide mononitrate (IMDUR) 30 MG 24 hr tablet    Sig: Take 1 tablet (30 mg total) by mouth daily.    Dispense:  90 tablet    Refill:  3   losartan (COZAAR) 50 MG tablet    Sig: Take 1 tablet (50 mg total) by mouth daily.    Dispense:  90 tablet    Refill:  3    Patient Instructions  Medication Instructions:  CONTINUE Amlodipine 5mg  daily START Losartan 50mg  daily *If you need a refill on your cardiac medications before your next appointment, please call your pharmacy*   Lab Work: Lipids, LFT today (order already in) If you have labs (blood work) drawn today and your tests are completely normal, you will receive your results only by: MyChart Message (if you have MyChart) OR A paper  copy in the mail If you have any lab test that is abnormal or we need to change your treatment, we will call you to review the results.   Testing/Procedures: PharmD HTN clinic in 2 weeks   Follow-Up: At [facility], you and your health needs are our priority.  As part of our continuing mission to provide you with exceptional heart care, we have created designated Provider Care Teams.  These Care Teams include your primary Cardiologist (physician) and Advanced Practice Providers (APPs -  Physician Assistants and Nurse Practitioners) who all work together to provide you with the care you need, when you need it.  We recommend signing up for the patient portal called "MyChart".  Sign up information is provided on this After Visit Summary.  MyChart is used to connect with patients for Virtual Visits (Telemedicine).  Patients are able to view lab/test results, encounter notes, upcoming appointments, etc.  Non-urgent messages can be sent to your provider as well.   To learn more about what you can do with MyChart, go to [email or web address].    Your next appointment:   6 month(s)  Provider:   Tonny Bollman, MD        Signed, Tonny Bollman, MD  12/15/2022 1:04 PM    [facility]

## 2022-12-15 NOTE — Patient Instructions (Signed)
Medication Instructions:  CONTINUE Amlodipine 5mg  daily START Losartan 50mg  daily *If you need a refill on your cardiac medications before your next appointment, please call your pharmacy*   Lab Work: Lipids, LFT today (order already in) If you have labs (blood work) drawn today and your tests are completely normal, you will receive your results only by: MyChart Message (if you have MyChart) OR A paper copy in the mail If you have any lab test that is abnormal or we need to change your treatment, we will call you to review the results.   Testing/Procedures: PharmD HTN clinic in 2 weeks   Follow-Up: At [facility], you and your health needs are our priority.  As part of our continuing mission to provide you with exceptional heart care, we have created designated Provider Care Teams.  These Care Teams include your primary Cardiologist (physician) and Advanced Practice Providers (APPs -  Physician Assistants and Nurse Practitioners) who all work together to provide you with the care you need, when you need it.  We recommend signing up for the patient portal called "MyChart".  Sign up information is provided on this After Visit Summary.  MyChart is used to connect with patients for Virtual Visits (Telemedicine).  Patients are able to view lab/test results, encounter notes, upcoming appointments, etc.  Non-urgent messages can be sent to your provider as well.   To learn more about what you can do with MyChart, go to [email or web address].    Your next appointment:   6 month(s)  Provider:   Tonny Bollman, MD

## 2022-12-16 LAB — HEPATIC FUNCTION PANEL
ALT: 13 IU/L (ref 0–44)
AST: 18 IU/L (ref 0–40)
Albumin: 4.5 g/dL (ref 3.7–4.7)
Alkaline Phosphatase: 93 IU/L (ref 44–121)
Bilirubin Total: 0.5 mg/dL (ref 0.0–1.2)
Bilirubin, Direct: 0.19 mg/dL (ref 0.00–0.40)
Total Protein: 7 g/dL (ref 6.0–8.5)

## 2022-12-16 LAB — LIPID PANEL
Chol/HDL Ratio: 2.1 ratio (ref 0.0–5.0)
Cholesterol, Total: 191 mg/dL (ref 100–199)
HDL: 93 mg/dL (ref >39)
LDL Chol Calc (NIH): 83 mg/dL (ref 0–99)
Triglycerides: 86 mg/dL (ref 0–149)
VLDL Cholesterol Cal: 15 mg/dL (ref 5–40)

## 2022-12-20 ENCOUNTER — Telehealth: Payer: Self-pay | Admitting: Physician Assistant

## 2022-12-20 DIAGNOSIS — E782 Mixed hyperlipidemia: Secondary | ICD-10-CM

## 2022-12-20 NOTE — Telephone Encounter (Signed)
° °  Pt is returning call to get lab result °

## 2022-12-20 NOTE — Telephone Encounter (Signed)
-----   Message from [place] sent at 12/16/2022 12:21 PM EDT ----- LFTs normal. LDL above goal. He is on max dose Rosuvastatin, Ezetimibe.  PLAN:  -Refer to Lipid clinic to consider PCSK9 inhib (Repatha or Praluent) or siRNA Wilber Bihari) Tereso Newcomer, PA-C    12/16/2022 12:19 PM

## 2022-12-20 NOTE — Telephone Encounter (Signed)
Patient notified.  He is seeing pharmacist on 9/3 for hypertension.  Lipids can also be discussed at this appointment

## 2022-12-27 ENCOUNTER — Ambulatory Visit: Payer: Medicare Other | Attending: Internal Medicine | Admitting: Pharmacist

## 2022-12-27 DIAGNOSIS — Z79899 Other long term (current) drug therapy: Secondary | ICD-10-CM

## 2022-12-27 DIAGNOSIS — I10 Essential (primary) hypertension: Secondary | ICD-10-CM | POA: Diagnosis not present

## 2022-12-27 DIAGNOSIS — E782 Mixed hyperlipidemia: Secondary | ICD-10-CM | POA: Diagnosis not present

## 2022-12-27 MED ORDER — METOPROLOL TARTRATE 50 MG PO TABS: 50.0000 mg | ORAL_TABLET | Freq: Two times a day (BID) | ORAL | 3 refills | Status: DC

## 2022-12-27 NOTE — Assessment & Plan Note (Signed)
Assessment: LDL-C above goal of <70 Last labs were done when he was not taking rosuvastatin and ezetimibe consistently  Plan: Continue rosuvastatin 40mg  and ezetimibe 10mg  daily Recheck labs in 6 week 10/15 If still above goal, can discuss [id number]

## 2022-12-27 NOTE — Assessment & Plan Note (Signed)
Assessment: Blood pressure well controlled in office Patient reports higher BP at home but is not resting and checking before meds He has an OMRON BP cuff at home. Reports proper placement of cuff  Plan: Continue amlodipine 5 mg daily, Imdur 30 mg daily, losartan 50 mg daily, metoprolol tartrate 50 mg twice  I have asked him to rest 5 min before checking Check at least 2 hr after meds most of the time I will call him in 1-2 weeks to f/u on BP

## 2022-12-27 NOTE — Patient Instructions (Addendum)
Your blood pressure goal is < 130/32mmHg   Continue amlodipine 5 mg daily, Imdur 30 mg daily, losartan 50 mg daily, metoprolol tartrate 50 mg twice a day  Make sure you rest 5 min before checking blood pressure I will call you in 1-2 weeks to see how your blood pressure is doing Please call me at [phone] with any questions  Important lifestyle changes to control high blood pressure  Intervention  Effect on the BP   Weight loss Weight loss is one of the most effective lifestyle changes for controlling blood pressure. If you're overweight or obese, losing even a small amount of weight can help reduce blood pressure.    Blood pressure can decrease by 1 millimeter of mercury (mmHg) with each kilogram (about 2.2 pounds) of weight lost.   Exercise regularly As a general goal, aim for 30 minutes of moderate physical activity every day.    Regular physical activity can lower blood pressure by 5 - 8 mmHg.   Eat a healthy diet Eat a diet rich in whole grains, fruits, vegetables, lean meat, and low-fat dairy products. Limit processed foods, saturated fat, and sweets.    A heart-healthy diet can lower high blood pressure by 10 mmHg.   Reduce salt (sodium) in your diet Aim for 000mg  of sodium each day. Avoid deli meats, canned food, and frozen microwave meals which are high in sodium.     Limiting sodium can reduce blood pressure by 5 mmHg.   Limit alcohol One drink equals 12 ounces of beer, 5 ounces of wine, or 1.5 ounces of 80-proof liquor.    Limiting alcohol to < 1 drink a day for women or < 2 drinks a day for men can help lower blood pressure by about 4 mmHg.   To check your pressure at home you will need to:   Sit up in a chair, with feet flat on the floor and back supported. Do not cross your ankles or legs. Rest your left arm so that the cuff is about heart level. If the cuff goes on your upper arm, then just relax your arm on the table, arm of the chair, or your lap. If  you have a wrist cuff, hold your wrist against your chest at heart level. Place the cuff snugly around your arm, about 1 inch above the crease of your elbow. The cords should be inside the groove of your elbow.  Sit quietly, with the cuff in place, for about 5 minutes. Then press the power button to start a reading. Do not talk or move while the reading is taking place.  Record your readings on a sheet of paper. Although most cuffs have a memory, it is often easier to see a pattern developing when the numbers are all in front of you.  You can repeat the reading after 1-3 minutes if it is recommended.   Make sure your bladder is empty and you have not had caffeine or tobacco within the last 30 minutes   Always bring your blood pressure log with you to your appointments. If you have not brought your monitor in to be double checked for accuracy, please bring it to your next appointment.   You can find a list of validated (accurate) blood pressure cuffs at: validatebp.org

## 2022-12-27 NOTE — Progress Notes (Signed)
Patient ID: Wyatt Mason                 DOB: Jun 04, 1932                    MRN: [id number]      HPI: Wyatt Mason is a 89 y.o. male patient referred to lipid clinic by Dr. Excell Seltzer. PMH is significant for CAD status post DES in 2014, aortic insufficiency, thoracic aortic aneurysm, hypertension, hyperlipidemia.  Patient was seen by Dr. Excell Seltzer on 12/15/2022.  Blood pressure was significantly elevated.  Patient reports having a difficult time with his wife's dementia.  He had not been taking any of his antihypertensives.  He was resumed on amlodipine 5 mg daily and losartan 50 mg daily.  Lipid panel also showed an LDL cholesterol of 83 and he was referred to Pharm.D. clinic to discuss blood pressure and cholesterol.   Patient presents to PharmD clinic today. He reports that he restarted his medications about a week ago. Today at home his BP was 147/67. Checking in the AM, before meds, not resting long. Denies any dizziness, SOB or swelling. Tries to walk some. Limited by knee pain (bakers cyst). Is the caregiver for his wife with dementia, struggles with this. Became remiss about his medications but is back to taking them. Reports was not taking cholesterol meds consistently at time of labs.  Current Lipid Medications: rosuvastatin 40mg  daily, ezetimibe 10 mg daily Intolerances:  Risk Factors: Non-STEMI 2014, thoracic aortic aneurysm, hypertension LDL-C goal: <70 ApoB goal:   Current HTN Medications: amlodipine 5 mg daily, Imdur 30 mg daily, losartan 50 mg daily, metoprolol tartrate 50 mg twice  Previously tried: BP goal: <130/80  Exercise: tries to walk a little bit  Labs: Lipid Panel     Component Value Date/Time   CHOL 191 12/15/2022 0939   TRIG 86 12/15/2022 0939   HDL 93 12/15/2022 0939   CHOLHDL 2.1 12/15/2022 0939   CHOLHDL 3 11/28/2018 1000   VLDL 20.2 11/28/2018 1000   LDLCALC 83 12/15/2022 0939   LABVLDL 15 12/15/2022 0939    Past Medical History:  Diagnosis Date    Aortic insufficiency 06/11/2021   Echo 2/23: EF 60-65, no RWMA, mild LVH, GR 1 DD, GLS -23.2, normal RVSF, normal PASP, mild-moderate AI, AV sclerosis without stenosis, ascending aortic aneurysm 4.3 cm   CAD (coronary artery disease) 04/15/2013   a. s/p DES x 2 to mid OM, residual RCA and LAD borderline CAD. b. Nuc 12/2014 - low risk.   Hyperglycemia    Hyperlipidemia    Hypertension    Obesity    Right bundle branch block 06/08/2021   Shingles 2005   Thoracic ascending aortic aneurysm (HCC) 06/11/2021   Echo 2/23:  ascending aortic aneurysm 4.3 cm    Current Outpatient Medications on File Prior to Visit  Medication Sig Dispense Refill   amLODipine (NORVASC) 5 MG tablet Take 1 tablet (5 mg total) by mouth daily. 90 tablet 3   amoxicillin (AMOXIL) 500 MG capsule Take 500 mg by mouth as needed (for Dental appointments).     aspirin EC 81 MG tablet Take 81 mg by mouth daily.     Cholecalciferol (VITAMIN D-3) 1000 UNITS CAPS Take 1 capsule by mouth daily.      ezetimibe (ZETIA) 10 MG tablet Take 1 tablet (10 mg total) by mouth daily. 90 tablet 3   isosorbide mononitrate (IMDUR) 30 MG 24 hr tablet Take 1 tablet (30 mg  total) by mouth daily. 90 tablet 3   losartan (COZAAR) 50 MG tablet Take 1 tablet (50 mg total) by mouth daily. 90 tablet 3   Multiple Vitamin (MULTIVITAMIN WITH MINERALS) TABS tablet Take 1 tablet by mouth daily.     nitroGLYCERIN (NITROSTAT) 0.4 MG SL tablet place 1 tablet under the tongue if needed every 5 minutes for 3 doses if needed for chest pain 25 tablet 3   rosuvastatin (CRESTOR) 40 MG tablet Take 1 tablet (40 mg total) by mouth daily. 90 tablet 3   No current facility-administered medications on file prior to visit.    Allergies  Allergen Reactions   Other     Environmental allergies    Assessment/Plan:  1. Hyperlipidemia -  Essential hypertension Assessment: Blood pressure well controlled in office Patient reports higher BP at home but is not resting and  checking before meds He has an OMRON BP cuff at home. Reports proper placement of cuff  Plan: Continue amlodipine 5 mg daily, Imdur 30 mg daily, losartan 50 mg daily, metoprolol tartrate 50 mg twice  I have asked him to rest 5 min before checking Check at least 2 hr after meds most of the time I will call him in 1-2 weeks to f/u on BP   HYPERLIPIDEMIA Assessment: LDL-C above goal of <70 Last labs were done when he was not taking rosuvastatin and ezetimibe consistently  Plan: Continue rosuvastatin 40mg  and ezetimibe 10mg  daily Recheck labs in 6 week 10/15 If still above goal, can discuss PCKS9i    Thank you,  Olene Floss, Pharm.D, BCACP, BCPS, CPP [facility] of [facility] [address] [place], [place] [address]  Phone: [phone]; Fax: [phone]

## 2022-12-28 LAB — BASIC METABOLIC PANEL
BUN/Creatinine Ratio: 18 (ref 10–24)
BUN: 15 mg/dL (ref 8–27)
CO2: 24 mmol/L (ref 20–29)
Calcium: 9 mg/dL (ref 8.6–10.2)
Chloride: 100 mmol/L (ref 96–106)
Creatinine, Ser: 0.83 mg/dL (ref 0.76–1.27)
Glucose: 102 mg/dL — ABNORMAL HIGH (ref 70–99)
Potassium: 4.8 mmol/L (ref 3.5–5.2)
Sodium: 141 mmol/L (ref 134–144)
eGFR: 84 mL/min/{1.73_m2} (ref >59)

## 2023-01-02 ENCOUNTER — Other Ambulatory Visit: Payer: Medicare Other

## 2023-01-09 ENCOUNTER — Telehealth: Payer: Self-pay | Admitting: Pharmacist

## 2023-01-09 NOTE — Telephone Encounter (Signed)
Called pt to see how his BP was doing. Reports 135/72, 137/72 and 1 reading in the 150's systolic.  He is taking his medications Continue same therapy for now. Will f/u in Oct

## 2023-01-27 DIAGNOSIS — Z96641 Presence of right artificial hip joint: Secondary | ICD-10-CM | POA: Diagnosis not present

## 2023-01-27 DIAGNOSIS — M7061 Trochanteric bursitis, right hip: Secondary | ICD-10-CM | POA: Diagnosis not present

## 2023-02-07 ENCOUNTER — Ambulatory Visit: Payer: Medicare Other | Attending: Physician Assistant

## 2023-02-07 DIAGNOSIS — E782 Mixed hyperlipidemia: Secondary | ICD-10-CM | POA: Diagnosis not present

## 2023-02-08 LAB — LIPID PANEL
Chol/HDL Ratio: 1.8 {ratio} (ref 0.0–5.0)
Cholesterol, Total: 198 mg/dL (ref 100–199)
HDL: 111 mg/dL (ref >39)
LDL Chol Calc (NIH): 77 mg/dL (ref 0–99)
Triglycerides: 54 mg/dL (ref 0–149)
VLDL Cholesterol Cal: 10 mg/dL (ref 5–40)

## 2023-02-09 ENCOUNTER — Telehealth: Payer: Self-pay | Admitting: Pharmacist

## 2023-02-09 NOTE — Telephone Encounter (Signed)
Reviewed lipid labs with patient. LDL-C still a little above goal of <70 (77). Non-HDL at goal. Reports taking rosuvastatin and zetia. No interested in another medication which at his age I think is reasonable.  His BP still fluctuates in the 130's to sometimes 140's/70's. I feel think probably has a lot to do with stress of taking care of wife with dementia and improper technique at home. Continue current medications.

## 2023-05-03 DIAGNOSIS — H04221 Epiphora due to insufficient drainage, right lacrimal gland: Secondary | ICD-10-CM | POA: Diagnosis not present

## 2023-05-03 DIAGNOSIS — H02403 Unspecified ptosis of bilateral eyelids: Secondary | ICD-10-CM | POA: Diagnosis not present

## 2023-05-03 DIAGNOSIS — H11441 Conjunctival cysts, right eye: Secondary | ICD-10-CM | POA: Diagnosis not present

## 2023-05-03 DIAGNOSIS — Z961 Presence of intraocular lens: Secondary | ICD-10-CM | POA: Diagnosis not present

## 2023-06-28 ENCOUNTER — Ambulatory Visit: Admitting: Cardiovascular Disease

## 2023-07-11 NOTE — Progress Notes (Unsigned)
 Cardiology Office Note:  .   Date:  07/13/2023  ID:  [name], DOB November 14, 1932, MRN [id number] PCP: Ardith Dark, MD  [facility] Providers Cardiologist:  Tonny Bollman, MD    Patient Profile: .      PMH Coronary artery disease  NSTEMI s/p DES x 2 to OM in 2014 Myoview 01/12/2015: No ischemia, low risk, EF 55-65 Aortic insufficiency TTE 05/26/2022: EF 60-65, no RWMA, GR 1 DD, normal RVSF, normal PASP, mild BAE, trivial MR, mild AI, ascending aorta 43 mm, RAP 3 Thoracic aortic aneurysm Chest CTA 05/26/2022: Ascending thoracic aorta 4.1 cm Hypertension  Hyperlipidemia  Hx of lumbar compression fx S/p L knee surgery  Right Bundle Branch Block  Chronic lower extremity edema  History of non-STEMI in 2014 treated with DES x 2 to the OM.  He had moderate nonobstructive disease elsewhere at that time.  Stress test in 2016 was low risk.  He has chronic lower extremity edema.  Echocardiogram completed 2023 for new right bundle branch demonstrated normal LV function.  He was seen by Dr. Excell Seltzer 12/15/2022 at which time he reported having a difficult time with his wife's progressive dementia.  BP was significantly elevated at 193-96 and 195/98.  He reported inconsistency with taking his antihypertensive medications.  He had been off amlodipine for 1 month.  He was advised to start amlodipine 5 mg daily and add losartan 50 mg daily.  Follow-up with Pharm.D. in hypertension clinic in 2 weeks.  LDL was 80, above goal of less than 70.  He was advised to discuss additional therapy at [facility].D. visit.  Seen by Malena Peer, RPH on 12/27/2022.  He reported compliance with antihypertensive medications for about 1 week.  BP at home was 147/67.  BP in clinic was 130/68.  He was advised to continue amlodipine 5 mg daily, Imdur 30 mg daily, losartan 50 mg daily, and metoprolol tartrate 50 mg twice daily.  He reported noncompliance with rosuvastatin and ezetimibe.  Was advised to resume daily dosing  of ezetimibe 10 mg daily and rosuvastatin 40 mg daily.  Have repeat lipid testing in 6 weeks.    Lipid panel on 02/07/2023 revealed total cholesterol 198, triglycerides 54, HDL 111, and LDL-C 77. Per phone call with Pharm D, he was not interested in adding an additional lipid-lowering medication. Per his report BP was ranging in the 130s to sometimes 140s over 70s. He admitted to a lot of life stress.  No medication changes were made.       History of Present Illness: .   KYRE JEFFRIES is a very pleasant 89 y.o. male who is here today for follow-up of CAD. He reports he is doing well, but is not sleeping well related to caring for his wife. Has bilateral leg swelling for several months, with the left leg more swollen than the right.  He denies chest pain, shortness of breath, orthopnea, PND.  He does not monitor his weight consistently.  Home BP ranges from 130-145/60-70 mmHg. He takes amlodipine, losartan, and Imdur in the morning and metoprolol twice daily. He occasionally feels lightheaded, especially when getting up from laying down.  No presyncope or syncope. He continues to care for his wife at home.  Says his daughter is helpful at times and he can leave his wife for a few hours at the time.  He is having difficulty walking due to Baker's cyst behind both knees.  Occasionally stumbles and has to hold onto things to  help him. He uses a walker if getting up at night but not at other times. No falls. Has seen Dr. Despina Hick for the cysts; consideration given to draining them but was told they would return.   Discussed the use of AI scribe software for clinical note transcription with the patient, who gave verbal consent to proceed.   ROS: See HPI       Studies Reviewed: Marland Kitchen   EKG Interpretation Date/Time:  Thursday July 13 2023 08:13:15 EDT Ventricular Rate:  68 PR Interval:  182 QRS Duration:  152 QT Interval:  446 QTC Calculation: 474 R Axis:   14  Text Interpretation: Normal sinus rhythm  Right bundle branch block No acute changes Confirmed by Eligha Bridegroom [phone]) on 07/13/2023 8:18:55 AM     No results found for: "LIPOA"   Risk Assessment/Calculations:     HYPERTENSION CONTROL Vitals:   07/13/23 0808 07/13/23 0847  BP: (!) 170/80 (!) 150/82    The patient's blood pressure is elevated above target today.  In order to address the patient's elevated BP: Blood pressure will be monitored at home to determine if medication changes need to be made.          Physical Exam:   VS:  BP (!) 150/82   Pulse 68   Ht 6' (1.829 m)   Wt 253 lb 6.4 oz (114.9 kg)   SpO2 93%   BMI 34.37 kg/m    Wt Readings from Last 3 Encounters:  07/13/23 253 lb 6.4 oz (114.9 kg)  12/15/22 250 lb (113.4 kg)  06/10/22 247 lb 3.2 oz (112.1 kg)    GEN: Obese, well developed in no acute distress NECK: No JVD; No carotid bruits CARDIAC: RRR, soft murmur. No rubs, gallops RESPIRATORY:  Clear to auscultation without rales, wheezing or rhonchi  ABDOMEN: Soft, non-tender, non-distended EXTREMITIES:  No edema; No deformity     ASSESSMENT AND PLAN: .    CAD: History of DES x 2 to OM in 2014. Low risk myoview 2016. He is active at home with house work and grocery shopping. He denies chest pain, dyspnea, or other symptoms concerning for angina.  No indication for further ischemic evaluation at this time.  No bleeding concerns.  Continue aspirin, Imdur, metoprolol, Crestor, ezetimibe, amlodipine, losartan.  Hyperlipidemia LDL goal < 70: Lipid panel completed 02/07/2023 with total cholesterol 198, triglycerides 54, HDL 111, and LDL 77. He came fasting today so we will go ahead and recheck lipids.  He reports compliance with ezetimibe and rosuvastatin. No changes in lipid therapy today.   Right BBB: Known. Normal LVEF on echo 05/2022.  No change in symptoms concerning for worsening heart function.  We will continue to monitor clinically for now.  Hypertension: BP is elevated and remains elevated on my  recheck.  He has not taking his antihypertensive medications yet today.  Home BP mostly 140s. Advised him goal is < 140/80. Have asked him to monitor and call if SBP consistently > 140 mmHg.  Encouraged him to change amlodipine dosing to evening for better 24-hour BP control.  Aortic insufficiency: Mild aortic valve insufficiency, normal LV on TTE 05/26/2022.  He is asymptomatic.  We will continue to monitor clinically for now.  Ascending aortic dilatation: Mild dilatation of the ascending aorta measuring 43 mm on TTE 05/26/2022.  CTA chest aorta 05/26/2022 revealed greatest diameter of ascending aorta 41 mm, mild atherosclerotic changes of the aorta.  Recommendation per Dr. Excell Seltzer, primary cardiologist, to repeat in 2 years  depending upon patient's clinical condition.       Disposition:6 months with Dr. Excell Seltzer or APP  Signed, Eligha Bridegroom, NP-C

## 2023-07-13 ENCOUNTER — Ambulatory Visit: Attending: Nurse Practitioner | Admitting: Nurse Practitioner

## 2023-07-13 ENCOUNTER — Encounter: Payer: Self-pay | Admitting: Nurse Practitioner

## 2023-07-13 VITALS — BP 150/82 | HR 68 | Ht 72.0 in | Wt 253.4 lb

## 2023-07-13 DIAGNOSIS — I25119 Atherosclerotic heart disease of native coronary artery with unspecified angina pectoris: Secondary | ICD-10-CM | POA: Diagnosis not present

## 2023-07-13 DIAGNOSIS — I10 Essential (primary) hypertension: Secondary | ICD-10-CM | POA: Diagnosis not present

## 2023-07-13 DIAGNOSIS — I7121 Aneurysm of the ascending aorta, without rupture: Secondary | ICD-10-CM

## 2023-07-13 DIAGNOSIS — I4510 Unspecified right bundle-branch block: Secondary | ICD-10-CM | POA: Diagnosis not present

## 2023-07-13 DIAGNOSIS — I2510 Atherosclerotic heart disease of native coronary artery without angina pectoris: Secondary | ICD-10-CM

## 2023-07-13 DIAGNOSIS — I351 Nonrheumatic aortic (valve) insufficiency: Secondary | ICD-10-CM | POA: Diagnosis not present

## 2023-07-13 DIAGNOSIS — E782 Mixed hyperlipidemia: Secondary | ICD-10-CM

## 2023-07-13 LAB — COMPREHENSIVE METABOLIC PANEL
ALT: 13 IU/L (ref 0–44)
AST: 16 IU/L (ref 0–40)
Albumin: 4.3 g/dL (ref 3.7–4.7)
Alkaline Phosphatase: 82 IU/L (ref 44–121)
BUN/Creatinine Ratio: 20 (ref 10–24)
BUN: 15 mg/dL (ref 8–27)
Bilirubin Total: 0.8 mg/dL (ref 0.0–1.2)
CO2: 28 mmol/L (ref 20–29)
Calcium: 9.1 mg/dL (ref 8.6–10.2)
Chloride: 101 mmol/L (ref 96–106)
Creatinine, Ser: 0.74 mg/dL — ABNORMAL LOW (ref 0.76–1.27)
Globulin, Total: 2.3 g/dL (ref 1.5–4.5)
Glucose: 103 mg/dL — ABNORMAL HIGH (ref 70–99)
Potassium: 4.4 mmol/L (ref 3.5–5.2)
Sodium: 141 mmol/L (ref 134–144)
Total Protein: 6.6 g/dL (ref 6.0–8.5)
eGFR: 78 mL/min/{1.73_m2} (ref >59)

## 2023-07-13 LAB — LIPID PANEL
Chol/HDL Ratio: 2.4 ratio (ref 0.0–5.0)
Cholesterol, Total: 203 mg/dL — ABNORMAL HIGH (ref 100–199)
HDL: 84 mg/dL (ref >39)
LDL Chol Calc (NIH): 101 mg/dL — ABNORMAL HIGH (ref 0–99)
Triglycerides: 102 mg/dL (ref 0–149)
VLDL Cholesterol Cal: 18 mg/dL (ref 5–40)

## 2023-07-13 MED ORDER — AMLODIPINE BESYLATE 5 MG PO TABS: 5.0000 mg | ORAL_TABLET | Freq: Every evening | ORAL | Status: AC

## 2023-07-13 NOTE — Patient Instructions (Signed)
 Medication Instructions:   CHANGE Amlodipine one (1) tablet by mouth ( 5 mg) nightly.   *If you need a refill on your cardiac medications before your next appointment, please call your pharmacy*   Lab Work:  TODAY!! CMET/LIPID  If you have labs (blood work) drawn today and your tests are completely normal, you will receive your results only by: MyChart Message (if you have MyChart) OR A paper copy in the mail If you have any lab test that is abnormal or we need to change your treatment, we will call you to review the results.   Testing/Procedures:  None ordered.   Follow-Up: At [facility], you and your health needs are our priority.  As part of our continuing mission to provide you with exceptional heart care, we have created designated Provider Care Teams.  These Care Teams include your primary Cardiologist (physician) and Advanced Practice Providers (APPs -  Physician Assistants and Nurse Practitioners) who all work together to provide you with the care you need, when you need it.  We recommend signing up for the patient portal called "MyChart".  Sign up information is provided on this After Visit Summary.  MyChart is used to connect with patients for Virtual Visits (Telemedicine).  Patients are able to view lab/test results, encounter notes, upcoming appointments, etc.  Non-urgent messages can be sent to your provider as well.   To learn more about what you can do with MyChart, go to [email or web address].    Your next appointment:   6 month(s)  Provider:   Tonny Bollman, MD     Other Instructions  Your physician wants you to follow-up in: 6 months.  You will receive a reminder letter in the mail two months in advance. If you don't receive a letter, please call our office to schedule the follow-up appointment.  HOW TO TAKE YOUR BLOOD PRESSURE  Rest 5 minutes before taking your blood pressure. Don't  smoke or drink caffeinated beverages for at least 30 minutes  before. Take your blood pressure before (not after) you eat. Sit comfortably with your back supported and both feet on the floor ( don't cross your legs). Elevate your arm to heart level on a table or a desk. Use the proper sized cuff.  It should fit smoothly and snugly around your bare upper arm.  There should be  Enough room to slip a fingertip under the cuff.  The bottom edge of the cuff should be 1 inch above the crease Of the elbow. Please monitor your blood pressure once daily 2 hours after your am medication. If you blood pressure Consistently remains above 140 (systolic) top number or over 80 ( diastolic) bottom number X 3 days  Consecutively.  Please call our office at [phone] or send Mychart message.     ----Avoid cold medicines with D or DM at the end of them----    1st Floor: - Lobby - Registration  - Pharmacy  - Lab - Cafe  2nd Floor: - PV Lab - Diagnostic Testing (echo, CT, nuclear med)  3rd Floor: - Vacant  4th Floor: - TCTS (cardiothoracic surgery) - AFib Clinic - Structural Heart Clinic - Vascular Surgery  - Vascular Ultrasound  5th Floor: - HeartCare Cardiology (general and EP) - Clinical Pharmacy for coumadin, hypertension, lipid, weight-loss medications, and med management appointments    Valet parking services will be available as well.

## 2023-07-13 NOTE — Progress Notes (Signed)
 Has not taken anti-hypertensive medications today

## 2023-08-08 ENCOUNTER — Telehealth (HOSPITAL_BASED_OUTPATIENT_CLINIC_OR_DEPARTMENT_OTHER): Payer: Self-pay

## 2023-08-08 ENCOUNTER — Telehealth: Payer: Self-pay | Admitting: Pharmacy Technician

## 2023-08-08 ENCOUNTER — Other Ambulatory Visit (HOSPITAL_COMMUNITY): Payer: Self-pay

## 2023-08-08 NOTE — Telephone Encounter (Addendum)
 2nd call attempt to pt, results reviewed with patient, he is open to checking into costs of medications. Advised will send note over to PA team and get back with him on costs. Patient verbalizes understanding.    ----- Message from Gerldine Koch sent at 07/14/2023  1:00 PM EDT ----- Kidney function and liver function are stable. LDL cholesterol is above goal of < 70. Could we see if the cost of Repatha or Praluent is more affordable now? Alternatively, there is an oral medication called Nexletol that we could try. Each of these works differently than rosuvastatin and ezetimibe.

## 2023-08-08 NOTE — Telephone Encounter (Signed)
 Pharmacy Patient Advocate Encounter   Received notification from Pt Calls Messages that prior authorization for Nexletol is required/requested.   Insurance verification completed.   The patient is insured through [employer] .   Per test claim: PA required; PA submitted to above mentioned insurance via CoverMyMeds Key/confirmation #/EOC [id number] Status is pending

## 2023-08-08 NOTE — Telephone Encounter (Signed)
 Pharmacy Patient Advocate Encounter  Received notification from [facility] that Prior Authorization for Repatha has been APPROVED from 08/08/23 to 02/07/24. Ran test claim, Copay is $302.00- one month. This test claim was processed through [facility]- copay amounts may vary at other pharmacies due to pharmacy/plan contracts, or as the patient moves through the different stages of their insurance plan.   PA #/Case ID/Reference #: [id number]

## 2023-08-08 NOTE — Telephone Encounter (Signed)
 Per Pa team, repatha preferred over praulent. Repatha and Nexletol both approved, but listed as a copay of 302/month for both. Will route to NP to make her aware.

## 2023-08-08 NOTE — Telephone Encounter (Signed)
 Pharmacy Patient Advocate Encounter  Received notification from [facility] that Prior Authorization for Nexletol has been APPROVED from 08/08/23 to 02/07/24. Ran test claim, Copay is $302.00- one month. This test claim was processed through [facility]- copay amounts may vary at other pharmacies due to pharmacy/plan contracts, or as the patient moves through the different stages of their insurance plan.   PA #/Case ID/Reference #: [id number]

## 2023-08-08 NOTE — Telephone Encounter (Signed)
 Pharmacy Patient Advocate Encounter   Received notification from Pt Calls Messages that prior authorization for Repatha is required/requested.   Insurance verification completed.   The patient is insured through [employer] .   Per test claim: PA required; PA submitted to above mentioned insurance via CoverMyMeds Key/confirmation #/EOC [id number] Status is pending

## 2023-08-10 NOTE — Telephone Encounter (Signed)
 No vm will try pt later.

## 2023-08-10 NOTE — Telephone Encounter (Signed)
 Please follow-up with patient to determine if he wants to start Repatha or Nexletol. Last LDL was 101, goal if 70 or lower. If these are cost prohibitive, we will continue ezetimibe and rosuvastatin. Focus on secondary prevention including heart healthy mostly plant based diet avoiding saturated fat, processed foods, simple carbohydrates, and sugar along with aiming for at least 150 minutes of moderate intensity exercise each week.

## 2023-08-14 NOTE — Telephone Encounter (Signed)
 S/w pt is going to stay on his current medication regiment. Will send to [facility] to [place].

## 2023-09-29 DIAGNOSIS — M25551 Pain in right hip: Secondary | ICD-10-CM | POA: Diagnosis not present

## 2023-09-29 DIAGNOSIS — Z96641 Presence of right artificial hip joint: Secondary | ICD-10-CM | POA: Diagnosis not present

## 2023-09-29 DIAGNOSIS — M1711 Unilateral primary osteoarthritis, right knee: Secondary | ICD-10-CM | POA: Diagnosis not present

## 2023-12-11 ENCOUNTER — Other Ambulatory Visit: Payer: Self-pay | Admitting: Physician Assistant

## 2023-12-11 DIAGNOSIS — I10 Essential (primary) hypertension: Secondary | ICD-10-CM

## 2023-12-11 DIAGNOSIS — E782 Mixed hyperlipidemia: Secondary | ICD-10-CM

## 2023-12-11 DIAGNOSIS — Z79899 Other long term (current) drug therapy: Secondary | ICD-10-CM

## 2023-12-19 ENCOUNTER — Other Ambulatory Visit: Payer: Self-pay | Admitting: Cardiovascular Disease

## 2023-12-19 DIAGNOSIS — I10 Essential (primary) hypertension: Secondary | ICD-10-CM

## 2023-12-19 DIAGNOSIS — E782 Mixed hyperlipidemia: Secondary | ICD-10-CM

## 2023-12-19 DIAGNOSIS — I351 Nonrheumatic aortic (valve) insufficiency: Secondary | ICD-10-CM

## 2023-12-19 DIAGNOSIS — I7121 Aneurysm of the ascending aorta, without rupture: Secondary | ICD-10-CM

## 2023-12-19 DIAGNOSIS — I25119 Atherosclerotic heart disease of native coronary artery with unspecified angina pectoris: Secondary | ICD-10-CM

## 2023-12-26 ENCOUNTER — Encounter: Payer: Self-pay | Admitting: Pharmacist

## 2023-12-26 ENCOUNTER — Other Ambulatory Visit: Payer: Self-pay | Admitting: Cardiovascular Disease

## 2023-12-26 DIAGNOSIS — I7121 Aneurysm of the ascending aorta, without rupture: Secondary | ICD-10-CM

## 2023-12-26 DIAGNOSIS — I25119 Atherosclerotic heart disease of native coronary artery with unspecified angina pectoris: Secondary | ICD-10-CM

## 2023-12-26 DIAGNOSIS — I351 Nonrheumatic aortic (valve) insufficiency: Secondary | ICD-10-CM

## 2023-12-26 DIAGNOSIS — I10 Essential (primary) hypertension: Secondary | ICD-10-CM

## 2023-12-26 DIAGNOSIS — E782 Mixed hyperlipidemia: Secondary | ICD-10-CM

## 2023-12-26 NOTE — Progress Notes (Signed)
 Pharmacy Quality Measure Review  This patient is appearing on a report for being at risk of failing the adherence measure for hypertension (ACEi/ARB) medications this calendar year.   Medication: losartan  Last fill date: 07/26/2023 for 90 day supply It looks like has has 1 refill remaining but also that Wyatt Mason sent in a new Rx 12/20/2023  Patient reported compliance with rosuvastatin  and ezetimibe  at last cardio visit but refill history shows no recent rosuvastatin  fills and last Rx written for rosuvastatin  was in 05/2021 and would have expired 05/2022. Ezetimibe  was filled last 07/28/2023 for 90 day supply.  There was notation about starting Repatha or Nexletol - prior authorization was approved fro both but it does not look like either was started or is on his medication list. Looks like cardio was having difficulty reaching patient.   Spoke to Wyatt Mason today. He reports that [facility] told him losartan  Rx was denied by Wyatt Mason office. It looks like there are 2 requests for losartan  RF - one was approved by Wyatt Mason office and second was denied - approved by other means. Patient has appointment with Wyatt Mason in October 2025. I spoke with [facility] today and they did not get refill that was sent 12/20/2023 from Wyatt Mason for losartan  - they will request again. Patient states he has been taking rosuvastatin . He should need updated Rx - will see if Wyatt Mason office will [facility] Rx until appt in October 2025.  Sent message to scheduler to call patient to make follow up with Wyatt Mason for CPE.   Wyatt Mason, PharmD Clinical Pharmacist [facility]  Population Health [phone]

## 2024-01-09 ENCOUNTER — Other Ambulatory Visit (HOSPITAL_COMMUNITY): Payer: Self-pay

## 2024-01-09 ENCOUNTER — Telehealth: Payer: Self-pay | Admitting: Pharmacy Technician

## 2024-01-09 NOTE — Telephone Encounter (Signed)
 Pharmacy Patient Advocate Encounter   Received notification from CoverMyMeds that prior authorization for nexletol is required/requested.   Insurance verification completed.   The patient is insured through [employer] .   Per test claim: PA required; PA submitted to above mentioned insurance via Latent Key/confirmation #/EOC [facility] Status is pending

## 2024-01-09 NOTE — Telephone Encounter (Signed)
 Pharmacy Patient Advocate Encounter  Received notification from [facility] that Prior Authorization for nexletol has been APPROVED from 01/09/24 to 04/24/24   PA #/Case ID/Reference #: [id number]

## 2024-01-12 ENCOUNTER — Ambulatory Visit: Attending: Cardiovascular Disease | Admitting: Cardiovascular Disease

## 2024-01-12 ENCOUNTER — Encounter: Payer: Self-pay | Admitting: Cardiovascular Disease

## 2024-01-12 VITALS — BP 140/80 | HR 72 | Ht 72.0 in | Wt 240.4 lb

## 2024-01-12 DIAGNOSIS — I351 Nonrheumatic aortic (valve) insufficiency: Secondary | ICD-10-CM | POA: Diagnosis not present

## 2024-01-12 DIAGNOSIS — I7121 Aneurysm of the ascending aorta, without rupture: Secondary | ICD-10-CM

## 2024-01-12 DIAGNOSIS — I10 Essential (primary) hypertension: Secondary | ICD-10-CM

## 2024-01-12 DIAGNOSIS — I25119 Atherosclerotic heart disease of native coronary artery with unspecified angina pectoris: Secondary | ICD-10-CM

## 2024-01-12 DIAGNOSIS — E782 Mixed hyperlipidemia: Secondary | ICD-10-CM

## 2024-01-12 MED ORDER — ISOSORBIDE MONONITRATE ER 30 MG PO TB24: 30.0000 mg | ORAL_TABLET | Freq: Every day | ORAL | 3 refills | Status: DC

## 2024-01-12 MED ORDER — NITROGLYCERIN 0.4 MG SL SUBL: SUBLINGUAL_TABLET | SUBLINGUAL | 3 refills | Status: AC

## 2024-01-12 NOTE — Assessment & Plan Note (Signed)
 No anginal symptoms on amlodipine , isosorbide , and metoprolol .  He will continue on aspirin  81 mg daily.  Continue high intensity statin drug with rosuvastatin  40 mg daily.

## 2024-01-12 NOTE — Patient Instructions (Signed)

## 2024-01-12 NOTE — Assessment & Plan Note (Signed)
 Continue rosuvastatin  and ezetimibe .  LDL is above goal at 101.  Patient is not interested in lipid clinic referral for injectable therapies at age 89.  Continue current management.

## 2024-01-12 NOTE — Progress Notes (Signed)
 Cardiology Office Note:    Date:  01/12/2024   ID:  Wyatt Mason, DOB 03-04-1933, MRN [id number]  PCP:  Kennyth Worth HERO, MD   [facility] Providers Cardiologist:  Ozell Fell, MD     Referring MD: Kennyth Worth HERO, MD   Chief Complaint  Patient presents with   Coronary Artery Disease    History of Present Illness:    Wyatt Mason is a 89 y.o. male with a hx of:  Coronary artery disease  NSTEMI s/p DES x 2 to OM in 2014 Myoview 01/12/2015: No ischemia, low risk, EF 55-65 Aortic insufficiency TTE 05/26/2022: EF 60-65, no RWMA, GR 1 DD, normal RVSF, normal PASP, mild BAE, trivial MR, mild AI, ascending aorta 43 mm, RAP 3 Thoracic aortic aneurysm Chest CTA 05/26/2022: Ascending thoracic aorta 4.1 cm Hypertension  Hyperlipidemia  Hx of lumbar compression fx S/p L knee surgery  Right Bundle Branch Block   The patient is here alone today. He continues to have a lot of stress with his wife's dementia.  His weight is down 10 pounds from last year.  Patient is ambulating with a cane as he states his gait has become more unsteady.  He otherwise is doing well and denies any chest pain, chest pressure, or shortness of breath.  He is compliant with his medications.  States that his blood pressure has been much better this year with regular medication dosing.  When I saw him last year he was severely hypertensive and I got him back on his medicines, referred him to the Pharm.D. hypertension clinic, and he did very well with this.  Current Medications: Current Meds  Medication Sig   amLODipine  (NORVASC ) 5 MG tablet Take 1 tablet (5 mg total) by mouth at bedtime.   amoxicillin  (AMOXIL ) 500 MG capsule Take 500 mg by mouth as needed (for Dental appointments).   aspirin  EC 81 MG tablet Take 81 mg by mouth daily.   Cholecalciferol (VITAMIN D-3) 1000 UNITS CAPS Take 1 capsule by mouth daily.    ezetimibe  (ZETIA ) 10 MG tablet Take 1 tablet (10 mg total) by mouth daily.   isosorbide   mononitrate (IMDUR ) 30 MG 24 hr tablet Take 1 tablet (30 mg total) by mouth daily.   losartan  (COZAAR ) 50 MG tablet TAKE 1 TABLET(50 MG) BY MOUTH DAILY   metoprolol  tartrate (LOPRESSOR ) 50 MG tablet TAKE 1 TABLET BY MOUTH TWICE DAILY   Multiple Vitamin (MULTIVITAMIN WITH MINERALS) TABS tablet Take 1 tablet by mouth daily.   nitroGLYCERIN  (NITROSTAT ) 0.4 MG SL tablet place 1 tablet under the tongue if needed every 5 minutes for 3 doses if needed for chest pain   rosuvastatin  (CRESTOR ) 40 MG tablet Take 1 tablet (40 mg total) by mouth daily.     Allergies:   Other   ROS:   Please see the history of present illness.    All other systems reviewed and are negative.  EKGs/Labs/Other Studies Reviewed:    The following studies were reviewed today: Cardiac Studies & Procedures   ______________________________________________________________________________________________   STRESS TESTS  NM MYOCAR MULTI W/SPECT W 01/12/2015  Narrative  There was no ST segment deviation noted during stress.  No T wave inversion was noted during stress.  The study is normal.  This is a low risk study.  The left ventricular ejection fraction is normal (55-65%).  This study is significantly affected by artifacts causing decreased uptake in the basal and mid inferior and inferolateral walls and apical wall at  rest, however they all improve with stress.  There is no ischemia. Low risk study.   ECHOCARDIOGRAM  ECHOCARDIOGRAM COMPLETE 05/26/2022  Narrative ECHOCARDIOGRAM REPORT    Patient Name:   Wyatt Mason Date of Exam: 05/26/2022 Medical Rec #:  [id number]       Height:       72.0 in Accession #:    [id number]      Weight:       251.0 lb Date of Birth:  01/05/1933       BSA:          2.346 m Patient Age:    89 years        BP:           166/84 mmHg Patient Gender: M               HR:           80 bpm. Exam Location:  Outpatient  Procedure: 2D Echo, Cardiac Doppler and Color  Doppler  Indications:    I77.89 Aortic dilatation  History:        Patient has prior history of Echocardiogram examinations, most recent 06/11/2021. CAD, Arrythmias:RBBB; Risk Factors:Hypertension and HLD.  Sonographer:    Waldo Guadalajara RCS Referring Phys: [id number] SCOTT T WEAVER  IMPRESSIONS   1. Left ventricular ejection fraction, by estimation, is 60 to 65%. The left ventricle has normal function. The left ventricle has no regional wall motion abnormalities. Left ventricular diastolic parameters are consistent with Grade I diastolic dysfunction (impaired relaxation). 2. Right ventricular systolic function is normal. The right ventricular size is normal. There is normal pulmonary artery systolic pressure. 3. Left atrial size was mildly dilated. 4. Right atrial size was mildly dilated. 5. The mitral valve is normal in structure. Trivial mitral valve regurgitation. No evidence of mitral stenosis. 6. The aortic valve is tricuspid. There is mild calcification of the aortic valve. Aortic valve regurgitation is mild. No aortic stenosis is present. Aortic regurgitation PHT measures 1026 msec. 7. Aortic dilatation noted. There is mild dilatation of the ascending aorta, measuring 43 mm. 8. The inferior vena cava is normal in size with greater than 50% respiratory variability, suggesting right atrial pressure of 3 mmHg.  FINDINGS Left Ventricle: Left ventricular ejection fraction, by estimation, is 60 to 65%. The left ventricle has normal function. The left ventricle has no regional wall motion abnormalities. The left ventricular internal cavity size was normal in size. There is no left ventricular hypertrophy. Left ventricular diastolic parameters are consistent with Grade I diastolic dysfunction (impaired relaxation).  Right Ventricle: The right ventricular size is normal. No increase in right ventricular wall thickness. Right ventricular systolic function is normal. There is normal pulmonary artery  systolic pressure. The tricuspid regurgitant velocity is 2.29 m/s, and with an assumed right atrial pressure of 3 mmHg, the estimated right ventricular systolic pressure is 24.0 mmHg.  Left Atrium: Left atrial size was mildly dilated.  Right Atrium: Right atrial size was mildly dilated.  Pericardium: There is no evidence of pericardial effusion.  Mitral Valve: The mitral valve is normal in structure. Trivial mitral valve regurgitation. No evidence of mitral valve stenosis.  Tricuspid Valve: The tricuspid valve is normal in structure. Tricuspid valve regurgitation is trivial. No evidence of tricuspid stenosis.  Aortic Valve: The aortic valve is tricuspid. There is mild calcification of the aortic valve. Aortic valve regurgitation is mild. Aortic regurgitation PHT measures 1026 msec. No aortic stenosis is present.  Pulmonic Valve: The pulmonic  valve was normal in structure. Pulmonic valve regurgitation is not visualized. No evidence of pulmonic stenosis.  Aorta: Aortic dilatation noted. There is mild dilatation of the ascending aorta, measuring 43 mm.  Venous: The inferior vena cava is normal in size with greater than 50% respiratory variability, suggesting right atrial pressure of 3 mmHg.  IAS/Shunts: No atrial level shunt detected by color flow Doppler.   LEFT VENTRICLE PLAX 2D LVIDd:         4.70 cm   Diastology LVIDs:         3.40 cm   LV e' medial:    4.79 cm/s LV PW:         1.20 cm   LV E/e' medial:  13.2 LV IVS:        0.90 cm   LV e' lateral:   4.90 cm/s LVOT diam:     2.00 cm   LV E/e' lateral: 12.9 LV SV:         63 LV SV Index:   27 LVOT Area:     3.14 cm   RIGHT VENTRICLE RV Basal diam:  3.60 cm RV S prime:     14.60 cm/s RVSP:           24.0 mmHg  LEFT ATRIUM             Index        RIGHT ATRIUM           Index LA diam:        3.00 cm 1.28 cm/m   RA Pressure: 3.00 mmHg LA Vol (A2C):   70.6 ml 30.07 ml/m  RA Area:     15.20 cm LA Vol (A4C):   44.2 ml 18.84  ml/m  RA Volume:   39.20 ml  16.71 ml/m LA Biplane Vol: 56.3 ml 23.99 ml/m AORTIC VALVE LVOT Vmax:   99.90 cm/s LVOT Vmean:  80.400 cm/s LVOT VTI:    0.202 m AI PHT:      1026 msec  AORTA Ao Root diam: 3.30 cm Ao Asc diam:  4.30 cm  MITRAL VALVE                TRICUSPID VALVE TV Peak grad:   12.1 mmHg MV Decel Time:              TV Vmax:        1.74 m/s MV E velocity: 63.00 cm/s   TR Peak grad:   21.0 mmHg MV A velocity: 130.00 cm/s  TR Vmax:        229.00 cm/s MV E/A ratio:  0.48         Estimated RAP:  3.00 mmHg RVSP:           24.0 mmHg  SHUNTS Systemic VTI:  0.20 m Systemic Diam: 2.00 cm  Toribio Fuel MD Electronically signed by Toribio Fuel MD Signature Date/Time: 05/26/2022/2:55:24 PM    Final          ______________________________________________________________________________________________      EKG:        Recent Labs: 07/13/2023: ALT 13; BUN 15; Creatinine, Ser 0.74; Potassium 4.4; Sodium 141  Recent Lipid Panel    Component Value Date/Time   CHOL 203 (H) 07/13/2023 0900   TRIG 102 07/13/2023 0900   HDL 84 07/13/2023 0900   CHOLHDL 2.4 07/13/2023 0900   CHOLHDL 3 11/28/2018 1000   VLDL 20.2 11/28/2018 1000   LDLCALC 101 (H) 07/13/2023 0900  Physical Exam:    VS:  BP (!) 140/80   Pulse 72   Ht 6' (1.829 m)   Wt 240 lb 6.4 oz (109 kg)   SpO2 94%   BMI 32.60 kg/m     Wt Readings from Last 3 Encounters:  01/12/24 240 lb 6.4 oz (109 kg)  07/13/23 253 lb 6.4 oz (114.9 kg)  12/15/22 250 lb (113.4 kg)     GEN:  Well nourished, well developed in no acute distress HEENT: Normal NECK: No JVD; No carotid bruits LYMPHATICS: No lymphadenopathy CARDIAC: RRR, no murmurs, rubs, gallops RESPIRATORY:  Clear to auscultation without rales, wheezing or rhonchi  ABDOMEN: Soft, non-tender, non-distended MUSCULOSKELETAL:  No edema; No deformity  SKIN: Warm and dry NEUROLOGIC:  Alert and oriented x 3 PSYCHIATRIC:  Normal  affect   Assessment & Plan Coronary artery disease involving native coronary artery of native heart with angina pectoris (HCC) No anginal symptoms on amlodipine , isosorbide , and metoprolol .  He will continue on aspirin  81 mg daily.  Continue high intensity statin drug with rosuvastatin  40 mg daily. Mixed hyperlipidemia Continue rosuvastatin  and ezetimibe .  LDL is above goal at 101.  Patient is not interested in lipid clinic referral for injectable therapies at age 73.  Continue current management. Essential hypertension Greatly improved blood pressure control on amlodipine , isosorbide , losartan , and metoprolol . Nonrheumatic aortic valve insufficiency Mild AI from echo in 2024, reviewed, clinical follow-up recommended.  No diastolic murmur on exam Aneurysm of ascending aorta without rupture (HCC) In 2023 - CTA shows maximal diameter of the ascending aorta 4.2 cm, repeat in 2024 4.1 cm.  With very mild aortic dilatation at age 49, I do not think he requires annual surveillance at this time.  His aortic dimensions have been stable.     Medication Adjustments/Labs and Tests Ordered: Current medicines are reviewed at length with the patient today.  Concerns regarding medicines are outlined above.  No orders of the defined types were placed in this encounter.  Meds ordered this encounter  Medications   nitroGLYCERIN  (NITROSTAT ) 0.4 MG SL tablet    Sig: place 1 tablet under the tongue if needed every 5 minutes for 3 doses if needed for chest pain    Dispense:  25 tablet    Refill:  3   isosorbide  mononitrate (IMDUR ) 30 MG 24 hr tablet    Sig: Take 1 tablet (30 mg total) by mouth daily.    Dispense:  90 tablet    Refill:  3    Patient Instructions  Medication Instructions:  No medication changes were made at this visit. Continue current regimen.   *If you need a refill on your cardiac medications before your next appointment, please call your pharmacy*  Lab Work: None ordered today. If  you have labs (blood work) drawn today and your tests are completely normal, you will receive your results only by: MyChart Message (if you have MyChart) OR A paper copy in the mail If you have any lab test that is abnormal or we need to change your treatment, we will call you to review the results.  Testing/Procedures: None ordered today.  Follow-Up: At [facility], you and your health needs are our priority.  As part of our continuing mission to provide you with exceptional heart care, our providers are all part of one team.  This team includes your primary Cardiologist (physician) and Advanced Practice Providers or APPs (Physician Assistants and Nurse Practitioners) who all work together to provide you with  the care you need, when you need it.  Your next appointment:   1 year(s)  Provider:   Ozell Fell, MD      Signed, Ozell Fell, MD  01/12/2024 12:50 PM     [facility]

## 2024-01-12 NOTE — Assessment & Plan Note (Addendum)
 In 2023 - CTA shows maximal diameter of the ascending aorta 4.2 cm, repeat in 2024 4.1 cm.  With very mild aortic dilatation at age 89, I do not think he requires annual surveillance at this time.  His aortic dimensions have been stable.

## 2024-01-12 NOTE — Assessment & Plan Note (Addendum)
 Mild AI from echo in 2024, reviewed, clinical follow-up recommended.  No diastolic murmur on exam

## 2024-01-12 NOTE — Assessment & Plan Note (Signed)
 Greatly improved blood pressure control on amlodipine , isosorbide , losartan , and metoprolol .

## 2024-01-26 ENCOUNTER — Telehealth: Payer: Self-pay | Admitting: Cardiovascular Disease

## 2024-01-26 DIAGNOSIS — I25119 Atherosclerotic heart disease of native coronary artery with unspecified angina pectoris: Secondary | ICD-10-CM

## 2024-01-26 DIAGNOSIS — E782 Mixed hyperlipidemia: Secondary | ICD-10-CM

## 2024-01-26 DIAGNOSIS — I7121 Aneurysm of the ascending aorta, without rupture: Secondary | ICD-10-CM

## 2024-01-26 DIAGNOSIS — I10 Essential (primary) hypertension: Secondary | ICD-10-CM

## 2024-01-26 DIAGNOSIS — I351 Nonrheumatic aortic (valve) insufficiency: Secondary | ICD-10-CM

## 2024-01-26 MED ORDER — ISOSORBIDE MONONITRATE ER 30 MG PO TB24: 30.0000 mg | ORAL_TABLET | Freq: Every day | ORAL | 3 refills | Status: AC

## 2024-01-26 NOTE — Telephone Encounter (Signed)
*  STAT* If patient is at the pharmacy, call can be transferred to refill team.   1. Which medications need to be refilled? (please list name of each medication and dose if known) isosorbide  mononitrate (IMDUR ) 30 MG 24 hr tablet    2. Would you like to learn more about the convenience, safety, & potential cost savings by using the [facility]? No    3. Are you open to using the [facility] (Type [facility]. No    4. Which pharmacy/location (including street and city if local pharmacy) is medication to be sent to? [id number] - [place], [place] - [address]     5. Do they need a 30 day or 90 day supply? 90 day   Pt is out of medication.

## 2024-01-26 NOTE — Telephone Encounter (Signed)
 Pt's medication was sent to pt's pharmacy as requested. Confirmation received.

## 2024-02-08 ENCOUNTER — Ambulatory Visit: Admitting: Cardiovascular Disease

## 2024-03-06 ENCOUNTER — Telehealth: Payer: Self-pay

## 2024-03-06 NOTE — Telephone Encounter (Signed)
 [facility] listed as PCP for patient. LMOM for him to call. He will need to establish care.

## 2024-03-14 ENCOUNTER — Ambulatory Visit (INDEPENDENT_AMBULATORY_CARE_PROVIDER_SITE_OTHER): Admitting: Family Medicine

## 2024-03-14 ENCOUNTER — Encounter: Payer: Self-pay | Admitting: Family Medicine

## 2024-03-14 VITALS — BP 138/80 | HR 85 | Temp 97.9°F | Ht 72.0 in | Wt 241.4 lb

## 2024-03-14 DIAGNOSIS — G4700 Insomnia, unspecified: Secondary | ICD-10-CM

## 2024-03-14 DIAGNOSIS — R739 Hyperglycemia, unspecified: Secondary | ICD-10-CM

## 2024-03-14 DIAGNOSIS — I10 Essential (primary) hypertension: Secondary | ICD-10-CM

## 2024-03-14 DIAGNOSIS — E782 Mixed hyperlipidemia: Secondary | ICD-10-CM

## 2024-03-14 LAB — COMPREHENSIVE METABOLIC PANEL WITH GFR
ALT: 13 U/L (ref 0–53)
AST: 19 U/L (ref 0–37)
Albumin: 4.3 g/dL (ref 3.5–5.2)
Alkaline Phosphatase: 61 U/L (ref 39–117)
BUN: 18 mg/dL (ref 6–23)
CO2: 32 meq/L (ref 19–32)
Calcium: 8.8 mg/dL (ref 8.4–10.5)
Chloride: 100 meq/L (ref 96–112)
Creatinine, Ser: 0.73 mg/dL (ref 0.40–1.50)
GFR: 80.67 mL/min (ref >60.00)
Glucose, Bld: 102 mg/dL — ABNORMAL HIGH (ref 70–99)
Potassium: 4.3 meq/L (ref 3.5–5.1)
Sodium: 141 meq/L (ref 135–145)
Total Bilirubin: 1.1 mg/dL (ref 0.2–1.2)
Total Protein: 6.7 g/dL (ref 6.0–8.3)

## 2024-03-14 LAB — CBC
HCT: 38.6 % — ABNORMAL LOW (ref 39.0–52.0)
Hemoglobin: 12.8 g/dL — ABNORMAL LOW (ref 13.0–17.0)
MCHC: 33.2 g/dL (ref 30.0–36.0)
MCV: 94.6 fl (ref 78.0–100.0)
Platelets: 152 K/uL (ref 150.0–400.0)
RBC: 4.08 Mil/uL — ABNORMAL LOW (ref 4.22–5.81)
RDW: 14.5 % (ref 11.5–15.5)
WBC: 4.1 K/uL (ref 4.0–10.5)

## 2024-03-14 LAB — LIPID PANEL
Cholesterol: 234 mg/dL — ABNORMAL HIGH (ref 0–200)
HDL: 113.3 mg/dL (ref >39.00)
LDL Cholesterol: 110 mg/dL — ABNORMAL HIGH (ref 0–99)
NonHDL: 120.5
Total CHOL/HDL Ratio: 2
Triglycerides: 54 mg/dL (ref 0.0–149.0)
VLDL: 10.8 mg/dL (ref 0.0–40.0)

## 2024-03-14 LAB — TSH: TSH: 1.91 u[IU]/mL (ref 0.35–5.50)

## 2024-03-14 LAB — HEMOGLOBIN A1C: Hgb A1c MFr Bld: 5.2 % (ref 4.6–6.5)

## 2024-03-14 NOTE — Patient Instructions (Addendum)
 It was very nice to see you today!  VISIT SUMMARY: Today, you came in for a routine follow-up visit. We discussed your overall health, including your blood pressure, sleep issues, and general health maintenance.  YOUR PLAN: ESSENTIAL HYPERTENSION: Your blood pressure is well-managed with your current medication. -Continue taking your current blood pressure medication as prescribed by your cardiologist.  SLEEP DISTURBANCE: You have trouble sleeping and wake up early due to needing to urinate at night. -We discussed non-drug ways to improve your sleep. -If you want, you can try melatonin or magnesium supplements to help with sleep.  GENERAL HEALTH MAINTENANCE: We talked about your routine health maintenance. -You need to get blood work done to check your glucose levels, including an A1c test. -Keep getting your flu shot every year. -Even though you've had shingles before, it's a good idea to get the shingles vaccine.  Please try to incorporate the following into your daily routine:  1. Sleep only long enough to feel rested and then get out of bed  2. Go to bed and get up at the same time every day  3. Do not try to force yourself to sleep. If you can't sleep, get out of bed and try again later.  4. Have coffee, tea, and other foods that have caffeine only in the morning  5. Avoid alcohol in the late afternoon, evening, and bedtime  6. Avoid smoking, especially in the evening  7. Keep your bedroom dark, cool, quiet, and free of reminders of work or other things that cause you stress  8. Solve problems you have before you go to bed  9. Exercise several days a week, but not right before bed  10. Avoid looking at phones or reading devices (e-books) that give off light before bed. This can make it harder to fall asleep.   Return in about 1 year (around 03/14/2025) for Annual Physical.   Take care, Dr Kennyth  PLEASE NOTE:  If you had any lab tests, please let us  know if you  have not heard back within a few days. You may see your results on mychart before we have a chance to review them but we will give you a call once they are reviewed by us .   If we ordered any referrals today, please let us  know if you have not heard from their office within the next week.   If you had any urgent prescriptions sent in today, please check with the pharmacy within an hour of our visit to make sure the prescription was transmitted appropriately.   Please try these tips to maintain a healthy lifestyle:  Eat at least 3 REAL meals and 1-2 snacks per day.  Aim for no more than 5 hours between eating.  If you eat breakfast, please do so within one hour of getting up.   Each meal should contain half fruits/vegetables, one quarter protein, and one quarter carbs (no bigger than a computer mouse)  Cut down on sweet beverages. This includes juice, soda, and sweet tea.   Drink at least 1 glass of water with each meal and aim for at least 8 glasses per day  Exercise at least 150 minutes every week.

## 2024-03-14 NOTE — Assessment & Plan Note (Signed)
 Initially elevated but at goal on recheck.  He will continue regimen per cardiology with Imdur  30 mg daily, metoprolol  tartrate 50 mg twice daily, losartan  50 mg daily, and amlodipine  5 mg daily.

## 2024-03-14 NOTE — Assessment & Plan Note (Addendum)
 Patient with sleep maintenance insomnia.  Usually wakes up around 3 or 4 in the morning and has difficulty going back to sleep.  Usually has to wake up to urinate.  We did discuss sleep hygiene measures.  We did discuss medication options however he is not interested in this at this point.  He can try over-the-counter melatonin or magnesium supplements as well.  He will let us  know if he changes his mind.

## 2024-03-14 NOTE — Progress Notes (Signed)
   Wyatt Mason is a 89 y.o. male who presents today for an office visit.  Assessment/Plan:  New/Acute Problems: Fall  Patient fell a few months ago after losing balance.  Did not have any subsequent injuries from this.  We did discuss referral to see physical therapy however he declined.  He will let us  know if he changes his mind.  Chronic Problems Addressed Today: Hyperglycemia Check A1c with labs.  Last A1c was checked about 5 years ago.  HYPERLIPIDEMIA On Zetia  10 mg daily and Crestor  40 mg daily.  Check lipids with labs.   Insomnia Patient with sleep maintenance insomnia.  Usually wakes up around 3 or 4 in the morning and has difficulty going back to sleep.  Usually has to wake up to urinate.  We did discuss sleep hygiene measures.  We did discuss medication options however he is not interested in this at this point.  He can try over-the-counter melatonin or magnesium supplements as well.  He will let us  know if he changes his mind.  Essential hypertension Initially elevated but at goal on recheck.  He will continue regimen per cardiology with Imdur  30 mg daily, metoprolol  tartrate 50 mg twice daily, losartan  50 mg daily, and amlodipine  5 mg daily.     Subjective:  HPI:  See assessment / plan for status of chronic conditions.   Discussed the use of AI scribe software for clinical note transcription with the patient, who gave verbal consent to proceed.  History of Present Illness Wyatt Mason is an 89 year old male who presents for a routine follow-up visit.  He has been generally well since his last visit two years ago and continues to see his cardiologist annually. He had a myocardial infarction in 2014 and recently consulted with a clinical pharmacist regarding his medications.  He experiences difficulty sleeping, waking up around 5 AM due to nocturia, typically urinating once a night around 4 or 5 AM. He does not take any medication for sleep. He drinks a lot  of water and avoids sweet tea.  He has a history of falls, with a significant fall in 2022 or 2023 resulting in a compression fracture of the L1 vertebra. Another fall occurred 6-8 months ago. He uses a cane when necessary, such as when going to the grocery store.  He has lost weight, noting a decrease from 250 to 240 pounds. He is trying to eat more fruits and vegetables but is not very active due to difficulty with his feet. He uses a cane occasionally.  He has received his flu shot for the year at [facility] and has not opted for a COVID shot. His daughter recently had COVID followed by the flu, resulting in her being out of work for three weeks.         Objective:  Physical Exam: BP 138/80   Pulse 85   Temp 97.9 F (36.6 C) (Temporal)   Ht 6' (1.829 m)   Wt 241 lb 6.4 oz (109.5 kg)   SpO2 98%   BMI 32.74 kg/m   Gen: No acute distress, resting comfortably CV: Regular rate and rhythm with no murmurs appreciated Pulm: Normal work of breathing, clear to auscultation bilaterally with no crackles, wheezes, or rhonchi Neuro: Grossly normal, moves all extremities Psych: Normal affect and thought content      Wyatt Schoenberger M. Kennyth, MD 03/14/2024 10:24 AM

## 2024-03-14 NOTE — Assessment & Plan Note (Signed)
 Check A1c with labs.  Last A1c was checked about 5 years ago.

## 2024-03-14 NOTE — Assessment & Plan Note (Signed)
 On Zetia  10 mg daily and Crestor  40 mg daily.  Check lipids with labs.

## 2024-03-15 ENCOUNTER — Ambulatory Visit: Payer: Self-pay | Admitting: Family Medicine

## 2024-03-15 DIAGNOSIS — R739 Hyperglycemia, unspecified: Secondary | ICD-10-CM

## 2024-03-15 NOTE — Progress Notes (Signed)
 His hemoglobin is down just a little bit but near his baseline.  We should recheck this again in 3 to 6 months.  All of his other labs are stable and his A1c is at goal.  We can recheck in a year.

## 2024-03-15 NOTE — Telephone Encounter (Signed)
 Labs have been ordered

## 2024-03-15 NOTE — Addendum Note (Signed)
 Addended by: FRANCIS ROULEAU A on: 03/15/2024 02:47 PM   Modules accepted: Orders

## 2024-03-15 NOTE — Progress Notes (Signed)
 Please place order for CBC with differential

## 2024-04-03 ENCOUNTER — Telehealth: Payer: Self-pay | Admitting: Pharmacist

## 2024-04-03 DIAGNOSIS — I10 Essential (primary) hypertension: Secondary | ICD-10-CM

## 2024-04-03 DIAGNOSIS — I25119 Atherosclerotic heart disease of native coronary artery with unspecified angina pectoris: Secondary | ICD-10-CM

## 2024-04-03 DIAGNOSIS — E782 Mixed hyperlipidemia: Secondary | ICD-10-CM

## 2024-04-03 DIAGNOSIS — Z79899 Other long term (current) drug therapy: Secondary | ICD-10-CM

## 2024-04-03 NOTE — Telephone Encounter (Signed)
 Pharmacy Quality Measure Review  This patient is appearing on a report for being at risk of failing the adherence measure for hypertension (ACEi/ARB) medications this calendar year.   Medication: losartan  Last fill date: 12/27/2023 for 90 day supply  BP Readings from Last 3 Encounters:  03/14/24 138/80  01/12/24 (!) 140/80  07/13/23 (!) 150/82    Spoke to Wyatt Mason today. He got his medication bottles while on the phone today and we reviewed each medication and how to take. He states he only needed refills for ezetimibe  and rosuvastatin .  It looks like he will need updated prescriptions for both of these.  Will coordinate with Dr Kennyth or cardiology office / Dr Wonda to send in Rx's for ezetimibe  and rosuvastatin .   Madelin Ray, PharmD Clinical Pharmacist [facility]  Population Health [phone]

## 2024-04-04 MED ORDER — ROSUVASTATIN CALCIUM 40 MG PO TABS: 40.0000 mg | ORAL_TABLET | Freq: Every day | ORAL | 1 refills | Status: AC

## 2024-04-04 MED ORDER — EZETIMIBE 10 MG PO TABS: 10.0000 mg | ORAL_TABLET | Freq: Every day | ORAL | 1 refills | Status: AC
